# Patient Record
Sex: Female | Born: 1970 | Race: White | Hispanic: Yes | Smoking: Never smoker
Health system: Southern US, Community
[De-identification: ages and names within clinical notes are randomized; demographics above are authoritative.]

## PROBLEM LIST (undated history)

## (undated) DIAGNOSIS — E785 Hyperlipidemia, unspecified: Secondary | ICD-10-CM

## (undated) DIAGNOSIS — O1420 HELLP syndrome (HELLP), unspecified trimester: Secondary | ICD-10-CM

## (undated) DIAGNOSIS — F410 Panic disorder [episodic paroxysmal anxiety] without agoraphobia: Secondary | ICD-10-CM

## (undated) DIAGNOSIS — K859 Acute pancreatitis without necrosis or infection, unspecified: Secondary | ICD-10-CM

## (undated) DIAGNOSIS — A048 Other specified bacterial intestinal infections: Secondary | ICD-10-CM

## (undated) DIAGNOSIS — E039 Hypothyroidism, unspecified: Secondary | ICD-10-CM

## (undated) HISTORY — DX: Hypothyroidism, unspecified: E03.9

## (undated) HISTORY — DX: Panic disorder (episodic paroxysmal anxiety): F41.0

## (undated) HISTORY — DX: HELLP syndrome (HELLP), unspecified trimester: O14.20

## (undated) HISTORY — DX: Hyperlipidemia, unspecified: E78.5

## (undated) HISTORY — DX: Acute pancreatitis without necrosis or infection, unspecified: K85.90

---
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
## 1999-03-26 HISTORY — PX: APPENDECTOMY: SHX54

## 2005-07-31 ENCOUNTER — Other Ambulatory Visit: Admission: RE | Admit: 2005-07-31 | Discharge: 2005-07-31 | Payer: Self-pay | Admitting: Obstetrics and Gynecology

## 2005-08-14 ENCOUNTER — Inpatient Hospital Stay (HOSPITAL_COMMUNITY): Admission: AD | Admit: 2005-08-14 | Discharge: 2005-08-14 | Payer: Self-pay | Admitting: Obstetrics and Gynecology

## 2006-01-31 ENCOUNTER — Inpatient Hospital Stay (HOSPITAL_COMMUNITY): Admission: AD | Admit: 2006-01-31 | Discharge: 2006-02-06 | Payer: Self-pay | Admitting: Obstetrics and Gynecology

## 2006-02-01 ENCOUNTER — Encounter (INDEPENDENT_AMBULATORY_CARE_PROVIDER_SITE_OTHER): Payer: Self-pay | Admitting: Specialist

## 2006-02-07 ENCOUNTER — Encounter: Admission: RE | Admit: 2006-02-07 | Discharge: 2006-03-09 | Payer: Self-pay | Admitting: Obstetrics and Gynecology

## 2006-03-19 ENCOUNTER — Ambulatory Visit: Payer: Self-pay | Admitting: Family Medicine

## 2006-03-19 LAB — CONVERTED CEMR LAB
Albumin: 3.7 g/dL (ref 3.5–5.2)
Alkaline Phosphatase: 94 units/L (ref 39–117)
HCT: 35.7 % — ABNORMAL LOW (ref 36.0–46.0)
Hemoglobin: 12.8 g/dL (ref 12.0–15.0)
Lipase: 41 units/L (ref 11.0–59.0)
MCHC: 35.7 g/dL (ref 30.0–36.0)
MCV: 93.1 fL (ref 78.0–100.0)
Platelets: 392 10*3/uL (ref 150–400)
RBC: 3.83 M/uL — ABNORMAL LOW (ref 3.87–5.11)
RDW: 11.2 % — ABNORMAL LOW (ref 11.5–14.6)

## 2006-03-21 ENCOUNTER — Ambulatory Visit: Payer: Self-pay | Admitting: Internal Medicine

## 2006-04-08 ENCOUNTER — Encounter: Payer: Self-pay | Admitting: Internal Medicine

## 2006-04-28 ENCOUNTER — Encounter: Payer: Self-pay | Admitting: Internal Medicine

## 2006-07-28 ENCOUNTER — Encounter: Payer: Self-pay | Admitting: Internal Medicine

## 2006-10-28 DIAGNOSIS — E039 Hypothyroidism, unspecified: Secondary | ICD-10-CM | POA: Insufficient documentation

## 2006-12-05 ENCOUNTER — Ambulatory Visit: Payer: Self-pay | Admitting: Internal Medicine

## 2006-12-05 LAB — CONVERTED CEMR LAB
Blood in Urine, dipstick: NEGATIVE
Nitrite: NEGATIVE
Protein, U semiquant: NEGATIVE
Specific Gravity, Urine: 1.02
Urobilinogen, UA: NEGATIVE
WBC Urine, dipstick: NEGATIVE

## 2006-12-08 LAB — CONVERTED CEMR LAB
AST: 19 units/L (ref 0–37)
Albumin: 3.9 g/dL (ref 3.5–5.2)
Basophils Relative: 0.6 % (ref 0.0–1.0)
Bilirubin, Direct: 0.2 mg/dL (ref 0.0–0.3)
Cholesterol: 209 mg/dL (ref 0–200)
Eosinophils Absolute: 0.3 10*3/uL (ref 0.0–0.6)
GFR calc Af Amer: 121 mL/min
GFR calc non Af Amer: 100 mL/min
Hemoglobin: 12.7 g/dL (ref 12.0–15.0)
Monocytes Absolute: 0.7 10*3/uL (ref 0.2–0.7)
Neutro Abs: 6.2 10*3/uL (ref 1.4–7.7)
Neutrophils Relative %: 68.6 % (ref 43.0–77.0)
Platelets: 336 10*3/uL (ref 150–400)
Potassium: 5.2 meq/L — ABNORMAL HIGH (ref 3.5–5.1)
RDW: 11.4 % — ABNORMAL LOW (ref 11.5–14.6)
Sodium: 142 meq/L (ref 135–145)
Total Bilirubin: 1.2 mg/dL (ref 0.3–1.2)
Total CHOL/HDL Ratio: 6.5
Total Protein: 6.8 g/dL (ref 6.0–8.3)

## 2006-12-30 ENCOUNTER — Ambulatory Visit: Payer: Self-pay | Admitting: Family Medicine

## 2007-08-26 ENCOUNTER — Encounter: Payer: Self-pay | Admitting: Internal Medicine

## 2007-08-28 ENCOUNTER — Encounter: Admission: RE | Admit: 2007-08-28 | Discharge: 2007-08-28 | Payer: Self-pay | Admitting: Gastroenterology

## 2007-09-18 ENCOUNTER — Encounter: Payer: Self-pay | Admitting: Internal Medicine

## 2007-11-10 ENCOUNTER — Telehealth: Payer: Self-pay | Admitting: Internal Medicine

## 2007-12-11 ENCOUNTER — Ambulatory Visit: Payer: Self-pay | Admitting: Internal Medicine

## 2007-12-11 LAB — CONVERTED CEMR LAB
ALT: 13 units/L (ref 0–35)
Albumin: 3.9 g/dL (ref 3.5–5.2)
Alkaline Phosphatase: 48 units/L (ref 39–117)
Basophils Relative: 0.7 % (ref 0.0–3.0)
Bilirubin, Direct: 0.2 mg/dL (ref 0.0–0.3)
Cholesterol: 186 mg/dL (ref 0–200)
Creatinine, Ser: 0.7 mg/dL (ref 0.4–1.2)
Eosinophils Relative: 3.7 % (ref 0.0–5.0)
HCT: 34.6 % — ABNORMAL LOW (ref 36.0–46.0)
HDL: 40.5 mg/dL (ref >39.0)
Monocytes Relative: 7.9 % (ref 3.0–12.0)
Neutro Abs: 4.9 10*3/uL (ref 1.4–7.7)
Nitrite: NEGATIVE
Platelets: 286 10*3/uL (ref 150–400)
Potassium: 4.3 meq/L (ref 3.5–5.1)
RBC: 3.77 M/uL — ABNORMAL LOW (ref 3.87–5.11)
RDW: 11.3 % — ABNORMAL LOW (ref 11.5–14.6)
Sodium: 142 meq/L (ref 135–145)
TSH: 1.85 microintl units/mL (ref 0.35–5.50)
Total Protein: 7.1 g/dL (ref 6.0–8.3)
Triglycerides: 56 mg/dL (ref 0–149)
Urobilinogen, UA: 0.2
VLDL: 11 mg/dL (ref 0–40)
WBC Urine, dipstick: NEGATIVE

## 2007-12-18 ENCOUNTER — Ambulatory Visit: Payer: Self-pay | Admitting: Internal Medicine

## 2007-12-18 DIAGNOSIS — Z9189 Other specified personal risk factors, not elsewhere classified: Secondary | ICD-10-CM

## 2007-12-18 DIAGNOSIS — L819 Disorder of pigmentation, unspecified: Secondary | ICD-10-CM

## 2008-03-28 ENCOUNTER — Telehealth: Payer: Self-pay | Admitting: Internal Medicine

## 2008-03-30 ENCOUNTER — Ambulatory Visit: Payer: Self-pay | Admitting: Internal Medicine

## 2008-03-30 DIAGNOSIS — M542 Cervicalgia: Secondary | ICD-10-CM | POA: Insufficient documentation

## 2008-03-30 DIAGNOSIS — F410 Panic disorder [episodic paroxysmal anxiety] without agoraphobia: Secondary | ICD-10-CM

## 2008-04-27 ENCOUNTER — Ambulatory Visit: Payer: Self-pay | Admitting: Internal Medicine

## 2008-05-17 ENCOUNTER — Ambulatory Visit: Payer: Self-pay | Admitting: Internal Medicine

## 2008-07-28 ENCOUNTER — Ambulatory Visit: Payer: Self-pay | Admitting: Internal Medicine

## 2008-08-05 LAB — CONVERTED CEMR LAB: Free T4: 0.7 ng/dL (ref 0.6–1.6)

## 2008-08-25 ENCOUNTER — Ambulatory Visit: Payer: Self-pay | Admitting: Internal Medicine

## 2008-08-26 ENCOUNTER — Encounter: Payer: Self-pay | Admitting: Internal Medicine

## 2009-01-09 ENCOUNTER — Ambulatory Visit: Payer: Self-pay | Admitting: Internal Medicine

## 2009-07-27 ENCOUNTER — Ambulatory Visit: Payer: Self-pay | Admitting: Internal Medicine

## 2009-07-27 DIAGNOSIS — M19049 Primary osteoarthritis, unspecified hand: Secondary | ICD-10-CM | POA: Insufficient documentation

## 2009-07-27 DIAGNOSIS — M549 Dorsalgia, unspecified: Secondary | ICD-10-CM | POA: Insufficient documentation

## 2009-07-27 LAB — CONVERTED CEMR LAB: Anti Nuclear Antibody(ANA): NEGATIVE

## 2009-07-28 ENCOUNTER — Ambulatory Visit: Payer: Self-pay | Admitting: Internal Medicine

## 2009-08-04 ENCOUNTER — Telehealth: Payer: Self-pay | Admitting: *Deleted

## 2009-08-04 LAB — CONVERTED CEMR LAB
ALT: 14 units/L (ref 0–35)
Albumin: 4.1 g/dL (ref 3.5–5.2)
Alkaline Phosphatase: 62 units/L (ref 39–117)
Basophils Absolute: 0 10*3/uL (ref 0.0–0.1)
Bilirubin, Direct: 0.1 mg/dL (ref 0.0–0.3)
Chloride: 104 meq/L (ref 96–112)
Eosinophils Absolute: 0.2 10*3/uL (ref 0.0–0.7)
Eosinophils Relative: 2.2 % (ref 0.0–5.0)
Lymphs Abs: 1.7 10*3/uL (ref 0.7–4.0)
Monocytes Absolute: 0.6 10*3/uL (ref 0.1–1.0)
Neutro Abs: 6.6 10*3/uL (ref 1.4–7.7)
Potassium: 4.8 meq/L (ref 3.5–5.1)
RBC: 4.06 M/uL (ref 3.87–5.11)
RDW: 12.5 % (ref 11.5–14.6)
Rhuematoid fact SerPl-aCnc: 23.9 intl units/mL — ABNORMAL HIGH (ref 0.0–20.0)
Total Protein: 7.3 g/dL (ref 6.0–8.3)

## 2009-09-18 ENCOUNTER — Ambulatory Visit: Payer: Self-pay | Admitting: Internal Medicine

## 2009-09-18 LAB — CONVERTED CEMR LAB
AST: 23 units/L (ref 0–37)
Alkaline Phosphatase: 56 units/L (ref 39–117)
BUN: 13 mg/dL (ref 6–23)
Basophils Relative: 0.5 % (ref 0.0–3.0)
Bilirubin Urine: NEGATIVE
Bilirubin, Direct: 0.1 mg/dL (ref 0.0–0.3)
Blood in Urine, dipstick: NEGATIVE
CO2: 27 meq/L (ref 19–32)
Chloride: 106 meq/L (ref 96–112)
Cholesterol: 222 mg/dL — ABNORMAL HIGH (ref 0–200)
GFR calc non Af Amer: 111.26 mL/min (ref >60)
Glucose, Urine, Semiquant: NEGATIVE
HCT: 36.1 % (ref 36.0–46.0)
Hemoglobin: 12.7 g/dL (ref 12.0–15.0)
Ketones, urine, test strip: NEGATIVE
Lymphs Abs: 2 10*3/uL (ref 0.7–4.0)
Monocytes Absolute: 0.7 10*3/uL (ref 0.1–1.0)
Monocytes Relative: 9.1 % (ref 3.0–12.0)
Neutrophils Relative %: 64 % (ref 43.0–77.0)
Potassium: 4.7 meq/L (ref 3.5–5.1)
Protein, U semiquant: NEGATIVE
Total CHOL/HDL Ratio: 5
Total Protein: 7.1 g/dL (ref 6.0–8.3)
VLDL: 21.6 mg/dL (ref 0.0–40.0)
WBC: 8.1 10*3/uL (ref 4.5–10.5)

## 2009-09-27 ENCOUNTER — Ambulatory Visit: Payer: Self-pay | Admitting: Internal Medicine

## 2009-09-27 DIAGNOSIS — E785 Hyperlipidemia, unspecified: Secondary | ICD-10-CM | POA: Insufficient documentation

## 2010-01-18 ENCOUNTER — Ambulatory Visit: Payer: Self-pay | Admitting: Internal Medicine

## 2010-01-18 DIAGNOSIS — J039 Acute tonsillitis, unspecified: Secondary | ICD-10-CM | POA: Insufficient documentation

## 2010-01-18 DIAGNOSIS — J029 Acute pharyngitis, unspecified: Secondary | ICD-10-CM | POA: Insufficient documentation

## 2010-01-19 ENCOUNTER — Encounter: Payer: Self-pay | Admitting: Internal Medicine

## 2010-01-23 ENCOUNTER — Ambulatory Visit: Payer: Self-pay | Admitting: Internal Medicine

## 2010-01-29 LAB — CONVERTED CEMR LAB
Basophils Relative: 0.4 % (ref 0.0–3.0)
HCT: 35 % — ABNORMAL LOW (ref 36.0–46.0)
HDL: 41.4 mg/dL (ref >39.00)
Hemoglobin: 12 g/dL (ref 12.0–15.0)
Lymphocytes Relative: 11.1 % — ABNORMAL LOW (ref 12.0–46.0)
Monocytes Absolute: 0.8 10*3/uL (ref 0.1–1.0)
Neutro Abs: 10.2 10*3/uL — ABNORMAL HIGH (ref 1.4–7.7)
Neutrophils Relative %: 80.7 % — ABNORMAL HIGH (ref 43.0–77.0)
Platelets: 310 10*3/uL (ref 150.0–400.0)
RBC: 3.82 M/uL — ABNORMAL LOW (ref 3.87–5.11)
TSH: 2.45 microintl units/mL (ref 0.35–5.50)
Total CHOL/HDL Ratio: 5
VLDL: 20.4 mg/dL (ref 0.0–40.0)

## 2010-04-24 NOTE — Progress Notes (Signed)
Summary: Returning Call  Phone Note Call from Patient Call back at Work Phone [phone]   Caller: Patient Summary of Call: Returning phone call,  can you please call my husband and talk to him. Initial call taken by: Trixie Dredge,  Aug 04, 2009 10:48 AM  Follow-up for Phone Call        Pt's husband aware of results.  Follow-up by: Romualdo Bolk, CMA (AAMA),  Aug 04, 2009 2:20 PM

## 2010-04-24 NOTE — Assessment & Plan Note (Signed)
Summary: SORE THROAT // RS   Vital Signs:  Patient profile:   40 year old female Menstrual status:  regular LMP:     01/04/2010 Weight:      138 pounds Temp:     99.4 degrees F oral Pulse rate:   60 / minute BP sitting:   100 / 70  (left arm) Cuff size:   regular  Vitals Entered By: Romualdo Bolk, CMA (AAMA) (January 18, 2010 10:44 AM) CC: Sore throat, hurts to swollow, bilateral ear pain, ha, coughing and congestion, no fever. This started on 10/26. LMP (date): 01/04/2010 LMP - Character: normal Menarche (age onset years): 13   Menses interval (days): 28 Menstrual flow (days): 2-3 Enter LMP: 01/04/2010   History of Present Illness: Karen Fleming   comes in today  for  acute visit  for above.  onset about 24 hours or less  sore throat that is hard to swallow and  very painful  with some nasal congestion but no sig cough.  Children at home runny nose no strep exposure .  No abd pain rash  . took advil some help.  Due for thyroid and lipid check   . Need refill lexapro.    taking 5 every other day and helping  but wants to stay on this for a while.  no panic attack   currently  and no need for rescue med.   Preventive Screening-Counseling & Management  Alcohol-Tobacco     Alcohol drinks/day: 0     Smoking Status: never  Caffeine-Diet-Exercise     Caffeine use/day: none     Does Patient Exercise: yes     Type of exercise: swim, run     Times/week: 3  Current Medications (verified): 1)  Levothyroxine Sodium 50 Mcg  Tabs (Levothyroxine Sodium) .... Take 1 Tablet By Mouth Once A Day  Taking Every Other Day  7 2011 2)  Lexapro 10 Mg Tabs (Escitalopram Oxalate) .... 1/2 Every Other Day  Allergies (verified): No Known Drug Allergies  Past History:  Past medical, surgical, family and social histories (including risk factors) reviewed, and no changes noted (except as noted below).  Past Medical History: Reviewed history from 05/17/2008 and no changes  required. Hypothyroidism Hellp  in pregnancy   c section 11/10 endoscopy 04 reflux esophagitis Hx panic attacks on meds pre pregnancy   CONSULTANTS  [facility]  eval for  ruq pain    [facility]   Past Surgical History: Reviewed history from 05/17/2008 and no changes required. Appendectomy  2001 pancreatitis 2000, 2005    Past History:  Care Management: Gynecology: [facility] G[facility]ology: [facility] Psychologist: Seen but doesn't work for her-unsure of the name of the person  Family History: Reviewed history from 09/27/2009 and no changes required. gm and gf    DM  HBP Parent well  bros and sis 4 well.  GP  with arthritis ? OA    Father  Mi 74     Social History: Reviewed history from 07/27/2009 and no changes required. hh of 3    young child  at home no pets no ets. no caffeine    or etoh. drink milk 2-3 x perday  no reg exercise. [place]    Review of Systems       The patient complains of anorexia.  The patient denies decreased hearing, hoarseness, chest pain, peripheral edema, headaches, abdominal pain, abnormal bleeding, and angioedema.         achy myalgias   Physical  Exam  General:  mildy ill in nad   Head:  Normocephalic and atraumatic without obvious abnormalities. No apparent alopecia or balding. Eyes:  vision grossly intact, pupils equal, and pupils round.   Ears:  R ear normal, L ear normal, and no external deformities.   Nose:  no external deformity and no external erythema.  congested no face pain Mouth:  tonsil 1 + tiny exudate left  milderythema an no edema  Neck:  1+ ac nodes tender shoddy pc  Lungs:  Normal respiratory effort, chest expands symmetrically. Lungs are clear to auscultation, no crackles or wheezes. Heart:  Normal rate and regular rhythm. S1 and S2 normal without gallop, murmur, click, rub or other extra sounds. Pulses:  nl cap refill  Neurologic:  non focal  Skin:  turgor normal, color normal, no ecchymoses, and no  petechiae.   Cervical Nodes:  shoddy pc 1+ ac and tender Psych:  Oriented X3, normally interactive, good eye contact, not anxious appearing, and not depressed appearing.     Impression & Recommendations:  Problem # 1:  ACUTE TONSILLITIS (ICD-463) with congestion     probably viral but need to R/O strep by context and exam.   consider mono if persistent or  progressive    Problem # 2:  PANIC DISORDER (ICD-300.01) Assessment: Improved on every other day meds and want to stay on this for now and doing well   .    wil continue    Lexapro 10 Mg Tabs (Escitalopram oxalate) .Marland Kitchen... 1/2 every other day  Problem # 3:  HYPOTHYROIDISM (ICD-244.9) due for labs  Her updated medication list for this problem includes:    Levothyroxine Sodium 50 Mcg Tabs (Levothyroxine sodium) .Marland Kitchen... Take 1 tablet by mouth once a day  taking every other day  7 2011  Labs Reviewed: TSH: 2.22 (09/18/2009)    Chol: 222 (09/18/2009)   HDL: 47.80 (09/18/2009)   LDL: 134 (12/11/2007)   TG: 108.0 (09/18/2009)  Problem # 4:  HYPERLIPIDEMIA (ICD-272.4)  lifestyle intervention  repeat  Labs Reviewed: SGOT: 23 (09/18/2009)   SGPT: 13 (09/18/2009)   HDL:47.80 (09/18/2009), 40.5 (12/11/2007)  LDL:134 (12/11/2007), DEL (12/05/2006)  Chol:222 (09/18/2009), 186 (12/11/2007)  Trig:108.0 (09/18/2009), 56 (12/11/2007)  Complete Medication List: 1)  Levothyroxine Sodium 50 Mcg Tabs (Levothyroxine sodium) .... Take 1 tablet by mouth once a day  taking every other day  7 2011 2)  Lexapro 10 Mg Tabs (Escitalopram oxalate) .... 1/2 every other day 3)  Lorazepam 0.5 Mg Tabs (Lorazepam) .Marland Kitchen.. 1-2 to 1 by mouth two times a day for anxiety  Other Orders: Rapid Strep (16109) T-Culture, Throat ([id number])  Patient Instructions: 1)  You will be informed of lab results when available.  2)  use advil or tylenol in the meantime for signs  3)  If your strep test is positive we will start antibiotic.  4)  Fasting lipids and TSH     cbcdiff     dx   elevated lipids and hypothyroid  and tonsillitis  Prescriptions: LEXAPRO 10 MG TABS (ESCITALOPRAM OXALATE) 1/2 every other day  #30 x 1   Entered and Authorized by:   Madelin Headings MD   Signed by:   Madelin Headings MD on 01/18/2010   Method used:   Electronically to        [facility]  [place] [address] (retail)       [address]       [place], [place]  [address]  Ph: [phone] or [phone]       Fax: [phone]   RxID:   [id number] LORAZEPAM 0.5 MG TABS (LORAZEPAM) 1-2 to 1 by mouth two times a day for anxiety  #30 x 0   Entered and Authorized by:   Madelin Headings MD   Signed by:   Madelin Headings MD on 01/18/2010   Method used:   Print then Give to Patient   RxID:   [id number]   The lorazepam as above was written in error and not given to the patient . shredded. Madelin Headings MD  January 18, 2010 11:31 AM  Orders Added: 1)  Rapid Strep [87880] 2)  T-Culture, Throat [[id number]] 3)  Est. Patient Level IV [32951]    Laboratory Results    Other Tests  Rapid Strep: negative Comments: Rita Ohara  January 18, 2010 11:08 AM   Kit Test Internal QC: Negative   (Normal Range: Negative)

## 2010-04-24 NOTE — Letter (Signed)
Summary: [facility]   Imported By: Maryln Gottron 09/14/2009 15:18:10  _____________________________________________________________________  External Attachment:    Type:   Image     Comment:   External Document

## 2010-04-24 NOTE — Assessment & Plan Note (Signed)
Summary: med check/refill/cjr   Vital Signs:  Patient profile:   40 year old female Menstrual status:  regular LMP:     07/11/2009 Weight:      138 pounds Pulse rate:   66 / minute BP sitting:   100 / 60  (left arm) Cuff size:   regular  Vitals Entered By: Romualdo Bolk, CMA (AAMA) (Jul 27, 2009 9:49 AM) CC: Follow-up visit on meds and pt also wants to discuss joints in both hands hurt and left index finger is swollen LMP (date): 07/11/2009 LMP - Character: normal Menarche (age onset years): 13   Menses interval (days): 28 Menstrual flow (days): 2-3 Enter LMP: 07/11/2009   History of Present Illness: Karen Fleming comesin comes in today  for follow up of medical problems. # Anxiety panic : doing rather well  has decreased to 5 mg for about amonth.   slept better a t lower dose   #   Low back pan at night and not in day and no assoicated sx  for months.  Also for the past month or so she has had pain  and stiffness in fingers and  more on left index finger with some swelling.  No meds for this.  # thyroid : no change in meds doing well . no hair changes . exercises regularly  at gym running on treadmill.    Preventive Screening-Counseling & Management  Alcohol-Tobacco     Alcohol drinks/day: 0     Smoking Status: never  Caffeine-Diet-Exercise     Caffeine use/day: 0     Does Patient Exercise: yes  Current Medications (verified): 1)  Levothyroxine Sodium 50 Mcg  Tabs (Levothyroxine Sodium) .... Take 1 Tablet By Mouth Once A Day 2)  Lexapro 10 Mg Tabs (Escitalopram Oxalate) .Marland Kitchen.. 1 By Mouth Once Daily  or As Directed  Allergies (verified): No Known Drug Allergies  Past History:  Past medical, surgical, family and social histories (including risk factors) reviewed, and no changes noted (except as noted below).  Past Medical History: Reviewed history from 05/17/2008 and no changes required. Hypothyroidism Hellp  in pregnancy   c section 11/10 endoscopy 04  reflux esophagitis Hx panic attacks on meds pre pregnancy   CONSULTANTS  [facility]  eval for  ruq pain    [facility]   Past Surgical History: Reviewed history from 05/17/2008 and no changes required. Appendectomy  2001 pancreatitis 2000, 2005    Past History:  Care Management: Gynecology: [facility]ology: [facility] Psychologist: Seen but doesn't work for her-unsure of the name of the person  Family History: Reviewed history from 05/17/2008 and no changes required. gm and gf    DM  HBP Parent well  bros and sis 4 well.  GP  with arthritis ? OA   Social History: Reviewed history from 05/17/2008 and no changes required. hh of 3    young child  at home no pets no ets. no caffeine    or etoh. drink milk 2-3 x perday  no reg exercise. [place]    Review of Systems  The patient denies anorexia, fever, weight loss, weight gain, vision loss, decreased hearing, hoarseness, chest pain, syncope, prolonged cough, abdominal pain, melena, muscle weakness, transient blindness, difficulty walking, depression, abnormal bleeding, enlarged lymph nodes, and angioedema.    Physical Exam  General:  Well-developed,well-nourished,in no acute distress; alert,appropriate and cooperative throughout examination Head:  normocephalic and atraumatic.   Eyes:  vision grossly intact, pupils equal, and pupils round.  glasses  Ears:  R ear normal and L ear normal.   Mouth:  pharynx pink and moist.  tongue midline Neck:  No deformities, masses, or tenderness noted. Lungs:  Normal respiratory effort, chest expands symmetrically. Lungs are clear to auscultation, no crackles or wheezes. Heart:  Normal rate and regular rhythm. S1 and S2 normal without gallop, murmur, click, rub or other extra sounds. Abdomen:  Bowel sounds positive,abdomen soft and non-tender without masses, organomegaly or  noted. Msk:  no joint warmth and no redness over joints.  see below.  Pulses:  pulses intact without  delay   Extremities:  lef t hand with swelling left index distal  with / nodule  ? faint redness at  base of nails  but no rash and no warmth or effustion Neurologic:  alert & oriented X3, strength normal in all extremities, and gait normal.   Skin:  turgor normal, color normal, no ecchymoses, no petechiae, and no purpura.   Cervical Nodes:  No lymphadenopathy noted Psych:  Oriented X3, normally interactive, good eye contact, not anxious appearing, and not depressed appearing.     Impression & Recommendations:  Problem # 1:  HYPOTHYROIDISM (ICD-244.9)  Her updated medication list for this problem includes:    Levothyroxine Sodium 50 Mcg Tabs (Levothyroxine sodium) .Marland Kitchen... 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 early OA   young for this but some  type of arthritis  runs in family   Orders: TLB-CBC Platelet - w/Differential (85025-CBCD) TLB-CRP-High Sensitivity (C-Reactive Protein) (86140-FCRP) T-Antinuclear Antib (ANA) (30865-78469) TLB-Rheumatoid Factor (RA) (62952-WU) T-HLA B27 (83890/83898-83410) TLB-BMP (Basic Metabolic Panel-BMET) (80048-METABOL) TLB-Hepatic/Liver Function Pnl (80076-HEPATIC) T-Hand Right 3 views (73130TC) T-Hand Left 3 Views (73130TC) T-Lumbar Spine Complete, 5 Views (71110TC) Sedimentation Rate, non-automated (13244) Venipuncture (01027)  Problem # 3:  BACK PAIN (ICD-724.5) only nocturnal without other   signs except hand signs   .    no radiation   No colitis   . will continue to exercise  Orders: TLB-CBC Platelet - w/Differential (85025-CBCD) TLB-CRP-High Sensitivity (C-Reactive Protein) (86140-FCRP) T-Antinuclear Antib (ANA) (25366-44034) TLB-Rheumatoid Factor (RA) (74259-DG) T-HLA B27 (83890/83898-83410) TLB-BMP (Basic Metabolic Panel-BMET) (80048-METABOL) TLB-Hepatic/Liver Function Pnl (80076-HEPATIC) T-Lumbar Spine  Complete, 5 Views (71110TC) Sedimentation Rate, non-automated (38756) Venipuncture (43329)  Problem # 4:  PANIC DISORDER (ICD-300.01) Assessment: Improved ok to stay on weaned dose.   5 mg for a while and then can alterndate dose  Her updated medication list for this problem includes:    Lexapro 10 Mg Tabs (Escitalopram oxalate) .Marland Kitchen... 1 by mouth once daily  or as directed  Complete Medication List: 1)  Levothyroxine Sodium 50 Mcg Tabs (Levothyroxine sodium) .... Take 1 tablet by mouth once a day 2)  Lexapro 10 Mg Tabs (Escitalopram oxalate) .Marland Kitchen.. 1 by mouth once daily  or as directed  Patient Instructions: 1)  Stay on lexapro 5 mg  daily  for the next month. 2)  then can try alternating none with 5 mg   for a month and can try stopping. If anxiety recurrs then go back to 5 mg per day   . 3)  Get x rays done.     4)  You will be informed of lab results when available.  5)  can take ibuprofen 400-600 every 8 hours if needed for pain . 6)  if persistent or  progressive   over the next 2 months  then  call for rheumatologic assessment  referral .  Laboratory Results   Blood Tests   Date/Time Recieved: Jul 27, 2009 11:19 AM  Date/Time Reported: Jul 27, 2009 11:19 AM   SED rate: 22  Comments: Wynona Canes, CMA  Jul 27, 2009 11:19 AM

## 2010-04-24 NOTE — Assessment & Plan Note (Signed)
Summary: cpx/cjr   Vital Signs:  Patient profile:   40 year old female Menstrual status:  regular Height:      62 inches Weight:      139 pounds BMI:     25.52 BP sitting:   94 / 60  (left arm) Cuff size:   regular  Vitals Entered By: Raechel Ache, RN (September 27, 2009 9:33 AM) CC: CPX, labs done. Sees gyn.   History of Present Illness: Shannelle Alguire comes in today  for preventive visit . Since last visit  here  there have been no major changes in health status  . i doing well and no new co. Thyroid: taking med every other day because hard to remember otherwise . Mood: doing well and weaning on 5 mg every other day for now. Has Gynhe does PAPs  no concerns   Preventive Screening-Counseling & Management  Alcohol-Tobacco     Alcohol drinks/day: 0     Smoking Status: never  Caffeine-Diet-Exercise     Caffeine use/day: none     Does Patient Exercise: yes     Type of exercise: swim, run     Times/week: 3  Hep-HIV-STD-Contraception     Dental Visit-last 6 months yes     Sun Exposure-Excessive: no  Safety-Violence-Falls     Seat Belt Use: yes     Firearms in the Home: firearms in the home     Firearm Counseling: not indicated; uses recommended firearm safety measures     Smoke Detectors: yes      Blood Transfusions:  no.    Allergies: No Known Drug Allergies  Past History:  Past medical, surgical, family and social histories (including risk factors) reviewed, and no changes noted (except as noted below).  Past Medical History: Reviewed history from 05/17/2008 and no changes required. Hypothyroidism Hellp  in pregnancy   c section 11/10 endoscopy 04 reflux esophagitis Hx panic attacks on meds pre pregnancy   CONSULTANTS  [facility]  eval for  ruq pain    [facility]   Past Surgical History: Reviewed history from 05/17/2008 and no changes required. Appendectomy  2001 pancreatitis 2000, 2005    Family History: Reviewed history from 07/27/2009 and no  changes required. gm and gf    DM  HBP Parent well  bros and sis 4 well.  GP  with arthritis ? OA    Father  Mi 53     Social History: Reviewed history from 07/27/2009 and no changes required. hh of 3    young child  at home no pets no ets. no caffeine    or etoh. drink milk 2-3 x perday  no reg exercise. [place]  Caffeine use/day:  none Seat Belt Use:  yes Dental Care w/in 6 mos.:  yes Sun Exposure-Excessive:  no Blood Transfusions:  no  Review of Systems  The patient denies anorexia, fever, weight loss, weight gain, vision loss, decreased hearing, hoarseness, chest pain, syncope, dyspnea on exertion, peripheral edema, prolonged cough, headaches, hemoptysis, abdominal pain, melena, hematochezia, severe indigestion/heartburn, hematuria, incontinence, muscle weakness, suspicious skin lesions, transient blindness, difficulty walking, depression, unusual weight change, abnormal bleeding, enlarged lymph nodes, angioedema, and breast masses.         wears glasses   Physical Exam  General:  Well-developed,well-nourished,in no acute distress; alert,appropriate and cooperative throughout examination Head:  normocephalic and atraumatic.   Eyes:  vision grossly intact, pupils equal, and pupils round.  glasses Ears:  R ear normal and L ear  normal.  no external deformities.   Nose:  no external deformity, no external erythema, and no nasal discharge.   Mouth:  pharynx pink and moist.  tongue midline Neck:  No deformities, masses, or tenderness noted.  thyroid palpablew Chest Wall:  No deformities, masses, or tenderness noted. Breasts:  No mass, nodules, thickening, tenderness, bulging, retraction, inflamation, nipple discharge or skin changes noted.   Lungs:  Normal respiratory effort, chest expands symmetrically. Lungs are clear to auscultation, no crackles or wheezes. Heart:  Normal rate and regular rhythm. S1 and S2 normal without gallop, murmur, click, rub or other extra  sounds. Abdomen:  Bowel sounds positive,abdomen soft and non-tender without masses, organomegaly or  noted. Genitalia:  per gyne Msk:  no joint tenderness, no joint swelling, no joint warmth, and no redness over joints.   Pulses:  pulses intact without delay   Extremities:  no clubbing cyanosis or edema  Neurologic:  alert & oriented X3, cranial nerves IIi-XII intact, and strength normal in all extremities.  alert & oriented X3, cranial nerves II-XII intact, strength normal in all extremities, gait normal, and DTRs symmetrical and normal.   Skin:  turgor normal, color normal, no ecchymoses, no petechiae, and no purpura.   Cervical Nodes:  No lymphadenopathy noted Axillary Nodes:  No palpable lymphadenopathy Inguinal Nodes:  No significant adenopathy Psych:  Oriented X3, normally interactive, good eye contact, not anxious appearing, and not depressed appearing.    Pt is A&Ox3,affect,speech,memory,attention,&motor skills appear intact.    Impression & Recommendations:  Problem # 1:  WELL ADULT EXAM (ICD-V70.0) Discussed nutrition,exercise,diet,healthy weight, vitamin D and calcium.   UTD on prevention parameters  Problem # 2:  HYPOTHYROIDISM (ICD-244.9)  taking med every other day and seems to be doing ok   will recheck level in a few months   Her updated medication list for this problem includes:    Levothyroxine Sodium 50 Mcg Tabs (Levothyroxine sodium) .Marland Kitchen... Take 1 tablet by mouth once a day  taking every other day  7 2011  Labs Reviewed: TSH: 2.22 (09/18/2009)    Chol: 222 (09/18/2009)   HDL: 47.80 (09/18/2009)   LDL: 134 (12/11/2007)   TG: 108.0 (09/18/2009)  Problem # 3:  HYPERLIPIDEMIA (ICD-272.4) disc lifestyle intervention  and counseled .    chan recheck at next blood test aftrer intervention.  no other risk factors  currently .  Labs Reviewed: SGOT: 23 (09/18/2009)   SGPT: 13 (09/18/2009)   HDL:47.80 (09/18/2009), 40.5 (12/11/2007)  LDL:134 (12/11/2007), DEL (12/05/2006)   Chol:222 (09/18/2009), 186 (12/11/2007)  Trig:108.0 (09/18/2009), 56 (12/11/2007)  Problem # 4:  PANIC DISORDER (ICD-300.01) Assessment: Improved weaning and stable Her updated medication list for this problem includes:    Lexapro 10 Mg Tabs (Escitalopram oxalate) .Marland Kitchen... 1/2 every other day  Complete Medication List: 1)  Levothyroxine Sodium 50 Mcg Tabs (Levothyroxine sodium) .... Take 1 tablet by mouth once a day  taking every other day  7 2011 2)  Lexapro 10 Mg Tabs (Escitalopram oxalate) .... 1/2 every other day  Patient Instructions: 1)  exercise reguarly at least 20 minutes 5 times a week. If you develop chest pain, have severe difficulty breathing, or feel very tired, stop exercising immediately and seek medical attention.  2)    3)  Rec heart healthy [place] type diet avoiding fried foods animal satuated and trans fats ,  .  Limit simple sugars and starches.Marland Kitchen   4)  TSH, LIPIDS   in 4 months .  244.9 If it  is ok then  we can see you yearly  with labs .

## 2010-05-15 ENCOUNTER — Other Ambulatory Visit (HOSPITAL_COMMUNITY): Payer: Self-pay | Admitting: Obstetrics and Gynecology

## 2010-05-15 DIAGNOSIS — Z1231 Encounter for screening mammogram for malignant neoplasm of breast: Secondary | ICD-10-CM

## 2010-05-29 ENCOUNTER — Ambulatory Visit (HOSPITAL_COMMUNITY)
Admission: RE | Admit: 2010-05-29 | Discharge: 2010-05-29 | Disposition: A | Discharge status: Home / Self Care | Payer: BC Managed Care – PPO | Source: Ambulatory Visit | Attending: Obstetrics and Gynecology | Admitting: Obstetrics and Gynecology

## 2010-05-29 DIAGNOSIS — Z1231 Encounter for screening mammogram for malignant neoplasm of breast: Secondary | ICD-10-CM | POA: Insufficient documentation

## 2010-08-10 NOTE — Op Note (Signed)
Karen Fleming, Karen Fleming             ACCOUNT NO.:  [id number]   MEDICAL RECORD NO.:  [id number]          PATIENT TYPE:  INP   LOCATION:  9157                          FACILITY:  [facility]   PHYSICIAN:  Hal Morales, M.D.DATE OF BIRTH:  27-Nov-1970   DATE OF PROCEDURE:  02/01/2006  DATE OF DISCHARGE:                               OPERATIVE REPORT   PREOPERATIVE DIAGNOSES:  Intrauterine pregnancy at 36 weeks 4 days,  HELLP syndrome.   POSTOPERATIVE DIAGNOSES:  Intrauterine pregnancy at 36 weeks 4 days,  HELLP syndrome.   OPERATION:  Primary low transverse cesarean section.   SURGEON:  Hal Morales, M.D.   FIRST ASSISTANT:  Renaldo Reel. Emilee Hero, C.N.M.   ANESTHESIA:  Spinal.   ESTIMATED BLOOD LOSS:  750 mL.   COMPLICATIONS:  None.   FINDINGS:  The patient was delivered of a female infant named [name],  weighing 5 pounds with Apgars of 6, 7, and 9, at 1, 5, and 10 minutes  respectively.  He did require Narcan resuscitation.  The uterus, tubes  and ovaries were normal for the gravid state.  The placenta was sent to  pathology.   PROCEDURE:  The patient was taken to the operating room after  appropriate identification and placed on the operating table.  After the  placement of a spinal anesthetic, she was placed in the supine position  with a left lateral tilt.  The abdomen and perineum were prepped with  multiple layers of Betadine and a Foley catheter inserted into the  bladder and connected to straight drainage.  The abdomen was draped as a  sterile field.  After assuring adequate anesthesia, the suprapubic  region was infiltrated with 20 mL of 0.25% Marcaine.  A suprapubic  incision was made and the abdomen opened in layers.  The peritoneum was  entered and the bladder blade was placed.  The uterus was incised  approximately 2 cm above the uterovesical fold and that incision taken  laterally on either side bluntly.  The infant was delivered from the  occiput transverse position  and after having the nares and pharynx  suctioned, the cord clamped and cut, was handed off to the awaiting  pediatricians.  The placenta was noted to have been separated from the  uterus and was removed from the operative field.  The appropriate cord  blood was drawn.  The cervix was dilated.  The uterine incision was  closed with a running interlocking suture of 0 Vicryl.  An imbricating  suture of 0 Vicryl was placed with adequate hemostasis.  Copious  irrigation was carried out.  The abdominal peritoneum was closed with a  running suture of 2-0 Vicryl.  The rectus muscles were reapproximated in  the midline with a figure-of-eight suture of 2-0 Vicryl.  The rectus  fascia was closed with a running suture of 0 Vicryl and reinforced on  either side of the midline with figure-of-eight sutures of 0 Vicryl.  The subcutaneous tissues were made hemostatic with Bovie cautery and  irrigated.  The skin incisions were closed with a subcuticular suture of  3-0 Monocryl.  A sterile  dressing was applied.  The patient was taken from the operating room to  the recovery room in satisfactory condition having tolerated the  procedure well with sponge and instrument counts correct.   SPECIMENS TO PATHOLOGY:  Placenta.      Hal Morales, M.D.  Electronically Signed     VPH/MEDQ  D:  02/01/2006  T:  02/01/2006  Job:  [id number]

## 2010-08-10 NOTE — Discharge Summary (Signed)
NAMESHYLYN, Karen Fleming             ACCOUNT NO.:  [id number]   MEDICAL RECORD NO.:  [id number]          PATIENT TYPE:  INP   LOCATION:  9105                          FACILITY:  [facility]   PHYSICIAN:  Hal Morales, M.D.DATE OF BIRTH:  05-12-1970   DATE OF ADMISSION:  01/31/2006  DATE OF DISCHARGE:  02/06/2006                               DISCHARGE SUMMARY   ADMISSION DIAGNOSES:  1. Intrauterine pregnancy at 36-3/7 weeks.  2. Severe headache.  3. Elevated blood pressure.  4. History of pancreatitis, probable acute exacerbation.   DISCHARGE DIAGNOSES:  1. Intrauterine pregnancy at 36-3/7 weeks, delivered.  2. Severe headache, resolved.  3. Elevated blood pressure, improved.  4. Pregnancy-induced hypertension.  5. Hemolysis, elevated liver enzymes, and low platelet count syndrome.  6. Status post low transverse cesarean section for a viable female      infant named [name], weighed 5 pounds, with Apgars 6, 7 and 9.  7. Improvement in hemolysis, elevated liver enzymes, and low platelet      count syndrome.  8. History of pancreatitis, probable acute exacerbation.   HOSPITAL PROCEDURES:  1. Electronic fetal monitoring.  2. Magnesium sulfate administration.  3. Primary low transverse cesarean section.  4. Spinal anesthesia.   HOSPITAL COURSE:  The patient was admitted with severe bilateral frontal  headache.  She did not have any visual changes.  She did complain of  epigastric pain and some cramping.  Her blood pressure was elevated on  admission at 181/99.  Fetal heart rate was reassuring.  She had low  platelets at 101 and SGOT of 40 and SGPT of 23 with a uric acid of 4.9.  She was admitted, given some analgesia for her headache and given some  labetalol for her blood pressure control and placed on bed rest in the  hospital.  Blood pressure improved somewhat with medications.  The next  day on February 01, 2006, her SGOT rose to 491, SGPT rose to 272, and a  decision was made at  that point to proceed with cesarean delivery due to  the rapid lab deterioration.  She had a primary low transverse cesarean  section under spinal anesthesia for a viable female infant named [name],  weighing 5 pounds, Apgars 7, 7 and 9.  She had no complications from the  surgery, and EBL was 750 mL.  She was taken to recovery and then to AICU  observation and care.  She had some diuresis on postop day #1 and her  SGOT dropped to 188, SGPT dropped to 138.  Platelet count dropped to 88.  Magnesium sulfate was continued.  On postop day #2 her liver function  studies continued to improve with LDH 424 and SGOT 89 and platelets rose  to 93,000.  Diuresis continued.  She was moved from AICU to the mother-  baby unit.  Postop day #3 blood pressures were 120-128 over 77-86.  Liver function studies remained elevated.  It did not increase  significantly but did increase a little bit from the day before.  It was  decided to keep the patient in the hospital for the  time being.  On  postop day #4 her blood pressures were 122-130 over 78-86.  She  continued to diurese.  Platelets rose to 182.  SGOT fell to 127, SGPT to  132.  Amylase was 374, which was up from November 11.  On postop day #5  blood pressure was 119/83.  She was afebrile.  Platelets were up to 224.  AST was down to 98 and ALT was down to 120.  Her fundus was firm.  Lochia was within normal limits.  Her incision was clean, dry, and  intact.  Extremities were within normal limits with no edema, and she  was felt to have received full benefit of her hospital stay and was  discharged to home.   DISCHARGE LABORATORY DATA:  White blood count 11.0, hemoglobin 10.7,  platelets 224.  Sodium 135, potassium 4.1, creatinine 0.6, bilirubin  0.6, AST 98, ALT 120, and her free T4 was pending.  Lipase was also  pending.   DISCHARGE MEDICATIONS:  1. Motrin 600 mg p.o. q.6h. p.r.n.  2. Tylox one to two p.o. q.4h. p.r.n.   DISCHARGE INSTRUCTIONS:  PIH  precautions were reviewed.  The patient  will call with any increase in headache, nausea, vomiting or fever.   Discharge follow-up will occur on February 10, 2006, at 10:30 a.m. at  [facility], and labs will be rechecked at time.      Marie L. Williams, C.N.M.      Hal Morales, M.D.  Electronically Signed    MLW/MEDQ  D:  02/06/2006  T:  02/06/2006  Job:  [id number]

## 2010-08-10 NOTE — Assessment & Plan Note (Signed)
[facility] OFFICE NOTE   Karen Fleming, Karen Fleming                      MRN:          [id number]  DATE:03/19/2006                            DOB:          January 30, 1971    This is a 40 year old woman here with her husband to establish with our  practice.  She is also for followup of HELLP syndrome and a recent  Cesarean section.  She sees Dr. Dierdre Forth for gynecological care.  She was admitted to [facility] on November 9 with symptoms of  preeclampsia, including systolic blood pressures greater than 200,  headaches, and edema.  Because of difficulty getting her blood pressure  down, it was decided to do a C-section on November 10, since she was at  [redacted] weeks gestation.  Prior to that, her pregnancy was very unremarkable.  The C-section went well.  The baby has done extremely well since then.  Shortly after delivery, her blood pressures came down.  The edema  improved and her headaches went away.  However, shortly after that, she  developed symptoms of HELLP syndrome, including increased liver enzymes,  increase pancreatic enzymes indicative of some mild pancreatitis, as  well as a low platelet count.  Abdominal ultrasound obtained in the  hospital was within normal limits.  She was watched closely and  supported with fluids, and her counts normalized rapidly over the next  few days.  She was eventually discharged on November 15 to go home.  She  was seen by Dr. Pennie Rushing in her office on November 20 and again on  December 17.  Each time, her blood pressure was doing well and she felt  fine.  Serial counts improved as well (see full laboratory report in her  chart).  Platelet cell count was as high as 19.3 on November 10.  It was  down to 11.0 by her discharge on November 15.  Hemoglobin dropped some  from 12.0 to 10.7 during this time, and her platelet count got as low as  88,000, but then was back to 224,000 by  the time of her discharge.  Similarly, liver enzymes showed a maximal AST of 452 on November 10 with  an ALT of 246.  Total bilirubin of 1.4.  Also amylase as high as 374 and  a lipase as high as 173 during her hospitalization.  She is breast-  feeding at home and apparently, this is going well.  She had felt fine,  except for some mild heartburn, until 2 days ago, when she developed a  similar abdominal pain to what she felt in the hospital.  This was  centered around the right upper quadrant and was particularly severe 2  nights ago.  At that time, it radiated around the right flank to the  right mid back region, then it improved.  Now, today, she still has some  mild pain in the right flank and right upper quadrant area of the  abdomen.  She has no fever.  NO nausea or vomiting what so ever.  Her  appetite remains good.  She denies  any urinary tract symptoms.  She is  taking only some Tums for the heartburn mentioned above.  She is  scheduled to followup with Dr. Pennie Rushing in March of 2008.  Other past  medical history, she is a G1, P1.  She had an appendectomy in 2001.  She  had an episode of mild pancreatitis around this time in 2001.  She had  another episode of pancreatitis in 2005.  She has a history of high  cholesterol.  She was diagnosed with hypothyroidism in 1990.   ALLERGIES:  None.   MEDICATIONS:  Multivitamin daily.  Also levothyroxine 50 mcg daily.   HABITS:  She does not use tobacco or alcohol.   SOCIAL HISTORY:  She is married with 1 child.  She is a Futures trader.   FAMILY HISTORY:  Remarkable for hypertension and diabetes.   IMMUNIZATIONS:  She had a tetanus booster in November 2006.  She also  had a pneumonia shot in November of 2006.   OBJECTIVE:  Height 5 feet 2 inches, weight 141, BP 114/68, pulse 64 and  regular, temperature 97.6 degrees.  GENERAL:  She is smiling and appears to be in no distress.  No signs of  jaundice.  BACK:  No CVA tenderness.  LUNGS:   Clear.  CARDIAC:  Rate and rhythm are regular without gallops, murmurs, or rubs.  Distal pulses are full.  EXTREMITIES:  Show no edema.  ABDOMEN:  Soft.  Normal bowel sounds.  No masses.  She is mildly to  moderately tender in the right upper quadrant and a bit less so in the  left upper quadrant and in the suprapubic region.  There is no rebound  or guarding.   Urinalysis today shows trace leukocytes only.   ASSESSMENT AND PLAN:  Abdominal pain status post a C-section and status  post HELLP syndrome.  With the heartburn it is possible this could be  duodenitis or something of this nature.  I have advised her to switch to  Mylanta to use symptomatically.  It could certainly be a postpartum re-  expression of the HELLP syndrome that she had experienced before as  well.  She is to continue with fluids and a normal diet.  Will check  laboratories today including a hepatic panel, a lipase, an amylase, and  a CBC.  She is scheduled to followup with Dr. Fabian Sharp, who will be her  established primary care Karen Fleming on December 28.  If her symptoms  change in the meantime, she should let us know.     Tera Mater. Clent Ridges, MD  Electronically Signed    SAF/MedQ  DD: 03/19/2006  DT: 03/19/2006  Job #: [id number]

## 2010-08-10 NOTE — H&P (Signed)
NAMEADALAE, Fleming             ACCOUNT NO.:  [id number]   MEDICAL RECORD NO.:  [id number]          PATIENT TYPE:  INP   LOCATION:  9157                          [facility]ACILITY:  [facility]   PHYSICIAN:  Hal Morales, M.D.DATE OF BIRTH:  03-03-1971   DATE OF ADMISSION:  01/31/2006  DATE OF DISCHARGE:                              HISTORY & PHYSICAL   [name] is a 40 year old gravida 1, para 0, who presents unannounced  MAU at 36 weeks 3 days with complaints of sudden onset of severe  bilateral frontal headache approximately one hour prior to admission.  The patient reports that she has not had any vision changes associated  with this headache, however, she has had scotomata in the past and was  referred to ophthalmology.  The patient initially denied right upper  quadrant or abdominal pain, however, after admission, the patient began  complaining of epigastric pain.  The patient reports that she is also  having some cramping, however, no definitive contractions.  The patient  denies leakage of fluid or bleeding.  The patient reports that her baby  is moving normally.  The patient has not previously had a history of  high blood pressure prior to admission, however, the patient's husband  reports that he did take her blood pressure at home and that her blood  pressure was 200 systolic, however, diastolic values were unknown.  Immediately prior to admission at the time of arrival to [facility], the patient's husband reports that the patient  became unresponsive and MAU staff reports that the patient was partially  unresponsive upon admission to maternity admissions, however, no  definite seizure activity was noted.  The patient denies any nausea and  vomiting.  The patient denies any fever.  The patient denies upper  respiratory infection symptoms.  The patient has been normotensive  throughout her pregnancy prior to admission.  The patient's pregnancy  has been  remarkable for:  1. Advanced maternal age.  2. Language barrier.  3. History of hypothyroidism.  4. History of panic and anxiety disorder.   No medications.   PRENATAL LABS:  Initial hemoglobin 12.9, hematocrit 37.1, platelets  271,000.  Blood type O positive.  Antibody screen negative.  RPR is  nonreactive.  Rubella titer immune.  Hepatitis B surface antigen  negative.  HIV declined.  Cystic fibrosis screen was negative.  TSH in  the first trimester was 0.206.  Pap smear atypical squamous cells of  undetermined significance with negative high risk HPV typing.  Gonorrhea  and Chlamydia cultures were negative.  First trimester screen normal  ultrasound, Down's syndrome risk 1 in 494 and trisomy-18 risk 1 in  greater than 8,000.  Adjusted Down's syndrome risk after ultrasound 1 in  988.  16 week AFT only was negative.  Hemoglobin at 26 weeks 8.9.  One  hour Glucola 124.   HISTORY OF PRESENT PREGNANCY:  The patient entered care at [redacted] weeks  gestation.  The patient's pregnancy has been followed by the MD service  at [facility].  The patient decided to undergo first  trimester screening  secondary to advanced maternal age and declined  amniocentesis.  Patient seen at 12 weeks with complaints of  extraparametal symptoms from Phenergan and was started on Zofran for  nausea and vomiting with good results.  Ultrasound was done at 19 weeks  which revealed normal anatomy and cervix 3.77 cm.  The patient  discontinued Prozac at approximately 21 weeks of gestation.  Patient  with a normal TSH at [redacted] weeks gestation.  The patient was seen at 23  weeks with complaints of a pleuritic rash.  The patient was instructed  to use hydrocortisone cream and referred to dermatology for further  evaluation.  The patient was treated by dermatology with steroid Dosepak  with resolution of rash and dermatology reported rash as a result of  poison ivy.  Patient with complaints of GERD at 26 weeks and  was started  on Protonix at that time.  The remainder of the patient's pregnancy has  been unremarkable.   OB HISTORY:  Pregnancy number one is current.   MEDICAL HISTORY:  1. History of hypothyroidism, patient on Levoxyl 50 mcg initially at      the onset of pregnancy, however, currently at 25 mcg.  2. Patient with history of anxiety and panic disorder on Prozac in the      past, discontinued at 21 weeks.  3. History of pancreatitis in 2005 as well as chronic constipation.   SURGICAL HISTORY:  Significant for appendectomy in October 2001.   HOSPITALIZATIONS:  Pancreatitis in 2005 and for appendectomy.   CURRENT MEDICATIONS:  Levoxyl 25 mcg daily, prenatal vitamins.   ALLERGIES:  No known drug allergies.   FAMILY HISTORY:  Father with coronary artery disease and chronic  hypertension.  Paternal grandfather with chronic hypertension.  Mother  with varicose veins.  Maternal grandmother with diabetes.   GENETIC HISTORY:  The patient's mother and father are first cousins.  The patient is 33 years old at the time of birth and father of the baby  37 years old at the time of birth.  However, genetic history is,  otherwise, negative.   SOCIAL HISTORY:  Patient with a Bachelor's Degree education and is a  Sport and exercise psychologist, however, the patient is currently a homemaker.  The  patient is married to the father of the baby, Karen Fleming, who is  involved and supportive.  Father of the baby with PhD education and is  an Camera operator.  The patient and her husband are Catholic.  The  patient denies the use of tobacco, alcohol, or street drugs.  The  patient's domestic violence screen is negative.   REVIEW OF SYSTEMS:  Positive for headaches as well as epigastric pain,  otherwise, review of systems is typical of one intrauterine pregnancy.   PHYSICAL EXAMINATION:  VITAL SIGNS:  The patient is afebrile.  Blood pressure 181/99, 179/87, 160/90, 175/82 upon admission.  Heart rate 63 to  67.  Respirations 16 to  18.  SKIN:  Warm and dry, color is satisfactory.  GENERAL:  Patient in obvious distress and holding her head.  The patient  is alert and oriented.  LUNGS:  Clear.  HEART:  Regular rate and rhythm.  BREASTS:  Soft.  HEENT:  Within normal limits.  ABDOMEN:  Soft with bowel sounds in all four quadrants.  Mild tenderness  is noted in the epigastric area to deep palpation, no rebound or  guarding is noted.  PELVIC:  Uterine fundus soft and nontender.  Fundal height consistent  with [redacted]  weeks gestation.  Fetal heart rate with baseline in the 120s  with variability and accelerations present.  Reactive fetal heart rate  tracing is noted.  No uterine contractions are noted on fetal heart  monitor.  Sterile vaginal exam reveals cervix to be closed, 50%, and -3,  in vertex presentation.  EXTREMITIES:  No edema is noted, deep tendon reflexes are 2+, no clonus.  Negative Homan's sign bilaterally.   ADMISSION LABS:  Cathed urinalysis, specific gravity 1.020, negative  protein, 0.2 mg per dl of urobilinogen.  CBC on admission with WBC 14.2,  hemoglobin 13.3, hematocrit 38.8, platelets 195,000.  Metabolic panel  within normal limits with the exception of SGOT slightly elevated at 40,  SGPT is normal at 23, LDH 161, uric acid 4.9.   ASSESSMENT:  1. Intrauterine pregnancy at 36 3/7 weeks.  2. Severe headache.  3. Elevated blood pressure.   PLAN:  Dr. Pennie Rushing notified and has evaluated the patient.  The patient  will be admitted for stabilization of blood pressure and treatment of  headache.  The patient will be evaluated for possible pre-eclampsia.      Rhona Leavens, CNM      Hal Morales, M.D.  Electronically Signed    NOS/MEDQ  D:  01/31/2006  T:  02/01/2006  Job:  [id number]

## 2010-12-15 ENCOUNTER — Other Ambulatory Visit: Payer: Self-pay | Admitting: Internal Medicine

## 2011-02-20 ENCOUNTER — Other Ambulatory Visit (INDEPENDENT_AMBULATORY_CARE_PROVIDER_SITE_OTHER): Payer: BC Managed Care – PPO

## 2011-02-20 DIAGNOSIS — Z0000 Encounter for general adult medical examination without abnormal findings: Secondary | ICD-10-CM

## 2011-02-20 LAB — CBC WITH DIFFERENTIAL/PLATELET
Eosinophils Absolute: 0.2 10*3/uL (ref 0.0–0.7)
Lymphocytes Relative: 23.8 % (ref 12.0–46.0)
MCHC: 33.7 g/dL (ref 30.0–36.0)
Monocytes Relative: 8.3 % (ref 3.0–12.0)
Platelets: 308 10*3/uL (ref 150.0–400.0)
RBC: 3.94 Mil/uL (ref 3.87–5.11)
WBC: 7.1 10*3/uL (ref 4.5–10.5)

## 2011-02-20 LAB — BASIC METABOLIC PANEL
CO2: 24 mEq/L (ref 19–32)
Calcium: 8.7 mg/dL (ref 8.4–10.5)
Creatinine, Ser: 0.7 mg/dL (ref 0.4–1.2)
Glucose, Bld: 97 mg/dL (ref 70–99)
Potassium: 3.9 mEq/L (ref 3.5–5.1)
Sodium: 139 mEq/L (ref 135–145)

## 2011-02-20 LAB — HEPATIC FUNCTION PANEL
ALT: 14 U/L (ref 0–35)
AST: 18 U/L (ref 0–37)
Albumin: 3.8 g/dL (ref 3.5–5.2)
Alkaline Phosphatase: 63 U/L (ref 39–117)

## 2011-02-20 LAB — LIPID PANEL
Cholesterol: 239 mg/dL — ABNORMAL HIGH (ref 0–200)
Total CHOL/HDL Ratio: 5

## 2011-02-20 LAB — TSH: TSH: 1.05 u[IU]/mL (ref 0.35–5.50)

## 2011-02-26 ENCOUNTER — Encounter: Payer: Self-pay | Admitting: Internal Medicine

## 2011-02-27 ENCOUNTER — Encounter: Payer: Self-pay | Admitting: Internal Medicine

## 2011-02-27 ENCOUNTER — Ambulatory Visit (INDEPENDENT_AMBULATORY_CARE_PROVIDER_SITE_OTHER): Payer: BC Managed Care – PPO | Admitting: Internal Medicine

## 2011-02-27 VITALS — BP 120/80 | HR 66 | Ht 62.0 in | Wt 145.0 lb

## 2011-02-27 DIAGNOSIS — M79609 Pain in unspecified limb: Secondary | ICD-10-CM

## 2011-02-27 DIAGNOSIS — M79621 Pain in right upper arm: Secondary | ICD-10-CM

## 2011-02-27 DIAGNOSIS — Z23 Encounter for immunization: Secondary | ICD-10-CM

## 2011-02-27 DIAGNOSIS — E039 Hypothyroidism, unspecified: Secondary | ICD-10-CM

## 2011-02-27 DIAGNOSIS — Z0000 Encounter for general adult medical examination without abnormal findings: Secondary | ICD-10-CM

## 2011-02-27 DIAGNOSIS — E785 Hyperlipidemia, unspecified: Secondary | ICD-10-CM

## 2011-02-27 MED ORDER — ATORVASTATIN CALCIUM 10 MG PO TABS: 10.0000 mg | ORAL_TABLET | Freq: Every day | ORAL | Status: DC

## 2011-02-27 NOTE — Patient Instructions (Signed)
Begin generic lipitor  Check labs in 2 months  And then plan follow up Call if concern about side effects. If arm pain to  improving in the next month or so contact us for advice and follow up.

## 2011-02-27 NOTE — Progress Notes (Signed)
Subjective:    Patient ID: Karen Fleming, female    DOB: [birth year], 40 y.o.   MRN: [id number]  HPI Patient comes in today for preventive visit and follow-up of medical issues. Update of her history since her last visit. THyroid:   No thyroid med when went to [place] for some months and gained 10 pounds or more when came back began every day med and only a banana for lunch and is doing better but still having extra weight. Diet and exercise except in winter. Eating right .   Got flu shot 6 weeks ago and got pain in arm after that like a painful twinge that continues and hurts to hug child and lift  And rotate  No shoulder pain and no injury noted o redness with shot but no other associations. No other rx   Review of Systems ROS:  GEN/ HEENTNo fever, sweats headaches vision problems hearing changes, CV/ PULM; No chest pain shortness of breath cough, syncope,edema  change in exercise tolerance. GI /GU: No adominal pain, vomiting, change in bowel habits. No blood in the stool. No significant GU symptoms. SKIN/HEME: ,no acute skin rashes suspicious lesions or bleeding. No lymphadenopathy, nodules, masses.  NEURO/ PSYCH:  No neurologic signs such as weakness numbness No depression anxiety. IMM/ Allergy: No unusual infections.  Allergy .   To gen gyne check in J[place]. REST of 12 system review negative  Past Medical History  Diagnosis Date  . Hypothyroidism   . HELLP (hemolytic anemia/elev liver enzymes/low platelets in pregnancy)     c-section 11/10  . Panic attacks     on meds pre pregnancy/hx  . Pancreatitis     2000 and 2005    History   Social History  . Marital Status: Married    Spouse Name: N/A    Number of Children: N/A  . Years of Education: N/A   Occupational History  . Not on file.   Social History Main Topics  . Smoking status: Never Smoker   . Smokeless tobacco: Not on file  . Alcohol Use: No  . Drug Use: No  . Sexually Active: Not on file   Other Topics  Concern  . Not on file   Social History Narrative   HH of 3 pet Young child at [employer] petsNo etsNo caffeineDrink milk 2-3 x per dayNo reg exercise [place]    Past Surgical History  Procedure Date  . Appendectomy 2001    Family History  Problem Relation Age of Onset  . Heart attack Father     in 21s   . Diabetes      gm and gf  . Hypertension      gm and gf  . Arthritis      gp ? OA  . Hyperlipidemia Mother   . Hyperlipidemia Father     No Known Allergies  Current Outpatient Prescriptions on File Prior to Visit  Medication Sig Dispense Refill  . levothyroxine (SYNTHROID, LEVOTHROID) 50 MCG tablet TAKE 1 TABLET EVERY DAY  30 tablet  5    BP 120/80  Pulse 66  Ht 5\' 2"  (1.575 m)  Wt 145 lb (65.772 kg)  BMI 26.52 kg/m2  LMP 02/13/2011       Objective:   Physical Exam Physical Exam: Vital signs reviewed BJY:NWGN is a well-developed well-nourished alert cooperative  white female who appears her stated age in no acute distress.  HEENT: normocephalic  traumatic , Eyes: PERRL EOM's full, conjunctiva clear, glasses Nares:  paten,t no deformity discharge or tenderness., Ears: no deformity EAC's clear TMs with normal landmarks. Mouth: clear OP, no lesions, edema.  Moist mucous membranes. Dentition in adequate repair. NECK: supple without masses, thyromegaly or bruits.no nodules  CHEST/PULM:  Clear to auscultation and percussion breath sounds equal no wheeze , rales or rhonchi. No chest wall deformities or tenderness. CV: PMI is nondisplaced, S1 S2 no gallops, murmurs, rubs. Peripheral pulses are full without delay.No JVD .  Breast: normal by inspection . No dimpling, discharge, masses, tenderness or discharge . LN: no cervical axillary inguinal adenopathy  ABDOMEN: Bowel sounds normal nontender  No guard or rebound, no hepato splenomegal no CVA tenderness.  No hernia. Extremtities:  No clubbing cyanosis or edema, no acute joint swelling or redness no focal atrophy  .  Point area right infradeltoid biceps area  But no palpable lump and nl rom  No crepitus NEURO:  Oriented x3, cranial nerves 3-12 appear to be intact, no obvious focal weakness,gait within normal limits no abnormal reflexes or asymmetrical SKIN: No acute rashes normal turgor, color, no bruising or petechiae. PSYCH: Oriented, good eye contact, no obvious depression anxiety, cognition and judgment appear normal.  Lab Results  Component Value Date   WBC 7.1 02/20/2011   HGB 12.3 02/20/2011   HCT 36.6 02/20/2011   PLT 308.0 02/20/2011   GLUCOSE 97 02/20/2011   CHOL 239* 02/20/2011   TRIG 84.0 02/20/2011   HDL 45.30 02/20/2011   LDLDIRECT 173.7 02/20/2011   LDLCALC 134* 12/11/2007   ALT 14 02/20/2011   AST 18 02/20/2011   NA 139 02/20/2011   K 3.9 02/20/2011   CL 106 02/20/2011   CREATININE 0.7 02/20/2011   BUN 19 02/20/2011   CO2 24 02/20/2011   TSH 1.05 02/20/2011          Assessment & Plan:  Preventive Health Care Continue lifestyle intervention healthy eating and exercise . Reviewed  Pretty healthy ls  Weight gain  Off thyroid  And in [place] getting bettter  Hyperlipidemia  Deteriorated with weight gain but improved .   Wants to take statin med  cause of family hx   Risk benefit of medication discussed.  Not usually  high risk but since getting worse over time with healthy ls  Want to try this  Disc very  Important  Not getting pregnancy  with this med  Pt aware and is not concerned .    Then rx lipitor 10 and check labs in 2 months with tsh  Hypothyroid  Stay on same dose daily med and follow   Right arm pain  Nl exam but area of shot over biceps doesn't seem like shoulder   Will observe and fu as dsc

## 2011-04-03 ENCOUNTER — Telehealth: Payer: Self-pay | Admitting: *Deleted

## 2011-04-03 MED ORDER — ATORVASTATIN CALCIUM 10 MG PO TABS: 10.0000 mg | ORAL_TABLET | Freq: Every day | ORAL | Status: DC

## 2011-04-03 MED ORDER — LEVOTHYROXINE SODIUM 50 MCG PO TABS: 50.0000 ug | ORAL_TABLET | Freq: Every day | ORAL | Status: DC

## 2011-04-03 NOTE — Telephone Encounter (Signed)
Refill on levothyroxine and lipitor I have contacted pt because we have never sent anything to this pharmacy.  Spoke with husband and he said that this is okay to send  rx sent to pharmacy.

## 2011-04-23 ENCOUNTER — Other Ambulatory Visit: Payer: Self-pay | Admitting: Internal Medicine

## 2011-04-23 DIAGNOSIS — Z1231 Encounter for screening mammogram for malignant neoplasm of breast: Secondary | ICD-10-CM

## 2011-05-02 ENCOUNTER — Other Ambulatory Visit (INDEPENDENT_AMBULATORY_CARE_PROVIDER_SITE_OTHER): Payer: BC Managed Care – PPO

## 2011-05-02 DIAGNOSIS — E039 Hypothyroidism, unspecified: Secondary | ICD-10-CM

## 2011-05-02 DIAGNOSIS — E785 Hyperlipidemia, unspecified: Secondary | ICD-10-CM

## 2011-05-02 LAB — LIPID PANEL
HDL: 46.8 mg/dL (ref >39.00)
Triglycerides: 71 mg/dL (ref 0.0–149.0)

## 2011-05-02 LAB — TSH: TSH: 0.63 u[IU]/mL (ref 0.35–5.50)

## 2011-05-03 ENCOUNTER — Encounter: Payer: Self-pay | Admitting: *Deleted

## 2011-05-03 NOTE — Progress Notes (Signed)
Quick Note:    Letter sent to pt.  ______

## 2011-05-23 ENCOUNTER — Ambulatory Visit (AMBULATORY_SURGERY_CENTER): Payer: BC Managed Care – PPO | Admitting: *Deleted

## 2011-05-23 ENCOUNTER — Telehealth: Payer: Self-pay | Admitting: *Deleted

## 2011-05-23 VITALS — Ht 64.0 in | Wt 138.3 lb

## 2011-05-23 DIAGNOSIS — Z1211 Encounter for screening for malignant neoplasm of colon: Secondary | ICD-10-CM

## 2011-05-23 NOTE — Progress Notes (Signed)
Pt here for PV for screening colon.  Pt had colonoscopy 11 years ago and says she had polyps. No family history of colon cancer.  Pt is not having any GI problems. Today she cannot remember where she had procedure.  She will check at home and call us back is she can find information from last colonoscopy.  Release of information form signed.  Pt thinks she should have another colonoscopy.  I explained to her that she may not be due for colonoscopy (pt is 41 years old)  and we could schedule OV with Dr. Leone Payor to discuss need for procedure.  She would like to see Dr. Leone Payor in office.  New pt appt scheduled for 3/22 at 10:00.  Colonoscopy scheduled for 3/14 cancelled. Ezra Sites

## 2011-05-23 NOTE — Telephone Encounter (Signed)
Pt here for PV for screening colon.  Pt had colonoscopy 11 years ago and says she had polyps. No family history of colon cancer.  Pt is not having any GI problems. Today she cannot remember where she had procedure.  She will check at home and call us back is she can find information from last colonoscopy.  Release of information form signed.  Pt thinks she should have another colonoscopy.  I explained to her that she may not be due for colonoscopy (pt is 41 years old)  and we could schedule OV with Dr. Leone Payor to discuss need for procedure.  She would like to see Dr. Leone Payor in office.  New pt appt scheduled for 3/22 at 10:00.  Colonoscopy scheduled for 3/14 cancelled.  Release of information form given to [facility].

## 2011-05-24 NOTE — Telephone Encounter (Signed)
Records release signed will await phone call from patient , pt has OV 3/22

## 2011-05-24 NOTE — Telephone Encounter (Signed)
Release put on Karen Fleming's desk. Dr. Marvell Fuller medical assistant.

## 2011-05-30 ENCOUNTER — Ambulatory Visit (HOSPITAL_COMMUNITY)
Admission: RE | Admit: 2011-05-30 | Discharge: 2011-05-30 | Disposition: A | Discharge status: Home / Self Care | Payer: BC Managed Care – PPO | Source: Ambulatory Visit | Attending: Internal Medicine | Admitting: Internal Medicine

## 2011-05-30 ENCOUNTER — Telehealth: Payer: Self-pay | Admitting: Internal Medicine

## 2011-05-30 DIAGNOSIS — Z1231 Encounter for screening mammogram for malignant neoplasm of breast: Secondary | ICD-10-CM | POA: Insufficient documentation

## 2011-05-30 NOTE — Telephone Encounter (Signed)
Patient has found the records from her previous colon.  Her husband will fax me the records for Dr Leone Payor to review and advise if she needs colonoscopy or does she need to keep the appt that is scheduled for 06/14/11.  I will await the records

## 2011-06-06 ENCOUNTER — Other Ambulatory Visit: Payer: BC Managed Care – PPO | Admitting: Internal Medicine

## 2011-06-06 NOTE — Telephone Encounter (Signed)
I spoke with the patient's husband.  Dr Leone Payor has reviewed what records we have and needs path from 2006.  I have canceled the appt with the husband.  He will contact the group that did her colon and try and get the path. He will call back when he is ready to schedule.

## 2011-06-14 ENCOUNTER — Ambulatory Visit: Payer: BC Managed Care – PPO | Admitting: Internal Medicine

## 2011-11-01 IMAGING — CR DG LUMBAR SPINE COMPLETE 4+V
5 series · 5 of 5 positions shown · non-contrast
Comparison: None.

CLINICAL DATA: Right-sided low back pain

LUMBAR SPINE - COMPLETE 4+ VIEW

[view not recorded (1 of 5)]
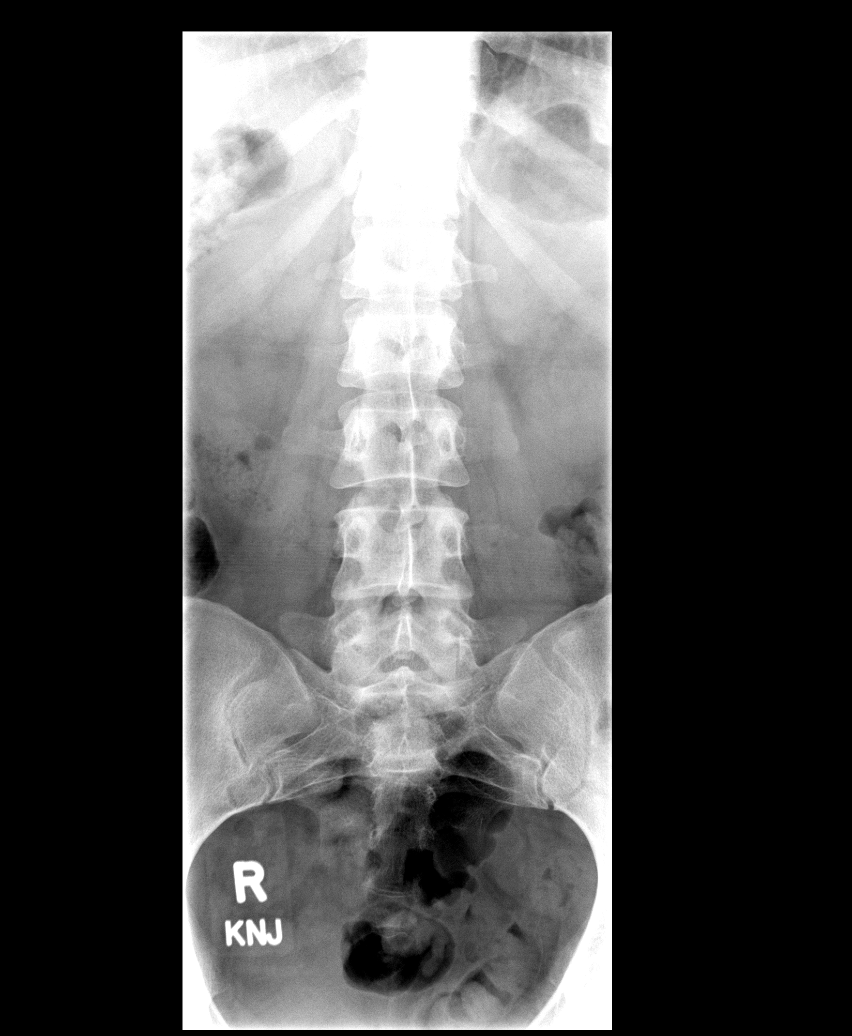

[view not recorded (2 of 5)]
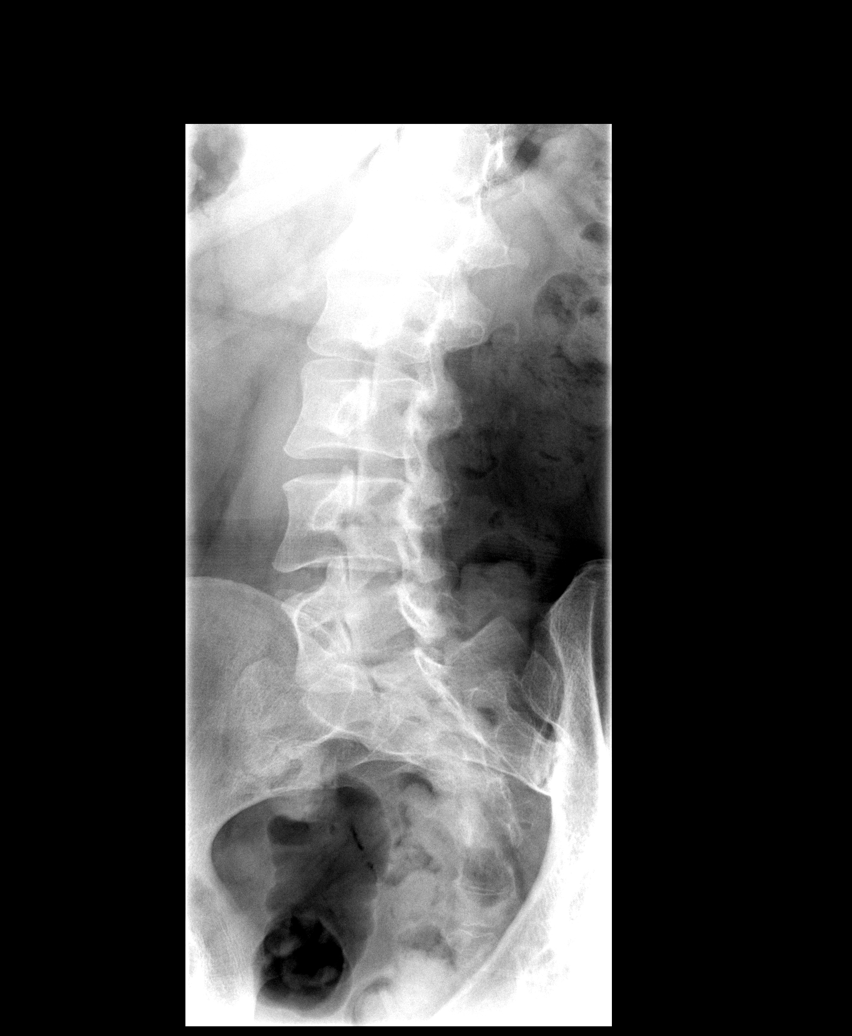

[view not recorded (3 of 5)]
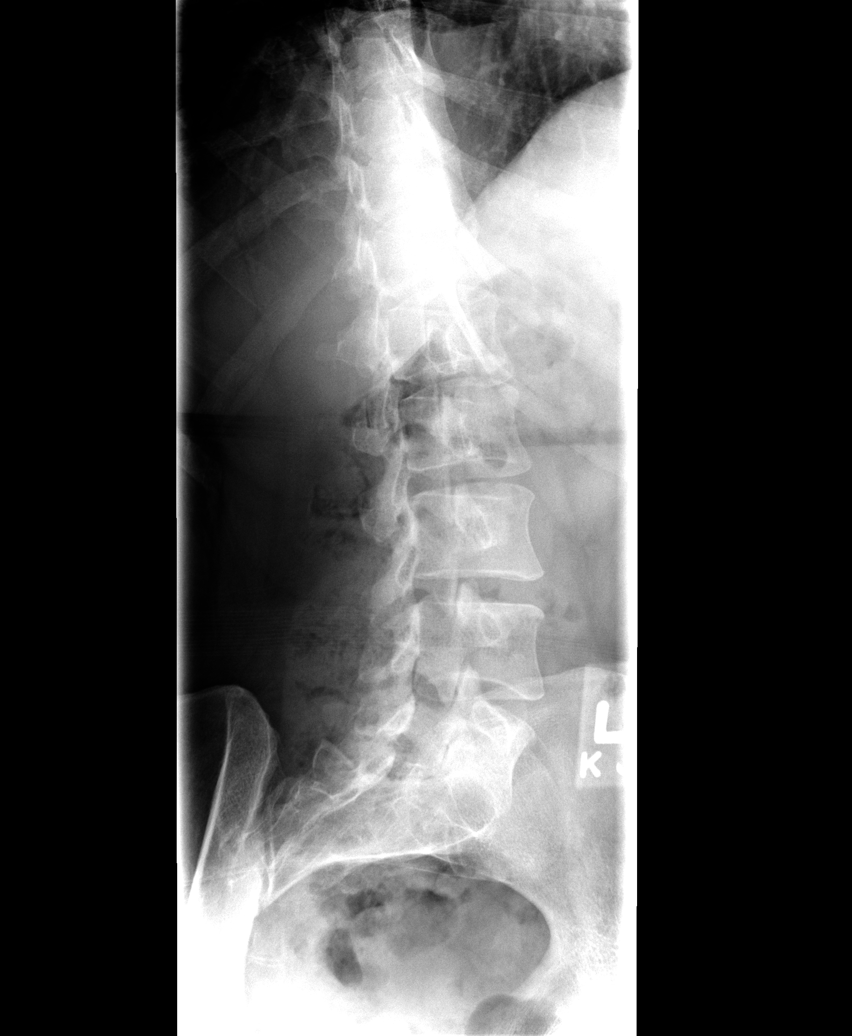

[view not recorded (4 of 5)]
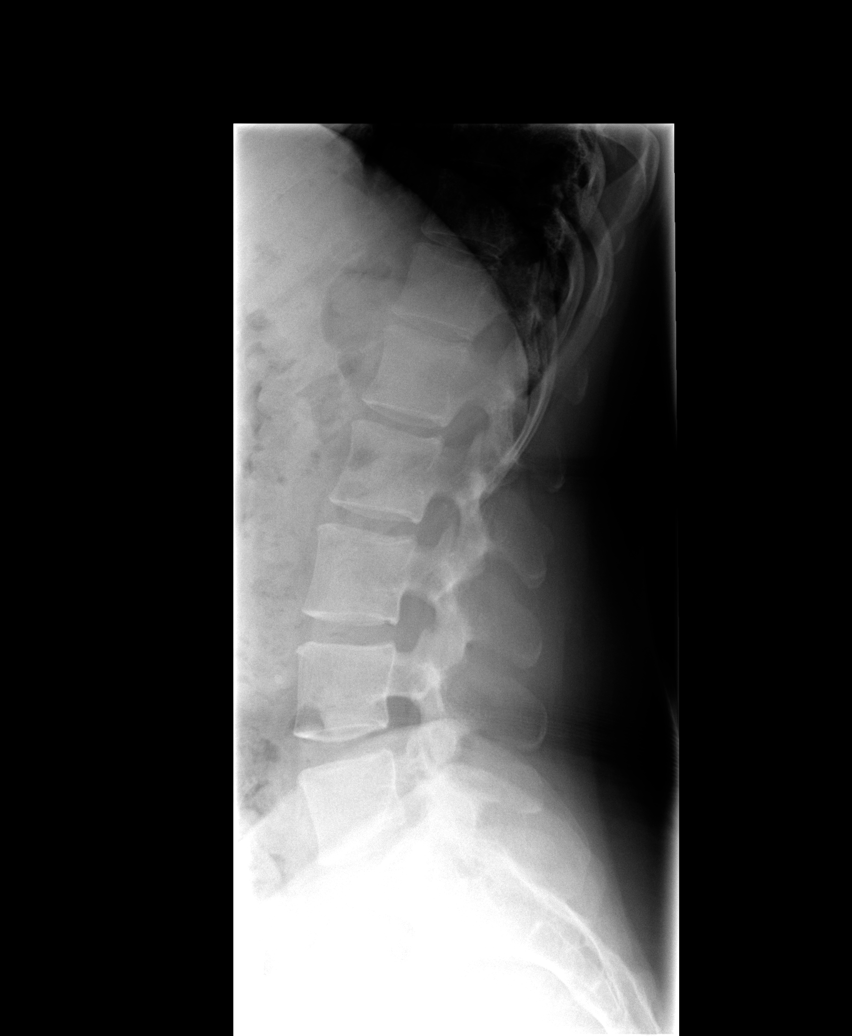

[view not recorded (5 of 5)]
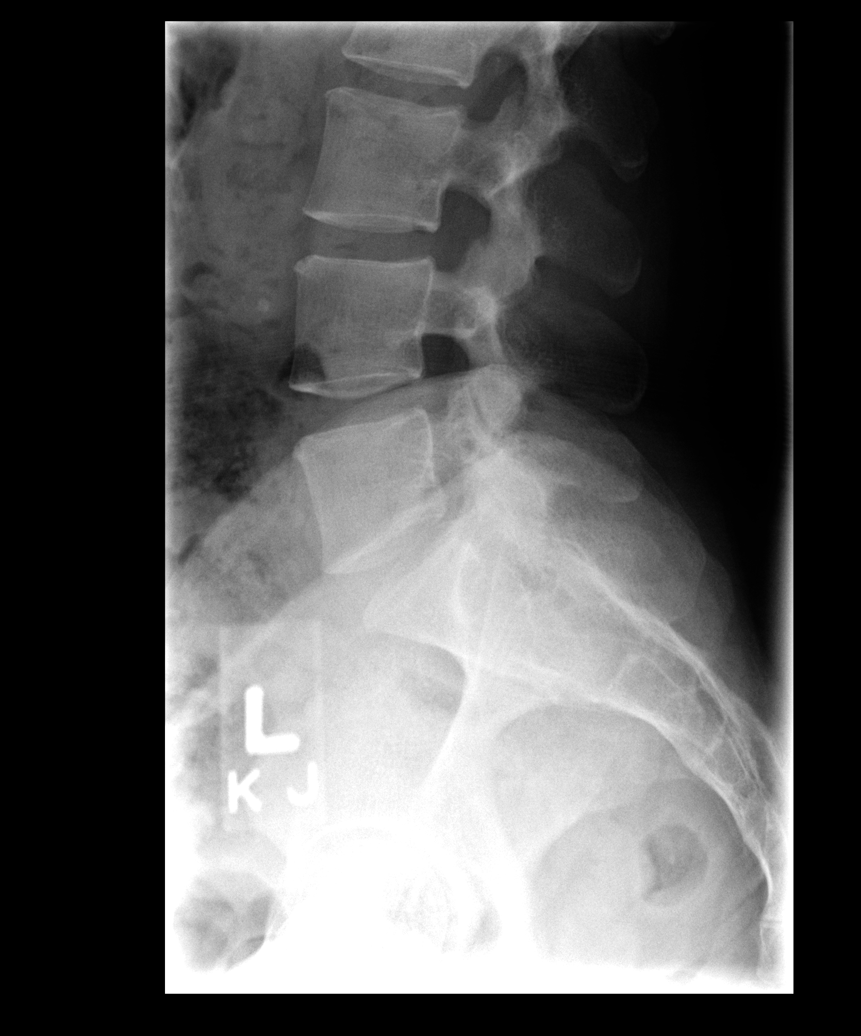

[5 of 5 positions shown; findings below may reference images not displayed]

FINDINGS: The lumbar vertebrae are in normal alignment with normal
intervertebral disc spaces.  No compression deformity is seen.  The
SI joints appear normal.
IMPRESSION: Normal alignment with normal disc spaces.

## 2011-11-01 IMAGING — CR DG HAND COMPLETE 3+V*L*
3 series · 3 of 3 positions shown · non-contrast
Comparison: None.

CLINICAL DATA: Bilateral hand pain, no acute trauma

LEFT HAND - COMPLETE 3+ VIEW

[view not recorded (1 of 3)]
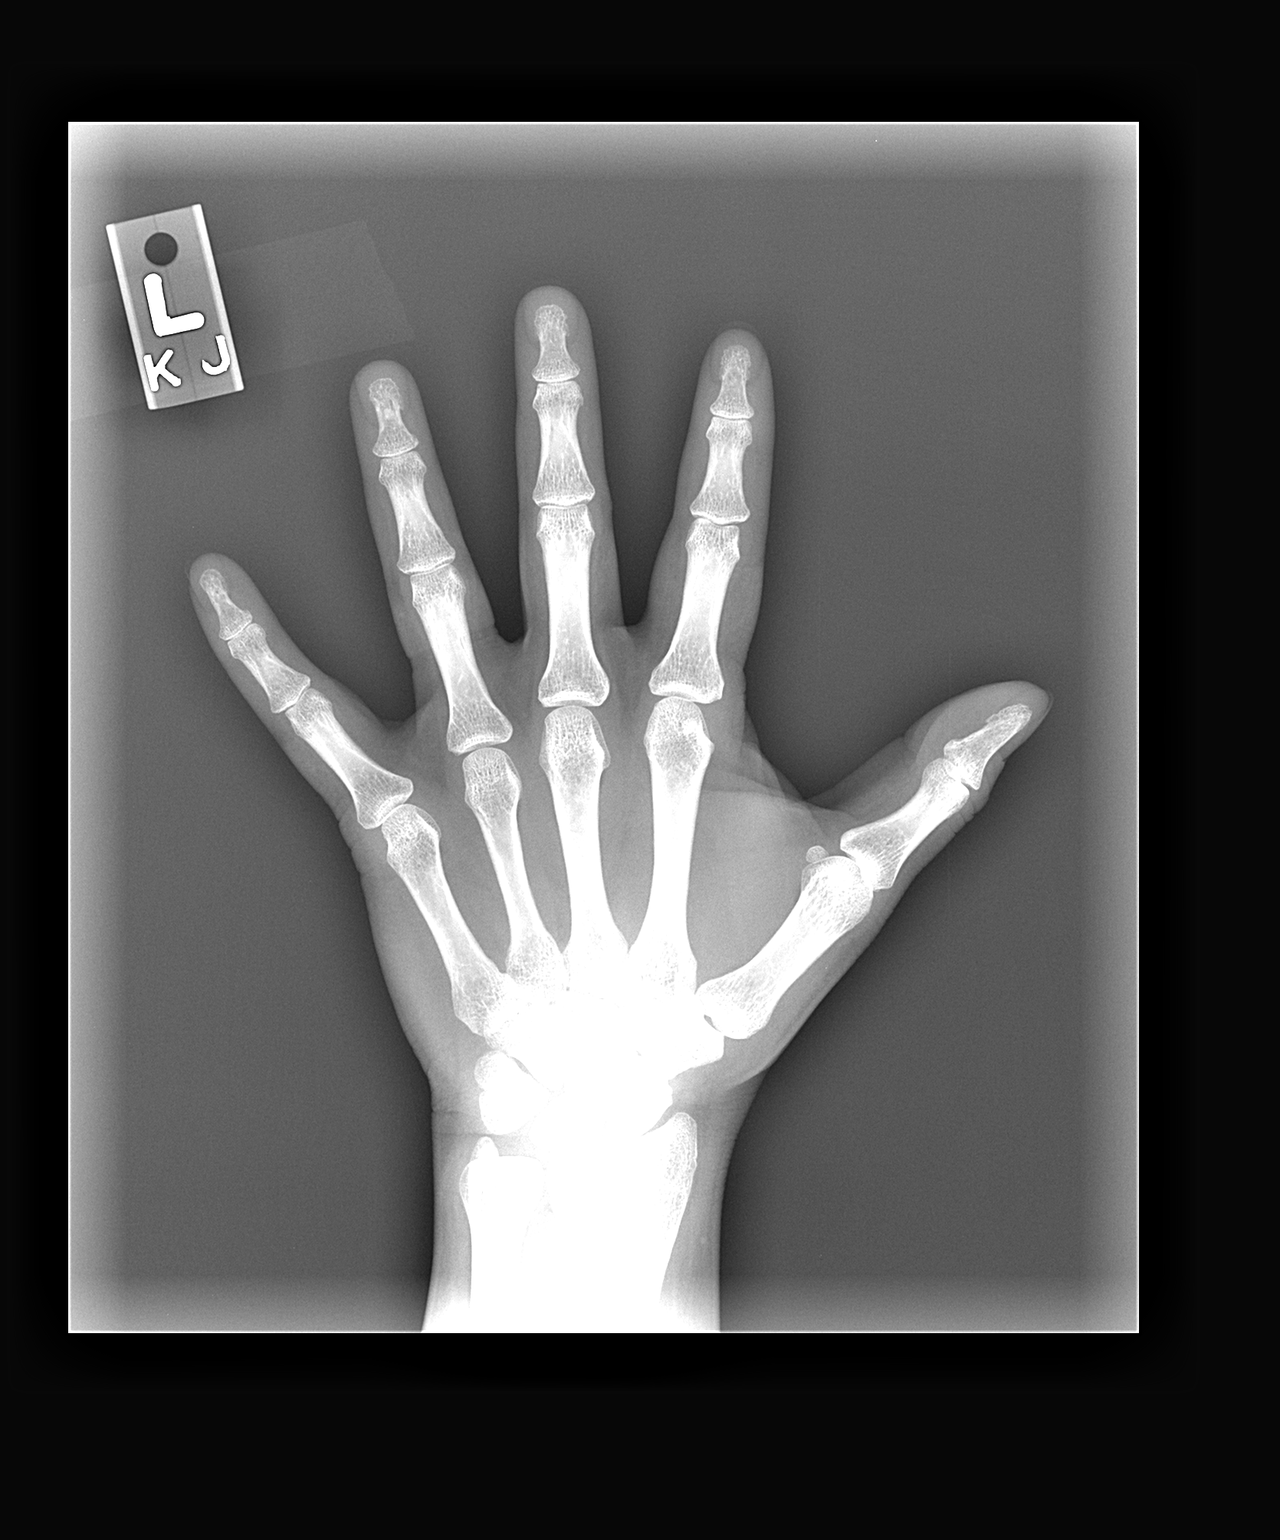

[view not recorded (2 of 3)]
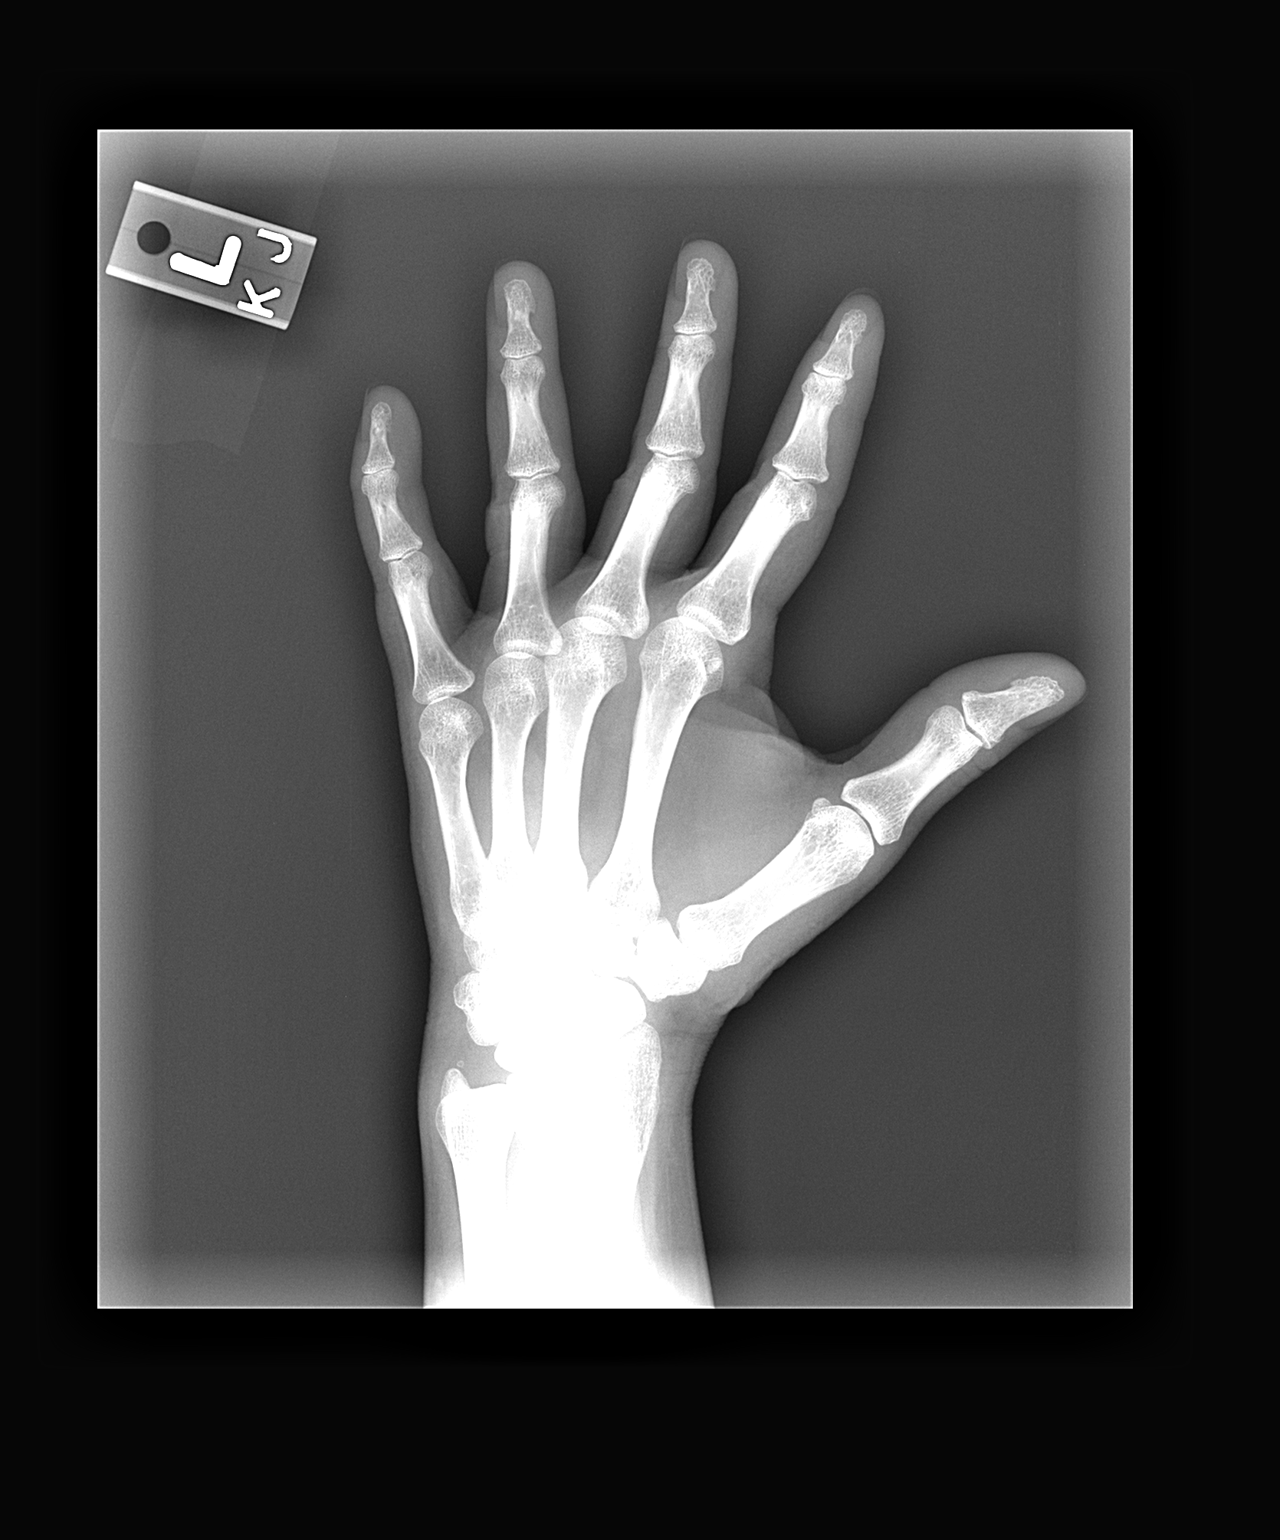

[view not recorded (3 of 3)]
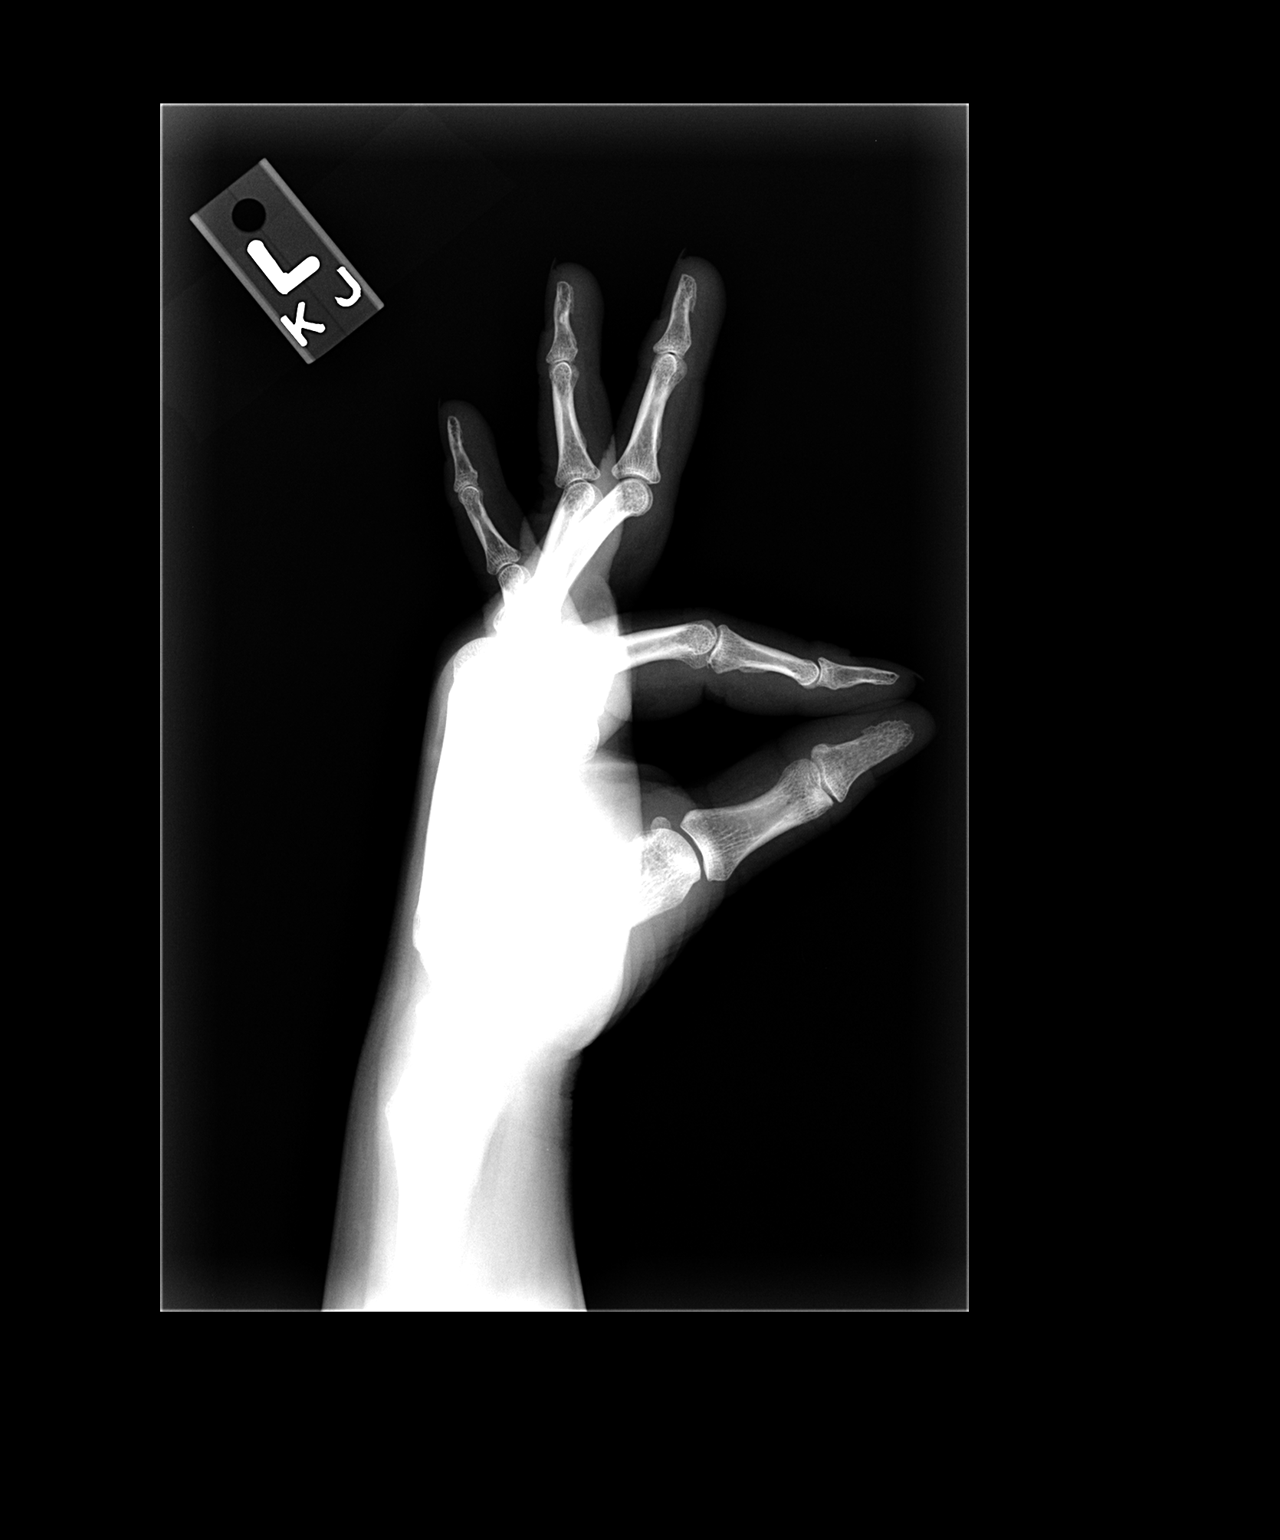

[3 of 3 positions shown; findings below may reference images not displayed]

FINDINGS: The radiocarpal joint space appears normal.  The carpal
bones are in normal position and the ulnar styloid is intact.  MCP,
PIP, and DIP joints appear normal.  No erosion is seen.
IMPRESSION: Negative left hand.

## 2012-02-06 ENCOUNTER — Other Ambulatory Visit: Payer: Self-pay | Admitting: Family Medicine

## 2012-02-06 MED ORDER — ATORVASTATIN CALCIUM 10 MG PO TABS: 10.0000 mg | ORAL_TABLET | Freq: Every day | ORAL | Status: DC

## 2012-02-10 ENCOUNTER — Other Ambulatory Visit: Payer: Self-pay | Admitting: Internal Medicine

## 2012-02-10 MED ORDER — LEVOTHYROXINE SODIUM 50 MCG PO TABS: 50.0000 ug | ORAL_TABLET | Freq: Every day | ORAL | Status: DC

## 2012-03-17 ENCOUNTER — Encounter: Payer: Self-pay | Admitting: Internal Medicine

## 2012-03-17 ENCOUNTER — Ambulatory Visit (INDEPENDENT_AMBULATORY_CARE_PROVIDER_SITE_OTHER): Payer: BC Managed Care – PPO | Admitting: Internal Medicine

## 2012-03-17 VITALS — BP 110/70 | HR 68 | Temp 98.6°F | Wt 118.0 lb

## 2012-03-17 DIAGNOSIS — Z8269 Family history of other diseases of the musculoskeletal system and connective tissue: Secondary | ICD-10-CM

## 2012-03-17 DIAGNOSIS — Z8489 Family history of other specified conditions: Secondary | ICD-10-CM

## 2012-03-17 DIAGNOSIS — M25561 Pain in right knee: Secondary | ICD-10-CM

## 2012-03-17 DIAGNOSIS — M25569 Pain in unspecified knee: Secondary | ICD-10-CM

## 2012-03-17 NOTE — Progress Notes (Signed)
Chief Complaint  Patient presents with  . Knee Pain    right    HPI: Patient comes in today  for  new problem evaluation. 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 No giving out or crepitus no previous injury or swelling. It bothers her at night when her niece come together otherwise no arthritis symptoms.  Also has some concerns as sister was recently diagnosed with scleroderma skin changes and only in [place] and her doctor told her that her sibling should be tested with blood work for scleroderma. Her sister had recurrent Miscarriage and skin changes and was evaluated.  Updated family history father had a stroke possibly from an arrhythmia sounds like atrial fib age 46 he had hypertension. ROS: See pertinent positives and negatives per HPI. She has stopped exercising recently he feels pretty well on her thyroid medication.  Past Medical History  Diagnosis Date  . Hypothyroidism   . Panic attacks     on meds pre pregnancy/hx  . Pancreatitis     2000 and 2005  . HELLP (hemolytic anemia/elev liver enzymes/low platelets in pregnancy)     c-section 2007  . Hyperlipidemia     Family History  Problem Relation Age of Onset  . Heart attack Father     in 75s   . Hyperlipidemia Father   . Diabetes      gm and gf  . Hypertension      gm and gf  . Arthritis      gp ? OA  . Hyperlipidemia Mother   . Colon cancer Neg Hx   . Stomach cancer Neg Hx     History   Social History  . Marital Status: Married    Spouse Name: N/A    Number of Children: N/A  . Years of Education: N/A   Social History Main Topics  . Smoking status: Never Smoker   . Smokeless tobacco: Never Used  . Alcohol Use: No  . Drug Use: No  . Sexually Active: None   Other Topics Concern  . None   Social History Narrative   HH of 3 pet  dogYoung  child at [employer] etsNo caffeineDrink milk 2-3 x per day reg exercise [place]    Outpatient Encounter Prescriptions as of 03/17/2012  Medication Sig Dispense Refill  . atorvastatin (LIPITOR) 10 MG tablet Take 1 tablet (10 mg total) by mouth daily.  90 tablet  0  . levothyroxine (SYNTHROID, LEVOTHROID) 50 MCG tablet Take 1 tablet (50 mcg total) by mouth daily.  90 tablet  0    EXAM:  BP 110/70  Pulse 68  Temp 98.6 F (37 C) (Oral)  Wt 118 lb (53.524 kg)  There is no height on file to calculate BMI.  GENERAL: vitals reviewed and listed above, alert, oriented, appears well hydrated and in no acute distress  \ MS: moves all extremities without noticeable focal  Abnormality right knee shows tenderness in the medial patellar area and perhaps in the joint line no crepitus good range of motion seems to be stable gait within normal limits. Hands show no acute arthritis or swelling synovitis or redness. Skin no acute changes.  PSYCH: pleasant and cooperative, no obvious depression or anxiety  ASSESSMENT AND PLAN:  Discussed the following assessment and plan:  1. Right knee pain  Medial patellar and joint line persistent progressive  2. Family history of scleroderma    Patient advised To  haave bbloood teest  regarding this but apparently hass nno symptoms of autoimmune disease  at this time.   advised not to the blood work but to get an opinion from rheumatology if her sister's doctors is that certain about this. Discussed the blood work didn't show false positives and false negatives and sometimes isn't that helpful except under expert opinion. At this time I don't see any signs of autoimmune disease. I believe that her knee pain is mechanical anterior patellar possibly related cannot rule out medial chronic or other.strain -Patient advised to return or notify health care team  immediately if symptoms worsen or persist or new concerns arise.  Patient Instructions  Plan refer  to dr fields and his sports medicine clinic.  About your knee x ray and certain exercises may e advised.   Usually blood tests for rheumatic disease is not advised if not having typical symptoms  Because there can be false positive and negative tests and are not helpful for health.   We will get  Rheumatology  Consult because of the advise fot you to get tests with blood tests.    Knee Pain The knee is the complex joint between your thigh and your lower leg. It is made up of bones, tendons, ligaments, and cartilage. The bones that make up the knee are:  The femur in the thigh.  The tibia and fibula in the lower leg.  The patella or kneecap riding in the groove on the lower femur. CAUSES  Knee pain is a common complaint with many causes. A few of these causes are:  Injury, such as:  A ruptured ligament or tendon injury.  Torn cartilage.  Medical conditions, such as:  Gout  Arthritis  Infections  Overuse, over training or overdoing a physical activity. Knee pain can be minor or severe. Knee pain can accompany debilitating injury. Minor knee problems often respond well to self-care measures or get well on their own. More serious injuries may need medical intervention or even surgery. SYMPTOMS The knee is complex. Symptoms of knee problems can vary widely. Some of the problems are:  Pain with movement and weight bearing.  Swelling and tenderness.  Buckling of the knee.  Inability to straighten or extend your knee.  Your knee locks and you cannot straighten it.  Warmth and redness with pain and fever.  Deformity or dislocation of the kneecap. DIAGNOSIS  Determining what is wrong may be very straight forward such as when there is an injury. It can also be challenging because of the complexity of the knee. Tests to make a diagnosis may include:  Your caregiver taking a history and doing a physical exam.  Routine X-rays can be used to rule out other problems. X-rays will  not reveal a cartilage tear. Some injuries of the knee can be diagnosed by:  Arthroscopy a surgical technique by which a small video camera is inserted through tiny incisions on the sides of the knee. This procedure is used to examine and repair internal knee joint problems. Tiny instruments can be used during arthroscopy to repair the torn knee cartilage (meniscus).  Arthrography is a radiology technique. A contrast liquid is directly injected into the knee joint. Internal structures of the knee joint then become visible on X-ray film.  An MRI scan is a non x-ray radiology procedure in which magnetic fields and a computer produce  two- or three-dimensional images of the inside of the knee. Cartilage tears are often visible using an MRI scanner. MRI scans have largely replaced arthrography in diagnosing cartilage tears of the knee.  Blood work.  Examination of the fluid that helps to lubricate the knee joint (synovial fluid). This is done by taking a sample out using a needle and a syringe. TREATMENT The treatment of knee problems depends on the cause. Some of these treatments are:  Depending on the injury, proper casting, splinting, surgery or physical therapy care will be needed.  Give yourself adequate recovery time. Do not overuse your joints. If you begin to get sore during workout routines, back off. Slow down or do fewer repetitions.  For repetitive activities such as cycling or running, maintain your strength and nutrition.  Alternate muscle groups. For example if you are a weight lifter, work the upper body on one day and the lower body the next.  Either tight or weak muscles do not give the proper support for your knee. Tight or weak muscles do not absorb the stress placed on the knee joint. Keep the muscles surrounding the knee strong.  Take care of mechanical problems.  If you have flat feet, orthotics or special shoes may help. See your caregiver if you need help.  Arch  supports, sometimes with wedges on the inner or outer aspect of the heel, can help. These can shift pressure away from the side of the knee most bothered by osteoarthritis.  A brace called an "unloader" brace also may be used to help ease the pressure on the most arthritic side of the knee.  If your caregiver has prescribed crutches, braces, wraps or ice, use as directed. The acronym for this is PRICE. This means protection, rest, ice, compression and elevation.  Nonsteroidal anti-inflammatory drugs (NSAID's), can help relieve pain. But if taken immediately after an injury, they may actually increase swelling. Take NSAID's with food in your stomach. Stop them if you develop stomach problems. Do not take these if you have a history of ulcers, stomach pain or bleeding from the bowel. Do not take without your caregiver's approval if you have problems with fluid retention, heart failure, or kidney problems.  For ongoing knee problems, physical therapy may be helpful.  Glucosamine and chondroitin are over-the-counter dietary supplements. Both may help relieve the pain of osteoarthritis in the knee. These medicines are different from the usual anti-inflammatory drugs. Glucosamine may decrease the rate of cartilage destruction.  Injections of a corticosteroid drug into your knee joint may help reduce the symptoms of an arthritis flare-up. They may provide pain relief that lasts a few months. You may have to wait a few months between injections. The injections do have a small increased risk of infection, water retention and elevated blood sugar levels.  Hyaluronic acid injected into damaged joints may ease pain and provide lubrication. These injections may work by reducing inflammation. A series of shots may give relief for as long as 6 months.  Topical painkillers. Applying certain ointments to your skin may help relieve the pain and stiffness of osteoarthritis. Ask your pharmacist for suggestions. Many  over the-counter products are approved for temporary relief of arthritis pain.  In some countries, doctors often prescribe topical NSAID's for relief of chronic conditions such as arthritis and tendinitis. A review of treatment with NSAID creams found that they worked as well as oral medications but without the serious side effects. PREVENTION  Maintain a healthy weight. Extra pounds put  more strain on your joints.  Get strong, stay limber. Weak muscles are a common cause of knee injuries. Stretching is important. Include flexibility exercises in your workouts.  Be smart about exercise. If you have osteoarthritis, chronic knee pain or recurring injuries, you may need to change the way you exercise. This does not mean you have to stop being active. If your knees ache after jogging or playing basketball, consider switching to swimming, water aerobics or other low-impact activities, at least for a few days a week. Sometimes limiting high-impact activities will provide relief.  Make sure your shoes fit well. Choose footwear that is right for your sport.  Protect your knees. Use the proper gear for knee-sensitive activities. Use kneepads when playing volleyball or laying carpet. Buckle your seat belt every time you drive. Most shattered kneecaps occur in car accidents.  Rest when you are tired. SEEK MEDICAL CARE IF:  You have knee pain that is continual and does not seem to be getting better.  SEEK IMMEDIATE MEDICAL CARE IF:  Your knee joint feels hot to the touch and you have a high fever. MAKE SURE YOU:   Understand these instructions.  Will watch your condition.  Will get help right away if you are not doing well or get worse. Document Released: 01/06/2007 Document Revised: 06/03/2011 Document Reviewed: 01/06/2007 [facility] Patient Information 2013 [employer] [place], [place].    Neta Mends. Panosh M.D.  After pt left pt due for labs in February

## 2012-03-17 NOTE — Patient Instructions (Signed)
Plan refer to dr fields and his sports medicine clinic.  About your knee x ray and certain exercises may e advised.   Usually blood tests for rheumatic disease is not advised if not having typical symptoms  Because there can be false positive and negative tests and are not helpful for health.   We will get  Rheumatology  Consult because of the advise fot you to get tests with blood tests.    Knee Pain The knee is the complex joint between your thigh and your lower leg. It is made up of bones, tendons, ligaments, and cartilage. The bones that make up the knee are:  The femur in the thigh.  The tibia and fibula in the lower leg.  The patella or kneecap riding in the groove on the lower femur. CAUSES  Knee pain is a common complaint with many causes. A few of these causes are:  Injury, such as:  A ruptured ligament or tendon injury.  Torn cartilage.  Medical conditions, such as:  Gout  Arthritis  Infections  Overuse, over training or overdoing a physical activity. Knee pain can be minor or severe. Knee pain can accompany debilitating injury. Minor knee problems often respond well to self-care measures or get well on their own. More serious injuries may need medical intervention or even surgery. SYMPTOMS The knee is complex. Symptoms of knee problems can vary widely. Some of the problems are:  Pain with movement and weight bearing.  Swelling and tenderness.  Buckling of the knee.  Inability to straighten or extend your knee.  Your knee locks and you cannot straighten it.  Warmth and redness with pain and fever.  Deformity or dislocation of the kneecap. DIAGNOSIS  Determining what is wrong may be very straight forward such as when there is an injury. It can also be challenging because of the complexity of the knee. Tests to make a diagnosis may include:  Your caregiver taking a history and doing a physical exam.  Routine X-rays can be used to rule out other problems.  X-rays will not reveal a cartilage tear. Some injuries of the knee can be diagnosed by:  Arthroscopy a surgical technique by which a small video camera is inserted through tiny incisions on the sides of the knee. This procedure is used to examine and repair internal knee joint problems. Tiny instruments can be used during arthroscopy to repair the torn knee cartilage (meniscus).  Arthrography is a radiology technique. A contrast liquid is directly injected into the knee joint. Internal structures of the knee joint then become visible on X-ray film.  An MRI scan is a non x-ray radiology procedure in which magnetic fields and a computer produce two- or three-dimensional images of the inside of the knee. Cartilage tears are often visible using an MRI scanner. MRI scans have largely replaced arthrography in diagnosing cartilage tears of the knee.  Blood work.  Examination of the fluid that helps to lubricate the knee joint (synovial fluid). This is done by taking a sample out using a needle and a syringe. TREATMENT The treatment of knee problems depends on the cause. Some of these treatments are:  Depending on the injury, proper casting, splinting, surgery or physical therapy care will be needed.  Give yourself adequate recovery time. Do not overuse your joints. If you begin to get sore during workout routines, back off. Slow down or do fewer repetitions.  For repetitive activities such as cycling or running, maintain your strength and nutrition.  Alternate muscle groups. For example if you are a weight lifter, work the upper body on one day and the lower body the next.  Either tight or weak muscles do not give the proper support for your knee. Tight or weak muscles do not absorb the stress placed on the knee joint. Keep the muscles surrounding the knee strong.  Take care of mechanical problems.  If you have flat feet, orthotics or special shoes may help. See your caregiver if you need  help.  Arch supports, sometimes with wedges on the inner or outer aspect of the heel, can help. These can shift pressure away from the side of the knee most bothered by osteoarthritis.  A brace called an "unloader" brace also may be used to help ease the pressure on the most arthritic side of the knee.  If your caregiver has prescribed crutches, braces, wraps or ice, use as directed. The acronym for this is PRICE. This means protection, rest, ice, compression and elevation.  Nonsteroidal anti-inflammatory drugs (NSAID's), can help relieve pain. But if taken immediately after an injury, they may actually increase swelling. Take NSAID's with food in your stomach. Stop them if you develop stomach problems. Do not take these if you have a history of ulcers, stomach pain or bleeding from the bowel. Do not take without your caregiver's approval if you have problems with fluid retention, heart failure, or kidney problems.  For ongoing knee problems, physical therapy may be helpful.  Glucosamine and chondroitin are over-the-counter dietary supplements. Both may help relieve the pain of osteoarthritis in the knee. These medicines are different from the usual anti-inflammatory drugs. Glucosamine may decrease the rate of cartilage destruction.  Injections of a corticosteroid drug into your knee joint may help reduce the symptoms of an arthritis flare-up. They may provide pain relief that lasts a few months. You may have to wait a few months between injections. The injections do have a small increased risk of infection, water retention and elevated blood sugar levels.  Hyaluronic acid injected into damaged joints may ease pain and provide lubrication. These injections may work by reducing inflammation. A series of shots may give relief for as long as 6 months.  Topical painkillers. Applying certain ointments to your skin may help relieve the pain and stiffness of osteoarthritis. Ask your pharmacist for  suggestions. Many over the-counter products are approved for temporary relief of arthritis pain.  In some countries, doctors often prescribe topical NSAID's for relief of chronic conditions such as arthritis and tendinitis. A review of treatment with NSAID creams found that they worked as well as oral medications but without the serious side effects. PREVENTION  Maintain a healthy weight. Extra pounds put more strain on your joints.  Get strong, stay limber. Weak muscles are a common cause of knee injuries. Stretching is important. Include flexibility exercises in your workouts.  Be smart about exercise. If you have osteoarthritis, chronic knee pain or recurring injuries, you may need to change the way you exercise. This does not mean you have to stop being active. If your knees ache after jogging or playing basketball, consider switching to swimming, water aerobics or other low-impact activities, at least for a few days a week. Sometimes limiting high-impact activities will provide relief.  Make sure your shoes fit well. Choose footwear that is right for your sport.  Protect your knees. Use the proper gear for knee-sensitive activities. Use kneepads when playing volleyball or laying carpet. Buckle your seat belt every time you  drive. Most shattered kneecaps occur in car accidents.  Rest when you are tired. SEEK MEDICAL CARE IF:  You have knee pain that is continual and does not seem to be getting better.  SEEK IMMEDIATE MEDICAL CARE IF:  Your knee joint feels hot to the touch and you have a high fever. MAKE SURE YOU:   Understand these instructions.  Will watch your condition.  Will get help right away if you are not doing well or get worse. Document Released: 01/06/2007 Document Revised: 06/03/2011 Document Reviewed: 01/06/2007 [facility] Patient Information 2013 [place], [place].

## 2012-03-23 ENCOUNTER — Encounter: Payer: Self-pay | Admitting: Internal Medicine

## 2012-04-28 ENCOUNTER — Other Ambulatory Visit: Payer: Self-pay | Admitting: Internal Medicine

## 2012-04-28 DIAGNOSIS — Z1231 Encounter for screening mammogram for malignant neoplasm of breast: Secondary | ICD-10-CM

## 2012-05-09 ENCOUNTER — Other Ambulatory Visit: Payer: Self-pay

## 2012-06-01 ENCOUNTER — Ambulatory Visit (HOSPITAL_COMMUNITY)
Admission: RE | Admit: 2012-06-01 | Discharge: 2012-06-01 | Disposition: A | Discharge status: Home / Self Care | Payer: BC Managed Care – PPO | Source: Ambulatory Visit | Attending: Internal Medicine | Admitting: Internal Medicine

## 2012-06-01 DIAGNOSIS — Z1231 Encounter for screening mammogram for malignant neoplasm of breast: Secondary | ICD-10-CM | POA: Insufficient documentation

## 2012-06-22 ENCOUNTER — Other Ambulatory Visit (INDEPENDENT_AMBULATORY_CARE_PROVIDER_SITE_OTHER): Payer: BC Managed Care – PPO

## 2012-06-22 DIAGNOSIS — Z0000 Encounter for general adult medical examination without abnormal findings: Secondary | ICD-10-CM

## 2012-06-22 LAB — BASIC METABOLIC PANEL
Calcium: 8.9 mg/dL (ref 8.4–10.5)
GFR: 107.78 mL/min (ref >60.00)
Glucose, Bld: 90 mg/dL (ref 70–99)
Potassium: 4 mEq/L (ref 3.5–5.1)
Sodium: 138 mEq/L (ref 135–145)

## 2012-06-22 LAB — LIPID PANEL
HDL: 48.4 mg/dL (ref >39.00)
Triglycerides: 58 mg/dL (ref 0.0–149.0)
VLDL: 11.6 mg/dL (ref 0.0–40.0)

## 2012-06-22 LAB — CBC WITH DIFFERENTIAL/PLATELET
Basophils Absolute: 0 10*3/uL (ref 0.0–0.1)
Eosinophils Relative: 2.4 % (ref 0.0–5.0)
Monocytes Absolute: 0.5 10*3/uL (ref 0.1–1.0)
Monocytes Relative: 7.2 % (ref 3.0–12.0)
Neutrophils Relative %: 68.6 % (ref 43.0–77.0)
Platelets: 277 10*3/uL (ref 150.0–400.0)
RDW: 12.2 % (ref 11.5–14.6)
WBC: 7.6 10*3/uL (ref 4.5–10.5)

## 2012-06-22 LAB — HEPATIC FUNCTION PANEL
ALT: 21 U/L (ref 0–35)
AST: 22 U/L (ref 0–37)
Albumin: 3.9 g/dL (ref 3.5–5.2)
Alkaline Phosphatase: 52 U/L (ref 39–117)
Total Protein: 7.2 g/dL (ref 6.0–8.3)

## 2012-06-22 LAB — TSH: TSH: 0.91 u[IU]/mL (ref 0.35–5.50)

## 2012-07-01 ENCOUNTER — Encounter: Payer: Self-pay | Admitting: Internal Medicine

## 2012-07-01 ENCOUNTER — Ambulatory Visit (INDEPENDENT_AMBULATORY_CARE_PROVIDER_SITE_OTHER): Payer: BC Managed Care – PPO | Admitting: Internal Medicine

## 2012-07-01 VITALS — BP 90/62 | HR 68 | Temp 98.9°F | Ht 61.75 in | Wt 122.0 lb

## 2012-07-01 DIAGNOSIS — E785 Hyperlipidemia, unspecified: Secondary | ICD-10-CM

## 2012-07-01 DIAGNOSIS — Z0000 Encounter for general adult medical examination without abnormal findings: Secondary | ICD-10-CM

## 2012-07-01 DIAGNOSIS — E039 Hypothyroidism, unspecified: Secondary | ICD-10-CM

## 2012-07-01 NOTE — Progress Notes (Signed)
Chief Complaint  Patient presents with  . Annual Exam    HPI: Patient comes in today for Preventive Health Care visit  No major change in health status since last visit . Feels healthy  LIPIDS no se of meds using condoms careful for preg prevention THyroid no change meds doing well. Knees are better after nsaid rx and no recent exercise  Now had 42 yo in kindergarten .  thinks utd on pap ROS:  GEN/ HEENT: No fever, significant weight changes sweats headaches vision problems hearing changes, CV/ PULM; No chest pain shortness of breath cough, syncope,edema  change in exercise tolerance. GI /GU: No adominal pain, vomiting, change in bowel habits. No blood in the stool. No significant GU symptoms. SKIN/HEME: ,no acute skin rashes suspicious lesions or bleeding. No lymphadenopathy, nodules, masses.  NEURO/ PSYCH:  No neurologic signs such as weakness numbness. No depression anxiety. IMM/ Allergy: No unusual infections.  Allergy .   REST of 12 system review negative except as per HPI   Past Medical History  Diagnosis Date  . Hypothyroidism   . Panic attacks     on meds pre pregnancy/hx  . Pancreatitis     2000 and 2005  . HELLP (hemolytic anemia/elev liver enzymes/low platelets in pregnancy)     c-section 2007  . Hyperlipidemia     Family History  Problem Relation Age of Onset  . Heart attack Father     in 37s   . Hyperlipidemia Father   . Diabetes      gm and gf  . Hypertension Father     gm and gf  . Arthritis      gp ? OA  . Hyperlipidemia Mother   . Colon cancer Neg Hx   . Stomach cancer Neg Hx   . CVA Father     arrynthmia ? af  age 50     History   Social History  . Marital Status: Married    Spouse Name: N/A    Number of Children: N/A  . Years of Education: N/A   Social History Main Topics  . Smoking status: Never Smoker   . Smokeless tobacco: Never Used  . Alcohol Use: No  . Drug Use: No  . Sexually Active: None   Other Topics Concern  . None    Social History Narrative   HH of 3 pet  Dog  Rabbit  And 4 fish    Young child at home   No ets   No caffeine   Drink milk 2-3 x per day    reg exercise    [place]          Outpatient Encounter Prescriptions as of 07/01/2012  Medication Sig Dispense Refill  . atorvastatin (LIPITOR) 10 MG tablet Take 1 tablet (10 mg total) by mouth daily.  90 tablet  0  . levothyroxine (SYNTHROID, LEVOTHROID) 50 MCG tablet Take 1 tablet (50 mcg total) by mouth daily.  90 tablet  0   No facility-administered encounter medications on file as of 07/01/2012.    EXAM:  BP 90/62  Pulse 68  Temp(Src) 98.9 F (37.2 C) (Oral)  Ht 5' 1.75" (1.568 m)  Wt 122 lb (55.339 kg)  BMI 22.51 kg/m2  SpO2 99%  LMP 05/19/2012  Body mass index is 22.51 kg/(m^2).  Physical Exam: Vital signs reviewed WGN:FAOZ is a well-developed well-nourished alert cooperative   female who appears her stated age in no acute distress.  HEENT: normocephalic atraumatic ,  Eyes: PERRL EOM's full, conjunctiva clear, Nares: paten,t no deformity discharge or tenderness., Ears: no deformity EAC's clear TMs with normal landmarks. Mouth: clear OP, no lesions, edema.  Moist mucous membranes. Dentition in adequate repair. Braces  Wears glasses NECK: supple without masses, thyromegaly or bruits. Breast: normal by inspection . No dimpling, discharge, masses, tenderness or discharge . CHEST/PULM:  Clear to auscultation and percussion breath sounds equal no wheeze , rales or rhonchi. No chest wall deformities or tenderness. CV: PMI is nondisplaced, S1 S2 no gallops, murmurs, rubs. Peripheral pulses are full without delay.No JVD .  ABDOMEN: Bowel sounds normal nontender  No guard or rebound, no hepato splenomegal no CVA tenderness.  No hernia. Extremtities:  No clubbing cyanosis or edema, no acute joint swelling or redness no focal atrophy NEURO:  Oriented x3, cranial nerves 3-12 appear to be intact, no obvious focal weakness,gait within  normal limits no abnormal reflexes or asymmetrical SKIN: No acute rashes normal turgor, color, no bruising or petechiae. PSYCH: Oriented, good eye contact, no obvious depression anxiety, cognition and judgment appear normal. LN: no cervical axillary inguinal adenopathy  Lab Results  Component Value Date   WBC 7.6 06/22/2012   HGB 12.0 06/22/2012   HCT 35.1* 06/22/2012   PLT 277.0 06/22/2012   GLUCOSE 90 06/22/2012   CHOL 141 06/22/2012   TRIG 58.0 06/22/2012   HDL 48.40 06/22/2012   LDLDIRECT 173.7 02/20/2011   LDLCALC 81 06/22/2012   ALT 21 06/22/2012   AST 22 06/22/2012   NA 138 06/22/2012   K 4.0 06/22/2012   CL 104 06/22/2012   CREATININE 0.6 06/22/2012   BUN 16 06/22/2012   CO2 26 06/22/2012   TSH 0.91 06/22/2012   Labs reviewed with pt  ASSESSMENT AND PLAN:  Discussed the following assessment and plan:  Visit for preventive health examination - pap at gyne   HYPOTHYROIDISM - euthyroid  HYPERLIPIDEMIA - at goal Disc breast cancer risks and prevention and screening. Neg fam hx .  Patient Care Team: Madelin Headings, MD as PCP - General Hal Morales, MD (Obstetrics and Gynecology) Patient Instructions  Continue lifestyle intervention healthy eating and exercise . Do not get pregnant on the lipitor . Have pharmacy contact us for refills of meds. Can view labs on line. Make sure PAP is up to date  Usually  Every 3 years. Preventive visit and labs in a year or as needed.   Preventive Care for Adults, Female A healthy lifestyle and preventive care can promote health and wellness. Preventive health guidelines for women include the following key practices.  A routine yearly physical is a good way to check with your caregiver about your health and preventive screening. It is a chance to share any concerns and updates on your health, and to receive a thorough exam.  Visit your dentist for a routine exam and preventive care every 6 months. Brush your teeth twice a day and floss once  a day. Good oral hygiene prevents tooth decay and gum disease.  The frequency of eye exams is based on your age, health, family medical history, use of contact lenses, and other factors. Follow your caregiver's recommendations for frequency of eye exams.  Eat a healthy diet. Foods like vegetables, fruits, whole grains, low-fat dairy products, and lean protein foods contain the nutrients you need without too many calories. Decrease your intake of foods high in solid fats, added sugars, and salt. Eat the right amount of calories for you.Get information about a  proper diet from your caregiver, if necessary.  Regular physical exercise is one of the most important things you can do for your health. Most adults should get at least 150 minutes of moderate-intensity exercise (any activity that increases your heart rate and causes you to sweat) each week. In addition, most adults need muscle-strengthening exercises on 2 or more days a week.  Maintain a healthy weight. The body mass index (BMI) is a screening tool to identify possible weight problems. It provides an estimate of body fat based on height and weight. Your caregiver can help determine your BMI, and can help you achieve or maintain a healthy weight.For adults 20 years and older:  A BMI below 18.5 is considered underweight.  A BMI of 18.5 to 24.9 is normal.  A BMI of 25 to 29.9 is considered overweight.  A BMI of 30 and above is considered obese.  Maintain normal blood lipids and cholesterol levels by exercising and minimizing your intake of saturated fat. Eat a balanced diet with plenty of fruit and vegetables. Blood tests for lipids and cholesterol should begin at age 23 and be repeated every 5 years. If your lipid or cholesterol levels are high, you are over 50, or you are at high risk for heart disease, you may need your cholesterol levels checked more frequently.Ongoing high lipid and cholesterol levels should be treated with medicines if  diet and exercise are not effective.  If you smoke, find out from your caregiver how to quit. If you do not use tobacco, do not start.  If you are pregnant, do not drink alcohol. If you are breastfeeding, be very cautious about drinking alcohol. If you are not pregnant and choose to drink alcohol, do not exceed 1 drink per day. One drink is considered to be 12 ounces (355 mL) of beer, 5 ounces (148 mL) of wine, or 1.5 ounces (44 mL) of liquor.  Avoid use of street drugs. Do not share needles with anyone. Ask for help if you need support or instructions about stopping the use of drugs.  High blood pressure causes heart disease and increases the risk of stroke. Your blood pressure should be checked at least every 1 to 2 years. Ongoing high blood pressure should be treated with medicines if weight loss and exercise are not effective.  If you are 80 to 43 years old, ask your caregiver if you should take aspirin to prevent strokes.  Diabetes screening involves taking a blood sample to check your fasting blood sugar level. This should be done once every 3 years, after age 36, if you are within normal weight and without risk factors for diabetes. Testing should be considered at a younger age or be carried out more frequently if you are overweight and have at least 1 risk factor for diabetes.  Breast cancer screening is essential preventive care for women. You should practice "breast self-awareness." This means understanding the normal appearance and feel of your breasts and may include breast self-examination. Any changes detected, no matter how small, should be reported to a caregiver. Women in their 62s and 30s should have a clinical breast exam (CBE) by a caregiver as part of a regular health exam every 1 to 3 years. After age 51, women should have a CBE every year. Starting at age 28, women should consider having a mammography (breast X-ray test) every year. Women who have a family history of breast  cancer should talk to their caregiver about genetic screening. Women at a  high risk of breast cancer should talk to their caregivers about having magnetic resonance imaging (MRI) and a mammography every year.  The Pap test is a screening test for cervical cancer. A Pap test can show cell changes on the cervix that might become cervical cancer if left untreated. A Pap test is a procedure in which cells are obtained and examined from the lower end of the uterus (cervix).  Women should have a Pap test starting at age 74.  Between ages 22 and 61, Pap tests should be repeated every 2 years.  Beginning at age 89, you should have a Pap test every 3 years as long as the past 3 Pap tests have been normal.  Some women have medical problems that increase the chance of getting cervical cancer. Talk to your caregiver about these problems. It is especially important to talk to your caregiver if a new problem develops soon after your last Pap test. In these cases, your caregiver may recommend more frequent screening and Pap tests.  The above recommendations are the same for women who have or have not gotten the vaccine for human papillomavirus (HPV).  If you had a hysterectomy for a problem that was not cancer or a condition that could lead to cancer, then you no longer need Pap tests. Even if you no longer need a Pap test, a regular exam is a good idea to make sure no other problems are starting.  If you are between ages 6 and 44, and you have had normal Pap tests going back 10 years, you no longer need Pap tests. Even if you no longer need a Pap test, a regular exam is a good idea to make sure no other problems are starting.  If you have had past treatment for cervical cancer or a condition that could lead to cancer, you need Pap tests and screening for cancer for at least 20 years after your treatment.  If Pap tests have been discontinued, risk factors (such as a new sexual partner) need to be reassessed to  determine if screening should be resumed.  The HPV test is an additional test that may be used for cervical cancer screening. The HPV test looks for the virus that can cause the cell changes on the cervix. The cells collected during the Pap test can be tested for HPV. The HPV test could be used to screen women aged 55 years and older, and should be used in women of any age who have unclear Pap test results. After the age of 44, women should have HPV testing at the same frequency as a Pap test.  Colorectal cancer can be detected and often prevented. Most routine colorectal cancer screening begins at the age of 48 and continues through age 35. However, your caregiver may recommend screening at an earlier age if you have risk factors for colon cancer. On a yearly basis, your caregiver may provide home test kits to check for hidden blood in the stool. Use of a small camera at the end of a tube, to directly examine the colon (sigmoidoscopy or colonoscopy), can detect the earliest forms of colorectal cancer. Talk to your caregiver about this at age 82, when routine screening begins. Direct examination of the colon should be repeated every 5 to 10 years through age 32, unless early forms of pre-cancerous polyps or small growths are found.  Hepatitis C blood testing is recommended for all people born from [birth year] through 1965 and any individual with known risks  for hepatitis C.  Practice safe sex. Use condoms and avoid high-risk sexual practices to reduce the spread of sexually transmitted infections (STIs). STIs include gonorrhea, chlamydia, syphilis, trichomonas, herpes, HPV, and human immunodeficiency virus (HIV). Herpes, HIV, and HPV are viral illnesses that have no cure. They can result in disability, cancer, and death. Sexually active women aged 37 and younger should be checked for chlamydia. Older women with new or multiple partners should also be tested for chlamydia. Testing for other STIs is recommended if  you are sexually active and at increased risk.  Osteoporosis is a disease in which the bones lose minerals and strength with aging. This can result in serious bone fractures. The risk of osteoporosis can be identified using a bone density scan. Women ages 62 and over and women at risk for fractures or osteoporosis should discuss screening with their caregivers. Ask your caregiver whether you should take a calcium supplement or vitamin D to reduce the rate of osteoporosis.  Menopause can be associated with physical symptoms and risks. Hormone replacement therapy is available to decrease symptoms and risks. You should talk to your caregiver about whether hormone replacement therapy is right for you.  Use sunscreen with sun protection factor (SPF) of 30 or more. Apply sunscreen liberally and repeatedly throughout the day. You should seek shade when your shadow is shorter than you. Protect yourself by wearing long sleeves, pants, a wide-brimmed hat, and sunglasses year round, whenever you are outdoors.  Once a month, do a whole body skin exam, using a mirror to look at the skin on your back. Notify your caregiver of new moles, moles that have irregular borders, moles that are larger than a pencil eraser, or moles that have changed in shape or color.  Stay current with required immunizations.  Influenza. You need a dose every fall (or winter). The composition of the flu vaccine changes each year, so being vaccinated once is not enough.  Pneumococcal polysaccharide. You need 1 to 2 doses if you smoke cigarettes or if you have certain chronic medical conditions. You need 1 dose at age 59 (or older) if you have never been vaccinated.  Tetanus, diphtheria, pertussis (Tdap, Td). Get 1 dose of Tdap vaccine if you are younger than age 71, are over 45 and have contact with an infant, are a Research scientist (physical sciences), are pregnant, or simply want to be protected from whooping cough. After that, you need a Td booster dose  every 10 years. Consult your caregiver if you have not had at least 3 tetanus and diphtheria-containing shots sometime in your life or have a deep or dirty wound.  HPV. You need this vaccine if you are a woman age 44 or younger. The vaccine is given in 3 doses over 6 months.  Measles, mumps, rubella (MMR). You need at least 1 dose of MMR if you were born in 1957 or later. You may also need a second dose.  Meningococcal. If you are age 54 to 36 and a first-year college student living in a residence hall, or have one of several medical conditions, you need to get vaccinated against meningococcal disease. You may also need additional booster doses.  Zoster (shingles). If you are age 34 or older, you should get this vaccine.  Varicella (chickenpox). If you have never had chickenpox or you were vaccinated but received only 1 dose, talk to your caregiver to find out if you need this vaccine.  Hepatitis A. You need this vaccine if you have a  specific risk factor for hepatitis A virus infection or you simply wish to be protected from this disease. The vaccine is usually given as 2 doses, 6 to 18 months apart.  Hepatitis B. You need this vaccine if you have a specific risk factor for hepatitis B virus infection or you simply wish to be protected from this disease. The vaccine is given in 3 doses, usually over 6 months. Preventive Services / Frequency Ages 36 to 75  Blood pressure check.** / Every 1 to 2 years.  Lipid and cholesterol check.** / Every 5 years beginning at age 47.  Clinical breast exam.** / Every 3 years for women in their 49s and 30s.  Pap test.** / Every 2 years from ages 34 through 69. Every 3 years starting at age 62 through age 59 or 63 with a history of 3 consecutive normal Pap tests.  HPV screening.** / Every 3 years from ages 2 through ages 58 to 31 with a history of 3 consecutive normal Pap tests.  Hepatitis C blood test.** / For any individual with known risks for  hepatitis C.  Skin self-exam. / Monthly.  Influenza immunization.** / Every year.  Pneumococcal polysaccharide immunization.** / 1 to 2 doses if you smoke cigarettes or if you have certain chronic medical conditions.  Tetanus, diphtheria, pertussis (Tdap, Td) immunization. / A one-time dose of Tdap vaccine. After that, you need a Td booster dose every 10 years.  HPV immunization. / 3 doses over 6 months, if you are 6 and younger.  Measles, mumps, rubella (MMR) immunization. / You need at least 1 dose of MMR if you were born in 1957 or later. You may also need a second dose.  Meningococcal immunization. / 1 dose if you are age 37 to 13 and a first-year college student living in a residence hall, or have one of several medical conditions, you need to get vaccinated against meningococcal disease. You may also need additional booster doses.  Varicella immunization.** / Consult your caregiver.  Hepatitis A immunization.** / Consult your caregiver. 2 doses, 6 to 18 months apart.  Hepatitis B immunization.** / Consult your caregiver. 3 doses usually over 6 months. Ages 53 to 11  Blood pressure check.** / Every 1 to 2 years.  Lipid and cholesterol check.** / Every 5 years beginning at age 85.  Clinical breast exam.** / Every year after age 50.  Mammogram.** / Every year beginning at age 96 and continuing for as long as you are in good health. Consult with your caregiver.  Pap test.** / Every 3 years starting at age 72 through age 70 or 101 with a history of 3 consecutive normal Pap tests.  HPV screening.** / Every 3 years from ages 57 through ages 72 to 42 with a history of 3 consecutive normal Pap tests.  Fecal occult blood test (FOBT) of stool. / Every year beginning at age 33 and continuing until age 36. You may not need to do this test if you get a colonoscopy every 10 years.  Flexible sigmoidoscopy or colonoscopy.** / Every 5 years for a flexible sigmoidoscopy or every 10 years for  a colonoscopy beginning at age 75 and continuing until age 50.  Hepatitis C blood test.** / For all people born from [birth year] through 1966 and any individual with known risks for hepatitis C.  Skin self-exam. / Monthly.  Influenza immunization.** / Every year.  Pneumococcal polysaccharide immunization.** / 1 to 2 doses if you smoke cigarettes or if you have  certain chronic medical conditions.  Tetanus, diphtheria, pertussis (Tdap, Td) immunization.** / A one-time dose of Tdap vaccine. After that, you need a Td booster dose every 10 years.  Measles, mumps, rubella (MMR) immunization. / You need at least 1 dose of MMR if you were born in 1957 or later. You may also need a second dose.  Varicella immunization.** / Consult your caregiver.  Meningococcal immunization.** / Consult your caregiver.  Hepatitis A immunization.** / Consult your caregiver. 2 doses, 6 to 18 months apart.  Hepatitis B immunization.** / Consult your caregiver. 3 doses, usually over 6 months. Ages 32 and over  Blood pressure check.** / Every 1 to 2 years.  Lipid and cholesterol check.** / Every 5 years beginning at age 42.  Clinical breast exam.** / Every year after age 34.  Mammogram.** / Every year beginning at age 74 and continuing for as long as you are in good health. Consult with your caregiver.  Pap test.** / Every 3 years starting at age 2 through age 33 or 47 with a 3 consecutive normal Pap tests. Testing can be stopped between 65 and 70 with 3 consecutive normal Pap tests and no abnormal Pap or HPV tests in the past 10 years.  HPV screening.** / Every 3 years from ages 34 through ages 45 or 79 with a history of 3 consecutive normal Pap tests. Testing can be stopped between 65 and 70 with 3 consecutive normal Pap tests and no abnormal Pap or HPV tests in the past 10 years.  Fecal occult blood test (FOBT) of stool. / Every year beginning at age 60 and continuing until age 22. You may not need to do this test  if you get a colonoscopy every 10 years.  Flexible sigmoidoscopy or colonoscopy.** / Every 5 years for a flexible sigmoidoscopy or every 10 years for a colonoscopy beginning at age 45 and continuing until age 83.  Hepatitis C blood test.** / For all people born from [birth year] through 1965 and any individual with known risks for hepatitis C.  Osteoporosis screening.** / A one-time screening for women ages 28 and over and women at risk for fractures or osteoporosis.  Skin self-exam. / Monthly.  Influenza immunization.** / Every year.  Pneumococcal polysaccharide immunization.** / 1 dose at age 34 (or older) if you have never been vaccinated.  Tetanus, diphtheria, pertussis (Tdap, Td) immunization. / A one-time dose of Tdap vaccine if you are over 65 and have contact with an infant, are a Research scientist (physical sciences), or simply want to be protected from whooping cough. After that, you need a Td booster dose every 10 years.  Varicella immunization.** / Consult your caregiver.  Meningococcal immunization.** / Consult your caregiver.  Hepatitis A immunization.** / Consult your caregiver. 2 doses, 6 to 18 months apart.  Hepatitis B immunization.** / Check with your caregiver. 3 doses, usually over 6 months. ** Family history and personal history of risk and conditions may change your caregiver's recommendations. Document Released: 05/07/2001 Document Revised: 06/03/2011 Document Reviewed: 08/06/2010 [facility] Patient Information 2013 [place], [place].     Neta Mends. Panosh M.D.  Health Maintenance  Topic Date Due  . Influenza Vaccine  11/23/2012  . Pap Smear  06/06/2015  . Tetanus/tdap  02/26/2021   Health Maintenance Review

## 2012-07-01 NOTE — Patient Instructions (Addendum)
Continue lifestyle intervention healthy eating and exercise . Do not get pregnant on the lipitor . Have pharmacy contact us for refills of meds. Can view labs on line. Make sure PAP is up to date  Usually  Every 3 years. Preventive visit and labs in a year or as needed.   Preventive Care for Adults, Female A healthy lifestyle and preventive care can promote health and wellness. Preventive health guidelines for women include the following key practices.  A routine yearly physical is a good way to check with your caregiver about your health and preventive screening. It is a chance to share any concerns and updates on your health, and to receive a thorough exam.  Visit your dentist for a routine exam and preventive care every 6 months. Brush your teeth twice a day and floss once a day. Good oral hygiene prevents tooth decay and gum disease.  The frequency of eye exams is based on your age, health, family medical history, use of contact lenses, and other factors. Follow your caregiver's recommendations for frequency of eye exams.  Eat a healthy diet. Foods like vegetables, fruits, whole grains, low-fat dairy products, and lean protein foods contain the nutrients you need without too many calories. Decrease your intake of foods high in solid fats, added sugars, and salt. Eat the right amount of calories for you.Get information about a proper diet from your caregiver, if necessary.  Regular physical exercise is one of the most important things you can do for your health. Most adults should get at least 150 minutes of moderate-intensity exercise (any activity that increases your heart rate and causes you to sweat) each week. In addition, most adults need muscle-strengthening exercises on 2 or more days a week.  Maintain a healthy weight. The body mass index (BMI) is a screening tool to identify possible weight problems. It provides an estimate of body fat based on height and weight. Your caregiver can  help determine your BMI, and can help you achieve or maintain a healthy weight.For adults 20 years and older:  A BMI below 18.5 is considered underweight.  A BMI of 18.5 to 24.9 is normal.  A BMI of 25 to 29.9 is considered overweight.  A BMI of 30 and above is considered obese.  Maintain normal blood lipids and cholesterol levels by exercising and minimizing your intake of saturated fat. Eat a balanced diet with plenty of fruit and vegetables. Blood tests for lipids and cholesterol should begin at age 39 and be repeated every 5 years. If your lipid or cholesterol levels are high, you are over 50, or you are at high risk for heart disease, you may need your cholesterol levels checked more frequently.Ongoing high lipid and cholesterol levels should be treated with medicines if diet and exercise are not effective.  If you smoke, find out from your caregiver how to quit. If you do not use tobacco, do not start.  If you are pregnant, do not drink alcohol. If you are breastfeeding, be very cautious about drinking alcohol. If you are not pregnant and choose to drink alcohol, do not exceed 1 drink per day. One drink is considered to be 12 ounces (355 mL) of beer, 5 ounces (148 mL) of wine, or 1.5 ounces (44 mL) of liquor.  Avoid use of street drugs. Do not share needles with anyone. Ask for help if you need support or instructions about stopping the use of drugs.  High blood pressure causes heart disease and increases the risk  of stroke. Your blood pressure should be checked at least every 1 to 2 years. Ongoing high blood pressure should be treated with medicines if weight loss and exercise are not effective.  If you are 11 to 43 years old, ask your caregiver if you should take aspirin to prevent strokes.  Diabetes screening involves taking a blood sample to check your fasting blood sugar level. This should be done once every 3 years, after age 46, if you are within normal weight and without risk  factors for diabetes. Testing should be considered at a younger age or be carried out more frequently if you are overweight and have at least 1 risk factor for diabetes.  Breast cancer screening is essential preventive care for women. You should practice "breast self-awareness." This means understanding the normal appearance and feel of your breasts and may include breast self-examination. Any changes detected, no matter how small, should be reported to a caregiver. Women in their 13s and 30s should have a clinical breast exam (CBE) by a caregiver as part of a regular health exam every 1 to 3 years. After age 35, women should have a CBE every year. Starting at age 62, women should consider having a mammography (breast X-ray test) every year. Women who have a family history of breast cancer should talk to their caregiver about genetic screening. Women at a high risk of breast cancer should talk to their caregivers about having magnetic resonance imaging (MRI) and a mammography every year.  The Pap test is a screening test for cervical cancer. A Pap test can show cell changes on the cervix that might become cervical cancer if left untreated. A Pap test is a procedure in which cells are obtained and examined from the lower end of the uterus (cervix).  Women should have a Pap test starting at age 54.  Between ages 64 and 20, Pap tests should be repeated every 2 years.  Beginning at age 103, you should have a Pap test every 3 years as long as the past 3 Pap tests have been normal.  Some women have medical problems that increase the chance of getting cervical cancer. Talk to your caregiver about these problems. It is especially important to talk to your caregiver if a new problem develops soon after your last Pap test. In these cases, your caregiver may recommend more frequent screening and Pap tests.  The above recommendations are the same for women who have or have not gotten the vaccine for human  papillomavirus (HPV).  If you had a hysterectomy for a problem that was not cancer or a condition that could lead to cancer, then you no longer need Pap tests. Even if you no longer need a Pap test, a regular exam is a good idea to make sure no other problems are starting.  If you are between ages 2 and 69, and you have had normal Pap tests going back 10 years, you no longer need Pap tests. Even if you no longer need a Pap test, a regular exam is a good idea to make sure no other problems are starting.  If you have had past treatment for cervical cancer or a condition that could lead to cancer, you need Pap tests and screening for cancer for at least 20 years after your treatment.  If Pap tests have been discontinued, risk factors (such as a new sexual partner) need to be reassessed to determine if screening should be resumed.  The HPV test is an  additional test that may be used for cervical cancer screening. The HPV test looks for the virus that can cause the cell changes on the cervix. The cells collected during the Pap test can be tested for HPV. The HPV test could be used to screen women aged 63 years and older, and should be used in women of any age who have unclear Pap test results. After the age of 53, women should have HPV testing at the same frequency as a Pap test.  Colorectal cancer can be detected and often prevented. Most routine colorectal cancer screening begins at the age of 26 and continues through age 57. However, your caregiver may recommend screening at an earlier age if you have risk factors for colon cancer. On a yearly basis, your caregiver may provide home test kits to check for hidden blood in the stool. Use of a small camera at the end of a tube, to directly examine the colon (sigmoidoscopy or colonoscopy), can detect the earliest forms of colorectal cancer. Talk to your caregiver about this at age 37, when routine screening begins. Direct examination of the colon should be  repeated every 5 to 10 years through age 63, unless early forms of pre-cancerous polyps or small growths are found.  Hepatitis C blood testing is recommended for all people born from [birth year] through 1965 and any individual with known risks for hepatitis C.  Practice safe sex. Use condoms and avoid high-risk sexual practices to reduce the spread of sexually transmitted infections (STIs). STIs include gonorrhea, chlamydia, syphilis, trichomonas, herpes, HPV, and human immunodeficiency virus (HIV). Herpes, HIV, and HPV are viral illnesses that have no cure. They can result in disability, cancer, and death. Sexually active women aged 58 and younger should be checked for chlamydia. Older women with new or multiple partners should also be tested for chlamydia. Testing for other STIs is recommended if you are sexually active and at increased risk.  Osteoporosis is a disease in which the bones lose minerals and strength with aging. This can result in serious bone fractures. The risk of osteoporosis can be identified using a bone density scan. Women ages 73 and over and women at risk for fractures or osteoporosis should discuss screening with their caregivers. Ask your caregiver whether you should take a calcium supplement or vitamin D to reduce the rate of osteoporosis.  Menopause can be associated with physical symptoms and risks. Hormone replacement therapy is available to decrease symptoms and risks. You should talk to your caregiver about whether hormone replacement therapy is right for you.  Use sunscreen with sun protection factor (SPF) of 30 or more. Apply sunscreen liberally and repeatedly throughout the day. You should seek shade when your shadow is shorter than you. Protect yourself by wearing long sleeves, pants, a wide-brimmed hat, and sunglasses year round, whenever you are outdoors.  Once a month, do a whole body skin exam, using a mirror to look at the skin on your back. Notify your caregiver of new  moles, moles that have irregular borders, moles that are larger than a pencil eraser, or moles that have changed in shape or color.  Stay current with required immunizations.  Influenza. You need a dose every fall (or winter). The composition of the flu vaccine changes each year, so being vaccinated once is not enough.  Pneumococcal polysaccharide. You need 1 to 2 doses if you smoke cigarettes or if you have certain chronic medical conditions. You need 1 dose at age 33 (or older) if  you have never been vaccinated.  Tetanus, diphtheria, pertussis (Tdap, Td). Get 1 dose of Tdap vaccine if you are younger than age 37, are over 21 and have contact with an infant, are a Research scientist (physical sciences), are pregnant, or simply want to be protected from whooping cough. After that, you need a Td booster dose every 10 years. Consult your caregiver if you have not had at least 3 tetanus and diphtheria-containing shots sometime in your life or have a deep or dirty wound.  HPV. You need this vaccine if you are a woman age 50 or younger. The vaccine is given in 3 doses over 6 months.  Measles, mumps, rubella (MMR). You need at least 1 dose of MMR if you were born in 1957 or later. You may also need a second dose.  Meningococcal. If you are age 30 to 24 and a first-year college student living in a residence hall, or have one of several medical conditions, you need to get vaccinated against meningococcal disease. You may also need additional booster doses.  Zoster (shingles). If you are age 61 or older, you should get this vaccine.  Varicella (chickenpox). If you have never had chickenpox or you were vaccinated but received only 1 dose, talk to your caregiver to find out if you need this vaccine.  Hepatitis A. You need this vaccine if you have a specific risk factor for hepatitis A virus infection or you simply wish to be protected from this disease. The vaccine is usually given as 2 doses, 6 to 18 months apart.  Hepatitis  B. You need this vaccine if you have a specific risk factor for hepatitis B virus infection or you simply wish to be protected from this disease. The vaccine is given in 3 doses, usually over 6 months. Preventive Services / Frequency Ages 23 to 18  Blood pressure check.** / Every 1 to 2 years.  Lipid and cholesterol check.** / Every 5 years beginning at age 76.  Clinical breast exam.** / Every 3 years for women in their 29s and 30s.  Pap test.** / Every 2 years from ages 3 through 41. Every 3 years starting at age 101 through age 93 or 75 with a history of 3 consecutive normal Pap tests.  HPV screening.** / Every 3 years from ages 19 through ages 52 to 63 with a history of 3 consecutive normal Pap tests.  Hepatitis C blood test.** / For any individual with known risks for hepatitis C.  Skin self-exam. / Monthly.  Influenza immunization.** / Every year.  Pneumococcal polysaccharide immunization.** / 1 to 2 doses if you smoke cigarettes or if you have certain chronic medical conditions.  Tetanus, diphtheria, pertussis (Tdap, Td) immunization. / A one-time dose of Tdap vaccine. After that, you need a Td booster dose every 10 years.  HPV immunization. / 3 doses over 6 months, if you are 69 and younger.  Measles, mumps, rubella (MMR) immunization. / You need at least 1 dose of MMR if you were born in 1957 or later. You may also need a second dose.  Meningococcal immunization. / 1 dose if you are age 25 to 67 and a first-year college student living in a residence hall, or have one of several medical conditions, you need to get vaccinated against meningococcal disease. You may also need additional booster doses.  Varicella immunization.** / Consult your caregiver.  Hepatitis A immunization.** / Consult your caregiver. 2 doses, 6 to 18 months apart.  Hepatitis B immunization.** / Consult  your caregiver. 3 doses usually over 6 months. Ages 54 to 9  Blood pressure check.** / Every 1 to 2  years.  Lipid and cholesterol check.** / Every 5 years beginning at age 61.  Clinical breast exam.** / Every year after age 67.  Mammogram.** / Every year beginning at age 40 and continuing for as long as you are in good health. Consult with your caregiver.  Pap test.** / Every 3 years starting at age 89 through age 32 or 44 with a history of 3 consecutive normal Pap tests.  HPV screening.** / Every 3 years from ages 6 through ages 57 to 63 with a history of 3 consecutive normal Pap tests.  Fecal occult blood test (FOBT) of stool. / Every year beginning at age 76 and continuing until age 55. You may not need to do this test if you get a colonoscopy every 10 years.  Flexible sigmoidoscopy or colonoscopy.** / Every 5 years for a flexible sigmoidoscopy or every 10 years for a colonoscopy beginning at age 27 and continuing until age 69.  Hepatitis C blood test.** / For all people born from [birth year] through 1966 and any individual with known risks for hepatitis C.  Skin self-exam. / Monthly.  Influenza immunization.** / Every year.  Pneumococcal polysaccharide immunization.** / 1 to 2 doses if you smoke cigarettes or if you have certain chronic medical conditions.  Tetanus, diphtheria, pertussis (Tdap, Td) immunization.** / A one-time dose of Tdap vaccine. After that, you need a Td booster dose every 10 years.  Measles, mumps, rubella (MMR) immunization. / You need at least 1 dose of MMR if you were born in 1957 or later. You may also need a second dose.  Varicella immunization.** / Consult your caregiver.  Meningococcal immunization.** / Consult your caregiver.  Hepatitis A immunization.** / Consult your caregiver. 2 doses, 6 to 18 months apart.  Hepatitis B immunization.** / Consult your caregiver. 3 doses, usually over 6 months. Ages 33 and over  Blood pressure check.** / Every 1 to 2 years.  Lipid and cholesterol check.** / Every 5 years beginning at age 86.  Clinical breast  exam.** / Every year after age 95.  Mammogram.** / Every year beginning at age 43 and continuing for as long as you are in good health. Consult with your caregiver.  Pap test.** / Every 3 years starting at age 15 through age 43 or 2 with a 3 consecutive normal Pap tests. Testing can be stopped between 65 and 70 with 3 consecutive normal Pap tests and no abnormal Pap or HPV tests in the past 10 years.  HPV screening.** / Every 3 years from ages 64 through ages 16 or 73 with a history of 3 consecutive normal Pap tests. Testing can be stopped between 65 and 70 with 3 consecutive normal Pap tests and no abnormal Pap or HPV tests in the past 10 years.  Fecal occult blood test (FOBT) of stool. / Every year beginning at age 54 and continuing until age 2. You may not need to do this test if you get a colonoscopy every 10 years.  Flexible sigmoidoscopy or colonoscopy.** / Every 5 years for a flexible sigmoidoscopy or every 10 years for a colonoscopy beginning at age 18 and continuing until age 76.  Hepatitis C blood test.** / For all people born from [birth year] through 1966 and any individual with known risks for hepatitis C.  Osteoporosis screening.** / A one-time screening for women ages 64 and over and  women at risk for fractures or osteoporosis.  Skin self-exam. / Monthly.  Influenza immunization.** / Every year.  Pneumococcal polysaccharide immunization.** / 1 dose at age 3 (or older) if you have never been vaccinated.  Tetanus, diphtheria, pertussis (Tdap, Td) immunization. / A one-time dose of Tdap vaccine if you are over 65 and have contact with an infant, are a Research scientist (physical sciences), or simply want to be protected from whooping cough. After that, you need a Td booster dose every 10 years.  Varicella immunization.** / Consult your caregiver.  Meningococcal immunization.** / Consult your caregiver.  Hepatitis A immunization.** / Consult your caregiver. 2 doses, 6 to 18 months apart.  Hepatitis B  immunization.** / Check with your caregiver. 3 doses, usually over 6 months. ** Family history and personal history of risk and conditions may change your caregiver's recommendations. Document Released: 05/07/2001 Document Revised: 06/03/2011 Document Reviewed: 08/06/2010 [facility] Patient Information 2013 [place], [place].

## 2012-08-19 ENCOUNTER — Telehealth: Payer: Self-pay | Admitting: Internal Medicine

## 2012-08-19 NOTE — Telephone Encounter (Signed)
Pt needs 2 wk supply of levothyroxine 50 mcg call into [facility] [place]. Pt also needs new rxs sent to [facility] and atorvastatin 10 mg #90 and levothyroxine 50 mcg #90 with 3 refills each

## 2012-08-21 ENCOUNTER — Other Ambulatory Visit: Payer: Self-pay | Admitting: Family Medicine

## 2012-08-21 MED ORDER — ATORVASTATIN CALCIUM 10 MG PO TABS: 10.0000 mg | ORAL_TABLET | Freq: Every day | ORAL | Status: DC

## 2012-08-21 MED ORDER — LEVOTHYROXINE SODIUM 50 MCG PO TABS: 50.0000 ug | ORAL_TABLET | Freq: Every day | ORAL | Status: DC

## 2012-08-21 NOTE — Telephone Encounter (Signed)
Sent by [facility]. 

## 2012-12-16 ENCOUNTER — Other Ambulatory Visit: Payer: Self-pay | Admitting: Obstetrics and Gynecology

## 2012-12-16 DIAGNOSIS — R1031 Right lower quadrant pain: Secondary | ICD-10-CM

## 2012-12-18 ENCOUNTER — Ambulatory Visit
Admission: RE | Admit: 2012-12-18 | Discharge: 2012-12-18 | Disposition: A | Discharge status: Home / Self Care | Payer: BC Managed Care – PPO | Source: Ambulatory Visit | Attending: Obstetrics and Gynecology | Admitting: Obstetrics and Gynecology

## 2012-12-18 ENCOUNTER — Other Ambulatory Visit: Payer: BC Managed Care – PPO

## 2012-12-18 DIAGNOSIS — R1031 Right lower quadrant pain: Secondary | ICD-10-CM

## 2012-12-18 MED ORDER — IOHEXOL 300 MG/ML  SOLN
100.0000 mL | Freq: Once | INTRAMUSCULAR | Status: AC | PRN
  Administered 2012-12-18: 100 mL via INTRAVENOUS

## 2013-01-07 ENCOUNTER — Other Ambulatory Visit: Payer: Self-pay | Admitting: Family Medicine

## 2013-01-07 MED ORDER — ATORVASTATIN CALCIUM 10 MG PO TABS: 10.0000 mg | ORAL_TABLET | Freq: Every day | ORAL | Status: DC

## 2013-01-15 ENCOUNTER — Other Ambulatory Visit: Payer: Self-pay | Admitting: Family Medicine

## 2013-01-15 MED ORDER — LEVOTHYROXINE SODIUM 50 MCG PO TABS: 50.0000 ug | ORAL_TABLET | Freq: Every day | ORAL | Status: DC

## 2013-01-28 ENCOUNTER — Other Ambulatory Visit: Payer: Self-pay

## 2013-01-29 ENCOUNTER — Encounter: Payer: Self-pay | Admitting: Internal Medicine

## 2013-03-12 ENCOUNTER — Telehealth: Payer: Self-pay | Admitting: Family Medicine

## 2013-03-23 ENCOUNTER — Other Ambulatory Visit: Payer: Self-pay | Admitting: Family Medicine

## 2013-03-23 MED ORDER — ATORVASTATIN CALCIUM 10 MG PO TABS: 10.0000 mg | ORAL_TABLET | Freq: Every day | ORAL | Status: DC

## 2013-03-23 NOTE — Telephone Encounter (Signed)
Please notify the patient that medication was sent to the pharmacy for 6 months in Oct.  However, since it was not received I will resend for another 90 days.  Please have the patient check with pharmacy tomorrow to make sure the prescription was received and is being filled.  Sometimes it can take 24 hours before the prescription reaches it correct destination with 90 day supply companies.  This is why I say call them tomorrow.  Please have the patient call the office anytime there is a problem with her medication.  She should not wait so long or go without her medication.

## 2013-03-23 NOTE — Telephone Encounter (Signed)
Hi [name], pt called in stating that she received a letter from [facility] informing her that they were not able to get in contact with [place] to refill her atorvastatin (LIPITOR) 10 MG tablet. Pt only has a few pills left, pt is requesting refill. Please advise.

## 2013-03-25 DIAGNOSIS — R87810 Cervical high risk human papillomavirus (HPV) DNA test positive: Secondary | ICD-10-CM | POA: Insufficient documentation

## 2013-04-28 ENCOUNTER — Encounter: Payer: Self-pay | Admitting: Family Medicine

## 2013-04-28 ENCOUNTER — Telehealth: Payer: Self-pay | Admitting: Internal Medicine

## 2013-04-28 ENCOUNTER — Other Ambulatory Visit: Payer: Self-pay | Admitting: Family Medicine

## 2013-04-28 DIAGNOSIS — Z0000 Encounter for general adult medical examination without abnormal findings: Secondary | ICD-10-CM

## 2013-04-28 MED ORDER — LEVOTHYROXINE SODIUM 50 MCG PO TABS: 50.0000 ug | ORAL_TABLET | Freq: Every day | ORAL | Status: DC

## 2013-04-28 NOTE — Telephone Encounter (Signed)
#  90 sent to [facility] by [facility].

## 2013-04-28 NOTE — Telephone Encounter (Signed)
[facility] (MAIL ORDER) requesting new script for levothyroxine (SYNTHROID, LEVOTHROID) 50 MCG tablet

## 2013-07-29 ENCOUNTER — Other Ambulatory Visit: Payer: Self-pay | Admitting: Obstetrics and Gynecology

## 2013-07-29 DIAGNOSIS — Z1231 Encounter for screening mammogram for malignant neoplasm of breast: Secondary | ICD-10-CM

## 2013-08-02 ENCOUNTER — Telehealth: Payer: Self-pay | Admitting: Internal Medicine

## 2013-08-02 NOTE — Telephone Encounter (Signed)
[facility] (MAIL ORDER) [facility] - [place], [place] - [address] is requesting re-fill on levothyroxine (SYNTHROID, LEVOTHROID) 50 MCG tablet

## 2013-08-03 MED ORDER — LEVOTHYROXINE SODIUM 50 MCG PO TABS: 50.0000 ug | ORAL_TABLET | Freq: Every day | ORAL | Status: DC

## 2013-08-03 NOTE — Telephone Encounter (Signed)
Sent in one refill.  Pt has appt scheduled for 08/11/13.

## 2013-08-04 ENCOUNTER — Other Ambulatory Visit (INDEPENDENT_AMBULATORY_CARE_PROVIDER_SITE_OTHER): Payer: BC Managed Care – PPO

## 2013-08-04 DIAGNOSIS — Z0000 Encounter for general adult medical examination without abnormal findings: Secondary | ICD-10-CM

## 2013-08-04 LAB — BASIC METABOLIC PANEL
BUN: 18 mg/dL (ref 6–23)
CHLORIDE: 107 meq/L (ref 96–112)
CO2: 26 mEq/L (ref 19–32)
Calcium: 9 mg/dL (ref 8.4–10.5)
Creatinine, Ser: 0.8 mg/dL (ref 0.4–1.2)
GFR: 86.62 mL/min (ref >60.00)
GLUCOSE: 90 mg/dL (ref 70–99)
POTASSIUM: 4.8 meq/L (ref 3.5–5.1)
Sodium: 139 mEq/L (ref 135–145)

## 2013-08-04 LAB — CBC WITH DIFFERENTIAL/PLATELET
BASOS ABS: 0 10*3/uL (ref 0.0–0.1)
Basophils Relative: 0.3 % (ref 0.0–3.0)
EOS ABS: 0.1 10*3/uL (ref 0.0–0.7)
Eosinophils Relative: 1.8 % (ref 0.0–5.0)
HEMATOCRIT: 37.5 % (ref 36.0–46.0)
HEMOGLOBIN: 12.7 g/dL (ref 12.0–15.0)
LYMPHS ABS: 1.7 10*3/uL (ref 0.7–4.0)
Lymphocytes Relative: 21 % (ref 12.0–46.0)
MCHC: 34 g/dL (ref 30.0–36.0)
MCV: 94.1 fl (ref 78.0–100.0)
Monocytes Absolute: 0.5 10*3/uL (ref 0.1–1.0)
Monocytes Relative: 6.6 % (ref 3.0–12.0)
NEUTROS ABS: 5.8 10*3/uL (ref 1.4–7.7)
Neutrophils Relative %: 70.3 % (ref 43.0–77.0)
Platelets: 275 10*3/uL (ref 150.0–400.0)
RBC: 3.98 Mil/uL (ref 3.87–5.11)
RDW: 12.3 % (ref 11.5–15.5)
WBC: 8.3 10*3/uL (ref 4.0–10.5)

## 2013-08-04 LAB — LIPID PANEL
CHOLESTEROL: 147 mg/dL (ref 0–200)
HDL: 53.2 mg/dL (ref >39.00)
LDL Cholesterol: 86 mg/dL (ref 0–99)
Total CHOL/HDL Ratio: 3
Triglycerides: 40 mg/dL (ref 0.0–149.0)
VLDL: 8 mg/dL (ref 0.0–40.0)

## 2013-08-04 LAB — HEPATIC FUNCTION PANEL
ALBUMIN: 3.9 g/dL (ref 3.5–5.2)
ALT: 35 U/L (ref 0–35)
AST: 26 U/L (ref 0–37)
Alkaline Phosphatase: 52 U/L (ref 39–117)
Bilirubin, Direct: 0.1 mg/dL (ref 0.0–0.3)
Total Bilirubin: 0.8 mg/dL (ref 0.2–1.2)
Total Protein: 7 g/dL (ref 6.0–8.3)

## 2013-08-04 LAB — TSH: TSH: 1.72 u[IU]/mL (ref 0.35–4.50)

## 2013-08-06 ENCOUNTER — Ambulatory Visit (HOSPITAL_COMMUNITY)
Admission: RE | Admit: 2013-08-06 | Discharge: 2013-08-06 | Disposition: A | Discharge status: Home / Self Care | Payer: BC Managed Care – PPO | Source: Ambulatory Visit | Attending: Obstetrics and Gynecology | Admitting: Obstetrics and Gynecology

## 2013-08-06 DIAGNOSIS — Z1231 Encounter for screening mammogram for malignant neoplasm of breast: Secondary | ICD-10-CM | POA: Insufficient documentation

## 2013-08-11 ENCOUNTER — Encounter: Payer: Self-pay | Admitting: Internal Medicine

## 2013-08-11 ENCOUNTER — Ambulatory Visit (INDEPENDENT_AMBULATORY_CARE_PROVIDER_SITE_OTHER): Payer: BC Managed Care – PPO | Admitting: Internal Medicine

## 2013-08-11 VITALS — BP 96/68 | HR 64 | Temp 98.2°F | Ht 62.0 in | Wt 142.0 lb

## 2013-08-11 DIAGNOSIS — R1310 Dysphagia, unspecified: Secondary | ICD-10-CM

## 2013-08-11 DIAGNOSIS — E785 Hyperlipidemia, unspecified: Secondary | ICD-10-CM

## 2013-08-11 DIAGNOSIS — R6889 Other general symptoms and signs: Secondary | ICD-10-CM

## 2013-08-11 DIAGNOSIS — E039 Hypothyroidism, unspecified: Secondary | ICD-10-CM

## 2013-08-11 DIAGNOSIS — Z0000 Encounter for general adult medical examination without abnormal findings: Secondary | ICD-10-CM

## 2013-08-11 NOTE — Progress Notes (Signed)
Chief Complaint  Patient presents with  . Annual Exam    HPI: Patient comes in today for Preventive Health Care visit  No major changes in health injuries  Hs about 6 moths of feeling like something caught in her throat difficult to swallow solids more than liquids but no choking stridor shortness of breath significant heartburn or chest symptoms. Does like something stuck in her throat but hasn't choked on anything sometime difficult to swallow pills. No history of same family history of throat cancer father LIPIDS: No side effects of medicine THYroid taking meds regularly no concern. Has braces LIFESTYLE:  Exercise:  Runs  No limitiation Tobacco/ETS: Alcohol: per day  Sugar beverages:avoids  Sleep:good Drug use: no   Health Maintenance  Topic Date Due  . Influenza Vaccine  10/23/2013  . Pap Smear  06/06/2015  . Tetanus/tdap  02/26/2021   Health Maintenance Review   ROS:  GEN/ HEENT: No fever, significant weight changes sweats headaches vision problems hearing changes, CV/ PULM; No chest pain shortness of breath cough, syncope,edema  change in exercise tolerance. GI /GU: No adominal pain, vomiting, change in bowel habits. No blood in the stool. No significant GU symptoms. SKIN/HEME: ,no acute skin rashes suspicious lesions or bleeding. No lymphadenopathy, nodules, masses.  NEURO/ PSYCH:  No neurologic signs such as weakness numbness. No depression anxiety. Hands hurt after housework  IMM/ Allergy: No unusual infections.  Allergy .   REST of 12 system review negative except as per HPI   Past Medical History  Diagnosis Date  . Hypothyroidism   . Panic attacks     on meds pre pregnancy/hx  . Pancreatitis     2000 and 2005  . HELLP (hemolytic anemia/elev liver enzymes/low platelets in pregnancy)     c-section 2007  . Hyperlipidemia    Past Surgical History  Procedure Laterality Date  . Appendectomy  2001  . Cesarean section  2007    Family History  Problem  Relation Age of Onset  . Heart attack Father     in 6650s   . Hyperlipidemia Father   . Diabetes      gm and gf  . Hypertension Father     gm and gf  . Arthritis      gp ? OA  . Hyperlipidemia Mother   . Colon cancer Neg Hx   . Stomach cancer Neg Hx   . CVA Father     arrynthmia ? af  age 44   . Throat cancer Father     History   Social History  . Marital Status: Married    Spouse Name: N/A    Number of Children: N/A  . Years of Education: N/A   Social History Main Topics  . Smoking status: Never Smoker   . Smokeless tobacco: Never Used  . Alcohol Use: No  . Drug Use: No  . Sexual Activity: None   Other Topics Concern  . None   Social History Narrative   HH of 3 pet  Dog     Young child at home   No ets   No caffeine   Drink milk 2-3 x per day    reg exercise    [place]             Outpatient Encounter Prescriptions as of 08/11/2013  Medication Sig  . atorvastatin (LIPITOR) 10 MG tablet Take 1 tablet (10 mg total) by mouth daily.  Marland Kitchen. levothyroxine (SYNTHROID, LEVOTHROID) 50 MCG tablet  Take 1 tablet (50 mcg total) by mouth daily.    EXAM:  BP 96/68  Pulse 64  Temp(Src) 98.2 F (36.8 C) (Oral)  Ht 5\' 2"  (1.575 m)  Wt 142 lb (64.411 kg)  BMI 25.97 kg/m2  SpO2 98%  LMP 08/05/2013  Body mass index is 25.97 kg/(m^2).  Physical Exam: Vital signs reviewed ZOX:WRUEGEN:This is a well-developed well-nourished alert cooperative    who appearsr stated age in no acute distress.  HEENT: normocephalic atraumatic , Eyes: PERRL EOM's full, conjunctiva clear, Nares: paten,t no deformity discharge or tenderness., Ears: no deformity EAC's clear TMs with normal landmarks. Mouth: clear OP, no lesions, edema.  Moist mucous membranes. Dentition in adequate repair. NECK: supple without masses, thyromegaly or bruits. CHEST/PULM:  Clear to auscultation and percussion breath sounds equal no wheeze , rales or rhonchi. No chest wall deformities or tenderness. CV: PMI is  nondisplaced, S1 S2 no gallops, murmurs, rubs. Peripheral pulses are full without delay.No JVD .  ABDOMEN: Bowel sounds normal nontender  No guard or rebound, no hepato splenomegal no CVA tenderness.  No hernia. Extremtities:  No clubbing cyanosis or edema, no acute joint swelling or redness no focal atrophy NEURO:  Oriented x3, cranial nerves 3-12 appear to be intact, no obvious focal weakness,gait within normal limits no abnormal reflexes or asymmetrical SKIN: No acute rashes normal turgor, color, no bruising or petechiae. PSYCH: Oriented, good eye contact, no obvious depression anxiety, cognition and judgment appear normal. LN: no cervical axillary inguinal adenopathy  Lab Results  Component Value Date   WBC 8.3 08/04/2013   HGB 12.7 08/04/2013   HCT 37.5 08/04/2013   PLT 275.0 08/04/2013   GLUCOSE 90 08/04/2013   CHOL 147 08/04/2013   TRIG 40.0 08/04/2013   HDL 53.20 08/04/2013   LDLDIRECT 173.7 02/20/2011   LDLCALC 86 08/04/2013   ALT 35 08/04/2013   AST 26 08/04/2013   NA 139 08/04/2013   K 4.8 08/04/2013   CL 107 08/04/2013   CREATININE 0.8 08/04/2013   BUN 18 08/04/2013   CO2 26 08/04/2013   TSH 1.72 08/04/2013    ASSESSMENT AND PLAN:  Discussed the following assessment and plan:  Visit for preventive health examination  HYPERLIPIDEMIA  HYPOTHYROIDISM  Swallowing problem - feeling of something in throat for about 6 month with swalpowing and not solids more than liquids  get ent to see do not think this a globus or anxiety sx  - Plan: Ambulatory referral to ENT  Throat symptom - Plan: Ambulatory referral to ENT Plan fu if the sx are nor resolved or unexplained for further eval. Patient Care Team: Madelin HeadingsWanda K Jerlyn Pain, MD as PCP - General Hal MoralesVanessa P Haygood, MD (Obstetrics and Gynecology) Patient Instructions  Continue lifestyle intervention healthy eating and exercise . You will be contacted about ENT appt to get a good throat exam  In regard to the feeling of somethin g in throat  when swallowing. If  persistent or progressive we may have more evaluation if needed.   Yearly visit and labs otherwise .    Neta MendsWanda K. Savaughn Karwowski M.D.    Pre visit review using our clinic review tool, if applicable. No additional management support is needed unless otherwise documented below in the visit note.

## 2013-08-11 NOTE — Patient Instructions (Addendum)
Continue lifestyle intervention healthy eating and exercise . You will be contacted about ENT appt to get a good throat exam  In regard to the feeling of somethin g in throat when swallowing. If  persistent or progressive we may have more evaluation if needed.   Yearly visit and labs otherwise .

## 2013-09-24 ENCOUNTER — Other Ambulatory Visit: Payer: Self-pay | Admitting: Internal Medicine

## 2013-09-27 NOTE — Telephone Encounter (Signed)
Sent to the pharmacy by e-scribe. 

## 2013-11-17 ENCOUNTER — Other Ambulatory Visit: Payer: Self-pay | Admitting: Family Medicine

## 2013-11-17 MED ORDER — LEVOTHYROXINE SODIUM 50 MCG PO TABS: 50.0000 ug | ORAL_TABLET | Freq: Every day | ORAL | Status: DC

## 2013-11-17 NOTE — Telephone Encounter (Signed)
Received a fax from [facility] requesting refills of levothyroxine (SYNTHROID, LEVOTHROID) 50 MCG tablet.  Pt had her lab work and CPE in May 2015.  Will fill until May 2016.

## 2014-05-27 ENCOUNTER — Ambulatory Visit (INDEPENDENT_AMBULATORY_CARE_PROVIDER_SITE_OTHER): Payer: 59 | Admitting: Internal Medicine

## 2014-05-27 ENCOUNTER — Encounter: Payer: Self-pay | Admitting: Internal Medicine

## 2014-05-27 VITALS — BP 104/72 | Temp 98.5°F | Ht 62.0 in | Wt 149.0 lb

## 2014-05-27 DIAGNOSIS — Z658 Other specified problems related to psychosocial circumstances: Secondary | ICD-10-CM

## 2014-05-27 DIAGNOSIS — E039 Hypothyroidism, unspecified: Secondary | ICD-10-CM

## 2014-05-27 DIAGNOSIS — R5383 Other fatigue: Secondary | ICD-10-CM

## 2014-05-27 DIAGNOSIS — F439 Reaction to severe stress, unspecified: Secondary | ICD-10-CM

## 2014-05-27 LAB — CBC WITH DIFFERENTIAL/PLATELET
BASOS ABS: 0 10*3/uL (ref 0.0–0.1)
Basophils Relative: 0.4 % (ref 0.0–3.0)
Eosinophils Absolute: 0.1 10*3/uL (ref 0.0–0.7)
Eosinophils Relative: 1.6 % (ref 0.0–5.0)
HEMATOCRIT: 36.9 % (ref 36.0–46.0)
Hemoglobin: 12.5 g/dL (ref 12.0–15.0)
Lymphocytes Relative: 26.1 % (ref 12.0–46.0)
Lymphs Abs: 2.3 10*3/uL (ref 0.7–4.0)
MCHC: 33.8 g/dL (ref 30.0–36.0)
MCV: 90.9 fl (ref 78.0–100.0)
MONOS PCT: 6.9 % (ref 3.0–12.0)
Monocytes Absolute: 0.6 10*3/uL (ref 0.1–1.0)
NEUTROS ABS: 5.7 10*3/uL (ref 1.4–7.7)
NEUTROS PCT: 65 % (ref 43.0–77.0)
PLATELETS: 329 10*3/uL (ref 150.0–400.0)
RBC: 4.06 Mil/uL (ref 3.87–5.11)
RDW: 12.2 % (ref 11.5–15.5)
WBC: 8.8 10*3/uL (ref 4.0–10.5)

## 2014-05-27 LAB — T4, FREE: FREE T4: 0.86 ng/dL (ref 0.60–1.60)

## 2014-05-27 LAB — TSH: TSH: 1.47 u[IU]/mL (ref 0.35–4.50)

## 2014-05-27 NOTE — Progress Notes (Signed)
Pre visit review using our clinic review tool, if applicable. No additional management support is needed unless otherwise documented below in the visit note.   Chief Complaint  Patient presents with  . Feels tired and cold    Not sure if her medication is working.  Could this be a panic attack?    HPI:  Patient Karen Fleming  comes in today for SDA for  new problem evaluation.  concern about a number os sx   Co of feeling of tense muscles Tired sleep  And cold  No  Missed doses takes in am  This am shaking  Hands  And feel disoriented   In stressed  [place] .  Exams coming up. 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 If had panic attack .this am  Other healthhx  update Dr Jerral Ralph her  linsess  For 5 months . HOurs of sleep : 6-8   Takes a nap in am . Bed 9 pm  To 6 am  Still tired  Takes thyroid med in am with her linzess No prob with atorva hasnt been exercising recently  Has had some caffiene to study but not reg ROS: See pertinent positives and negatives per HPI. denies risk of pregnancy periods nl  Past Medical History  Diagnosis Date  . Hypothyroidism   . Panic attacks     on meds pre pregnancy/hx  . Pancreatitis     2000 and 2005  . HELLP (hemolytic anemia/elev liver enzymes/low platelets in pregnancy)     c-section 2007  . Hyperlipidemia     Family History  Problem Relation Age of Onset  . Heart attack Father     in 4s   . Hyperlipidemia Father   . Diabetes      gm and gf  . Hypertension Father     gm and gf  . Arthritis      gp ? OA  . Hyperlipidemia Mother   . Colon cancer Neg Hx   . Stomach cancer Neg Hx   . CVA Father     arrynthmia ? af  age 42   . Throat cancer Father     History   Social History  . Marital Status: Married    Spouse Name: N/A  . Number of Children: N/A  . Years of Education: N/A   Social History Main Topics  . Smoking status: Never Smoker   . Smokeless tobacco: Never Used  . Alcohol Use:  No  . Drug Use: No  . Sexual Activity: Not on file   Other Topics Concern  . None   Social History Narrative   HH of 3 pet  Dog     Young child at home   No ets   No caffeine   Drink milk 2-3 x per day    reg exercise    [place]             Outpatient Encounter Prescriptions as of 05/27/2014  Medication Sig  . atorvastatin (LIPITOR) 10 MG tablet TAKE 1 BY MOUTH DAILY  . levothyroxine (SYNTHROID, LEVOTHROID) 50 MCG tablet Take 1 tablet (50 mcg total) by mouth daily.  Marland Kitchen LINZESS 290 MCG CAPS capsule   . [DISCONTINUED] atorvastatin (LIPITOR) 10 MG tablet Take 1 tablet (10 mg total) by mouth daily.    EXAM:  BP 104/72 mmHg  Temp(Src) 98.5 F (36.9 C) (Oral)  Ht  (1.575 m)  Wt  149 lb (67.586 kg)  BMI 27.25 kg/m2  Body mass index is 27.25 kg/(m^2).  GENERAL: vitals reviewed and listed above, alert, oriented, appears well hydrated and in no acute distress HEENT: atraumatic, conjunctiva  clear, no obvious abnormalities on inspection of external nose and ears OP : no lesion edema or exudate  Braces  NECK: no obvious masses on inspection palpation  thyrod palpable  No nodules  LUNGS: clear to auscultation bilaterally, no wheezes, rales or rhonchi, good air movement CV: HRRR, no clubbing cyanosis or  peripheral edema nl cap refill  Abdomen:  Sof,t normal bowel sounds without hepatosplenomegaly, no guarding rebound or masses no CVA tenderness MS: moves all extremities without noticeable focal  abnormality PSYCH: pleasant and cooperative, no obvious depression or anxiety no tremor and neuro grossly non focal   ASSESSMENT AND PLAN:  Discussed the following assessment and plan:  Hypothyroidism, unspecified hypothyroidism type - Plan: TSH, T4, free, CBC with Differential/Platelet  Stress - Plan: TSH, T4, free, CBC with Differential/Platelet  Other fatigue - Plan: TSH, T4, free, CBC with Differential/Platelet Sx could be from recently stresses and lack of exercise  .  Remote hx of ssri and anxiety related to   An illness  But has been doing well since then  Take thyroid med without other meds etc and check labs today  -Patient advised to return or notify health care team  if symptoms worsen ,persist or new concerns arise.  Patient Instructions  Will notify you  of labs when available. Checking for thyroid  Off and anemia. Take the thyroid med  Empty stomach with water  And at least 1 hour before  Any food o other meds.  Most of these symptoms could bet stress  . May want to add  Exercise for stress reduction. Limit caffiene and alcohol . If ongoing we can add medication   Stress Stress-related medical problems are becoming increasingly common. The body has a built-in physical response to stressful situations. Faced with pressure, challenge or danger, we need to react quickly. Our bodies release hormones such as cortisol and adrenaline to help do this. These hormones are part of the "fight or flight" response and affect the metabolic rate, heart rate and blood pressure, resulting in a heightened, stressed state that prepares the body for optimum performance in dealing with a stressful situation. It is likely that early man required these mechanisms to stay alive, but usually modern stresses do not call for this, and the same hormones released in today's world can damage health and reduce coping ability. CAUSES  Pressure to perform at work, at school or in sports.  Threats of physical violence.  Money worries.  Arguments.  Family conflicts.  Divorce or separation from significant other.  Bereavement.  New job or unemployment.  Changes in location.  Alcohol or drug abuse. SOMETIMES, THERE IS NO PARTICULAR REASON FOR DEVELOPING STRESS. Almost all people are at risk of being stressed at some time in their lives. It is important to know that some stress is temporary and some is long term.  Temporary stress will go away when a situation is  resolved. Most people can cope with short periods of stress, and it can often be relieved by relaxing, taking a walk or getting any type of exercise, chatting through issues with friends, or having a good night's sleep.  Chronic (long-term, continuous) stress is much harder to deal with. It can be psychologically and emotionally damaging. It can be harmful both for an individual  and for friends and family. SYMPTOMS Everyone reacts to stress differently. There are some common effects that help [place] recognize it. In times of extreme stress, people may:  Shake uncontrollably.  Breathe faster and deeper than normal (hyperventilate).  Vomit.  For people with asthma, stress can trigger an attack.  For some people, stress may trigger migraine headaches, ulcers, and body pain. PHYSICAL EFFECTS OF STRESS MAY INCLUDE:  Loss of energy.  Skin problems.  Aches and pains resulting from tense muscles, including neck ache, backache and tension headaches.  Increased pain from arthritis and other conditions.  Irregular heart beat (palpitations).  Periods of irritability or anger.  Apathy or depression.  Anxiety (feeling uptight or worrying).  Unusual behavior.  Loss of appetite.  Comfort eating.  Lack of concentration.  Loss of, or decreased, sex-drive.  Increased smoking, drinking, or recreational drug use.  For women, missed periods.  Ulcers, joint pain, and muscle pain. Post-traumatic stress is the stress caused by any serious accident, strong emotional damage, or extremely difficult or violent experience such as rape or war. Post-traumatic stress victims can experience mixtures of emotions such as fear, shame, depression, guilt or anger. It may include recurrent memories or images that may be haunting. These feelings can last for weeks, months or even years after the traumatic event that triggered them. Specialized treatment, possibly with medicines and psychological therapies, is  available. If stress is causing physical symptoms, severe distress or making it difficult for you to function as normal, it is worth seeing your caregiver. It is important to remember that although stress is a usual part of life, extreme or prolonged stress can lead to other illnesses that will need treatment. It is better to visit a doctor sooner rather than later. Stress has been linked to the development of high blood pressure and heart disease, as well as insomnia and depression. There is no diagnostic test for stress since everyone reacts to it differently. But a caregiver will be able to spot the physical symptoms, such as:  Headaches.  Shingles.  Ulcers. Emotional distress such as intense worry, low mood or irritability should be detected when the doctor asks pertinent questions to identify any underlying problems that might be the cause. In case there are physical reasons for the symptoms, the doctor may also want to do some tests to exclude certain conditions. If you feel that you are suffering from stress, try to identify the aspects of your life that are causing it. Sometimes you may not be able to change or avoid them, but even a small change can have a positive ripple effect. A simple lifestyle change can make all the difference. STRATEGIES THAT CAN HELP DEAL WITH STRESS:  Delegating or sharing responsibilities.  Avoiding confrontations.  Learning to be more assertive.  Regular exercise.  Avoid using alcohol or street drugs to cope.  Eating a healthy, balanced diet, rich in fruit and vegetables and proteins.  Finding humor or absurdity in stressful situations.  Never taking on more than you know you can handle comfortably.  Organizing your time better to get as much done as possible.  Talking to friends or family and sharing your thoughts and fears.  Listening to music or relaxation tapes.  Relaxation techniques like deep breathing, meditation, and yoga.  Tensing and  then relaxing your muscles, starting at the toes and working up to the head and neck. If you think that you would benefit from help, either in identifying the things that are causing  your stress or in learning techniques to help you relax, see a caregiver who is capable of helping you with this. Rather than relying on medications, it is usually better to try and identify the things in your life that are causing stress and try to deal with them. There are many techniques of managing stress including counseling, psychotherapy, aromatherapy, yoga, and exercise. Your caregiver can help you determine what is best for you. Document Released: 06/01/2002 Document Revised: 03/16/2013 Document Reviewed: 04/28/2007 [facility] Patient Information 2015 [place] [place]. This information is not intended to replace advice given to you by your health care provider. Make sure you discuss any questions you have with your health care provider.      Neta MendsWanda K. Panosh M.D.

## 2014-05-27 NOTE — Patient Instructions (Signed)
Will notify you  of labs when available. Checking for thyroid  Off and anemia. Take the thyroid med  Empty stomach with water  And at least 1 hour before  Any food o other meds.  Most of these symptoms could bet stress  . May want to add  Exercise for stress reduction. Limit caffiene and alcohol . If ongoing we can add medication   Stress Stress-related medical problems are becoming increasingly common. The body has a built-in physical response to stressful situations. Faced with pressure, challenge or danger, we need to react quickly. Our bodies release hormones such as cortisol and adrenaline to help do this. These hormones are part of the "fight or flight" response and affect the metabolic rate, heart rate and blood pressure, resulting in a heightened, stressed state that prepares the body for optimum performance in dealing with a stressful situation. It is likely that early man required these mechanisms to stay alive, but usually modern stresses do not call for this, and the same hormones released in today's world can damage health and reduce coping ability. CAUSES  Pressure to perform at work, at school or in sports.  Threats of physical violence.  Money worries.  Arguments.  Family conflicts.  Divorce or separation from significant other.  Bereavement.  New job or unemployment.  Changes in location.  Alcohol or drug abuse. SOMETIMES, THERE IS NO PARTICULAR REASON FOR DEVELOPING STRESS. Almost all people are at risk of being stressed at some time in their lives. It is important to know that some stress is temporary and some is long term.  Temporary stress will go away when a situation is resolved. Most people can cope with short periods of stress, and it can often be relieved by relaxing, taking a walk or getting any type of exercise, chatting through issues with friends, or having a good night's sleep.  Chronic (long-term, continuous) stress is much harder to deal with. It  can be psychologically and emotionally damaging. It can be harmful both for an individual and for friends and family. SYMPTOMS Everyone reacts to stress differently. There are some common effects that help [place] recognize it. In times of extreme stress, people may:  Shake uncontrollably.  Breathe faster and deeper than normal (hyperventilate).  Vomit.  For people with asthma, stress can trigger an attack.  For some people, stress may trigger migraine headaches, ulcers, and body pain. PHYSICAL EFFECTS OF STRESS MAY INCLUDE:  Loss of energy.  Skin problems.  Aches and pains resulting from tense muscles, including neck ache, backache and tension headaches.  Increased pain from arthritis and other conditions.  Irregular heart beat (palpitations).  Periods of irritability or anger.  Apathy or depression.  Anxiety (feeling uptight or worrying).  Unusual behavior.  Loss of appetite.  Comfort eating.  Lack of concentration.  Loss of, or decreased, sex-drive.  Increased smoking, drinking, or recreational drug use.  For women, missed periods.  Ulcers, joint pain, and muscle pain. Post-traumatic stress is the stress caused by any serious accident, strong emotional damage, or extremely difficult or violent experience such as rape or war. Post-traumatic stress victims can experience mixtures of emotions such as fear, shame, depression, guilt or anger. It may include recurrent memories or images that may be haunting. These feelings can last for weeks, months or even years after the traumatic event that triggered them. Specialized treatment, possibly with medicines and psychological therapies, is available. If stress is causing physical symptoms, severe distress or making it difficult for  you to function as normal, it is worth seeing your caregiver. It is important to remember that although stress is a usual part of life, extreme or prolonged stress can lead to other illnesses that will  need treatment. It is better to visit a doctor sooner rather than later. Stress has been linked to the development of high blood pressure and heart disease, as well as insomnia and depression. There is no diagnostic test for stress since everyone reacts to it differently. But a caregiver will be able to spot the physical symptoms, such as:  Headaches.  Shingles.  Ulcers. Emotional distress such as intense worry, low mood or irritability should be detected when the doctor asks pertinent questions to identify any underlying problems that might be the cause. In case there are physical reasons for the symptoms, the doctor may also want to do some tests to exclude certain conditions. If you feel that you are suffering from stress, try to identify the aspects of your life that are causing it. Sometimes you may not be able to change or avoid them, but even a small change can have a positive ripple effect. A simple lifestyle change can make all the difference. STRATEGIES THAT CAN HELP DEAL WITH STRESS:  Delegating or sharing responsibilities.  Avoiding confrontations.  Learning to be more assertive.  Regular exercise.  Avoid using alcohol or street drugs to cope.  Eating a healthy, balanced diet, rich in fruit and vegetables and proteins.  Finding humor or absurdity in stressful situations.  Never taking on more than you know you can handle comfortably.  Organizing your time better to get as much done as possible.  Talking to friends or family and sharing your thoughts and fears.  Listening to music or relaxation tapes.  Relaxation techniques like deep breathing, meditation, and yoga.  Tensing and then relaxing your muscles, starting at the toes and working up to the head and neck. If you think that you would benefit from help, either in identifying the things that are causing your stress or in learning techniques to help you relax, see a caregiver who is capable of helping you with  this. Rather than relying on medications, it is usually better to try and identify the things in your life that are causing stress and try to deal with them. There are many techniques of managing stress including counseling, psychotherapy, aromatherapy, yoga, and exercise. Your caregiver can help you determine what is best for you. Document Released: 06/01/2002 Document Revised: 03/16/2013 Document Reviewed: 04/28/2007 [facility] Patient Information 2015 [place], [place]. This information is not intended to replace advice given to you by your health care provider. Make sure you discuss any questions you have with your health care provider.

## 2014-05-29 DIAGNOSIS — R5383 Other fatigue: Secondary | ICD-10-CM | POA: Insufficient documentation

## 2014-05-29 DIAGNOSIS — F439 Reaction to severe stress, unspecified: Secondary | ICD-10-CM | POA: Insufficient documentation

## 2014-05-31 ENCOUNTER — Encounter: Payer: Self-pay | Admitting: Family Medicine

## 2014-07-15 ENCOUNTER — Other Ambulatory Visit (HOSPITAL_COMMUNITY): Payer: Self-pay | Admitting: Obstetrics and Gynecology

## 2014-07-15 ENCOUNTER — Telehealth: Payer: Self-pay | Admitting: Internal Medicine

## 2014-07-15 DIAGNOSIS — Z1231 Encounter for screening mammogram for malignant neoplasm of breast: Secondary | ICD-10-CM

## 2014-07-15 NOTE — Telephone Encounter (Signed)
Pt request refill levothyroxine (SYNTHROID, LEVOTHROID) 50 MCG tablet New pharm   [facility]/ [address]   90 day

## 2014-07-18 MED ORDER — LEVOTHYROXINE SODIUM 50 MCG PO TABS: 50.0000 ug | ORAL_TABLET | Freq: Every day | ORAL | Status: DC

## 2014-07-18 NOTE — Telephone Encounter (Signed)
Sent to the pharmacy by e-scribe.  Pt is now due for CPX.  Please help the pt to get on the schedule.  Please make lab appt ahead of time.  Send back to me and I will enter labs. Thanks!

## 2014-07-21 NOTE — Telephone Encounter (Signed)
Lm on vm to cb °

## 2014-07-27 NOTE — Telephone Encounter (Signed)
Pt has been scheduled fpr cpe.  Can you pit the labs in?

## 2014-07-28 NOTE — Telephone Encounter (Signed)
Repeat it anyway with the cpx panel  thanks

## 2014-07-28 NOTE — Telephone Encounter (Signed)
Should pt get TSH?  Performed in March this year.

## 2014-07-29 ENCOUNTER — Other Ambulatory Visit: Payer: Self-pay | Admitting: Family Medicine

## 2014-07-29 DIAGNOSIS — Z0000 Encounter for general adult medical examination without abnormal findings: Secondary | ICD-10-CM

## 2014-07-29 NOTE — Telephone Encounter (Signed)
Orders placed in the system.

## 2014-08-08 ENCOUNTER — Other Ambulatory Visit (HOSPITAL_COMMUNITY): Payer: Self-pay | Admitting: Obstetrics and Gynecology

## 2014-08-08 ENCOUNTER — Ambulatory Visit (HOSPITAL_COMMUNITY)
Admission: RE | Admit: 2014-08-08 | Discharge: 2014-08-08 | Disposition: A | Discharge status: Home / Self Care | Payer: 59 | Source: Ambulatory Visit | Attending: Obstetrics and Gynecology | Admitting: Obstetrics and Gynecology

## 2014-08-08 DIAGNOSIS — Z1231 Encounter for screening mammogram for malignant neoplasm of breast: Secondary | ICD-10-CM

## 2014-08-23 ENCOUNTER — Other Ambulatory Visit (INDEPENDENT_AMBULATORY_CARE_PROVIDER_SITE_OTHER): Payer: 59

## 2014-08-23 DIAGNOSIS — Z0000 Encounter for general adult medical examination without abnormal findings: Secondary | ICD-10-CM | POA: Diagnosis not present

## 2014-08-23 LAB — BASIC METABOLIC PANEL
BUN: 14 mg/dL (ref 6–23)
CALCIUM: 9.2 mg/dL (ref 8.4–10.5)
CHLORIDE: 105 meq/L (ref 96–112)
CO2: 25 mEq/L (ref 19–32)
CREATININE: 0.71 mg/dL (ref 0.40–1.20)
GFR: 94.66 mL/min (ref >60.00)
GLUCOSE: 87 mg/dL (ref 70–99)
Potassium: 3.6 mEq/L (ref 3.5–5.1)
SODIUM: 139 meq/L (ref 135–145)

## 2014-08-23 LAB — HEPATIC FUNCTION PANEL
ALK PHOS: 81 U/L (ref 39–117)
ALT: 15 U/L (ref 0–35)
AST: 20 U/L (ref 0–37)
Albumin: 4.4 g/dL (ref 3.5–5.2)
BILIRUBIN DIRECT: 0.2 mg/dL (ref 0.0–0.3)
Total Bilirubin: 1.4 mg/dL — ABNORMAL HIGH (ref 0.2–1.2)
Total Protein: 7.8 g/dL (ref 6.0–8.3)

## 2014-08-23 LAB — LIPID PANEL
CHOL/HDL RATIO: 3
CHOLESTEROL: 165 mg/dL (ref 0–200)
HDL: 55.4 mg/dL (ref >39.00)
LDL Cholesterol: 92 mg/dL (ref 0–99)
NonHDL: 109.6
Triglycerides: 89 mg/dL (ref 0.0–149.0)
VLDL: 17.8 mg/dL (ref 0.0–40.0)

## 2014-08-23 LAB — CBC WITH DIFFERENTIAL/PLATELET
Basophils Absolute: 0 10*3/uL (ref 0.0–0.1)
Basophils Relative: 0.5 % (ref 0.0–3.0)
EOS PCT: 2.5 % (ref 0.0–5.0)
Eosinophils Absolute: 0.2 10*3/uL (ref 0.0–0.7)
HEMATOCRIT: 39.5 % (ref 36.0–46.0)
HEMOGLOBIN: 13.4 g/dL (ref 12.0–15.0)
Lymphocytes Relative: 18.4 % (ref 12.0–46.0)
Lymphs Abs: 1.7 10*3/uL (ref 0.7–4.0)
MCHC: 34.1 g/dL (ref 30.0–36.0)
MCV: 91.8 fl (ref 78.0–100.0)
MONOS PCT: 7.5 % (ref 3.0–12.0)
Monocytes Absolute: 0.7 10*3/uL (ref 0.1–1.0)
NEUTROS ABS: 6.5 10*3/uL (ref 1.4–7.7)
Neutrophils Relative %: 71.1 % (ref 43.0–77.0)
Platelets: 321 10*3/uL (ref 150.0–400.0)
RBC: 4.3 Mil/uL (ref 3.87–5.11)
RDW: 12 % (ref 11.5–15.5)
WBC: 9.1 10*3/uL (ref 4.0–10.5)

## 2014-08-23 LAB — TSH: TSH: 1.13 u[IU]/mL (ref 0.35–4.50)

## 2014-08-30 ENCOUNTER — Encounter: Payer: 59 | Admitting: Internal Medicine

## 2014-08-30 ENCOUNTER — Telehealth: Payer: Self-pay | Admitting: Internal Medicine

## 2014-08-30 DIAGNOSIS — Z0289 Encounter for other administrative examinations: Secondary | ICD-10-CM

## 2014-08-30 NOTE — Telephone Encounter (Signed)
Pt miss her appt today and called to say she need to see Dr Fabian SharpPanosh about medicine for her panic attacks .Karen Fleming.  Do I need to reschedule her physical or give her a minute appt

## 2014-08-30 NOTE — Telephone Encounter (Signed)
How about 915 on tues June 14th  Next week  30 minutes   Wellness  And or  OV

## 2014-08-30 NOTE — Progress Notes (Signed)
Document opened and reviewed for OVcpx  but appt  NS same day .  thoght appt was next day  reshceduled

## 2014-08-30 NOTE — Telephone Encounter (Signed)
Patient called stating she has her CPX written on her calendar on 08/31/14.  She would like to r/s, where can I work patient in for the appointment?

## 2014-08-30 NOTE — Telephone Encounter (Signed)
S/w pt and she was very adamant about getting medicine for her panic attacks. She said she need something now. She said she will schedule her physical but she can not go on without some medicine . Can I create something something for her.

## 2014-08-30 NOTE — Telephone Encounter (Signed)
She needs to schedule a physical

## 2014-08-31 MED ORDER — ESCITALOPRAM OXALATE 10 MG PO TABS: ORAL_TABLET | ORAL | Status: DC

## 2014-08-31 NOTE — Telephone Encounter (Signed)
Scheduled pt and lmovm with appt time and date

## 2014-08-31 NOTE — Telephone Encounter (Signed)
[name]

## 2014-08-31 NOTE — Telephone Encounter (Signed)
Pt notified to pick up rx at the pharmacy. 

## 2014-09-06 ENCOUNTER — Encounter: Payer: Self-pay | Admitting: Internal Medicine

## 2014-09-06 ENCOUNTER — Ambulatory Visit (INDEPENDENT_AMBULATORY_CARE_PROVIDER_SITE_OTHER): Payer: 59 | Admitting: Internal Medicine

## 2014-09-06 VITALS — BP 116/72 | Temp 98.4°F | Ht 61.5 in | Wt 146.9 lb

## 2014-09-06 DIAGNOSIS — F410 Panic disorder [episodic paroxysmal anxiety] without agoraphobia: Secondary | ICD-10-CM

## 2014-09-06 DIAGNOSIS — Z0000 Encounter for general adult medical examination without abnormal findings: Secondary | ICD-10-CM

## 2014-09-06 DIAGNOSIS — Z658 Other specified problems related to psychosocial circumstances: Secondary | ICD-10-CM

## 2014-09-06 DIAGNOSIS — E785 Hyperlipidemia, unspecified: Secondary | ICD-10-CM

## 2014-09-06 DIAGNOSIS — F439 Reaction to severe stress, unspecified: Secondary | ICD-10-CM

## 2014-09-06 DIAGNOSIS — Z86010 Personal history of colonic polyps: Secondary | ICD-10-CM

## 2014-09-06 DIAGNOSIS — Z79899 Other long term (current) drug therapy: Secondary | ICD-10-CM

## 2014-09-06 DIAGNOSIS — E039 Hypothyroidism, unspecified: Secondary | ICD-10-CM | POA: Diagnosis not present

## 2014-09-06 MED ORDER — SERTRALINE HCL 50 MG PO TABS: 50.0000 mg | ORAL_TABLET | Freq: Every day | ORAL | Status: DC

## 2014-09-06 MED ORDER — ALPRAZOLAM 0.25 MG PO TABS: 0.2500 mg | ORAL_TABLET | Freq: Three times a day (TID) | ORAL | Status: DC | PRN

## 2014-09-06 NOTE — Progress Notes (Signed)
Pre visit review using our clinic review tool, if applicable. No additional management support is needed unless otherwise documented below in the visit note.  Chief Complaint  Patient presents with  . Annual Exam    panic disorder     HPI: Patient  Karen Fleming  44 y.o. comes in today for Preventive Health Care visit and also to discuss the increasing symptoms of her panic disorder. Since her last visit she did complete evaluations through Dr.haygood  And  Dr Loreta Ave  Colonoscopy  Had some polyps  And  To do colon in 5 year.    She feels that removing the polyps helped her abdominal discomfort. She was also placed on Linzess in the meantime.  Anxiety  Worse   going for her masters and has to take some tests on line since last week we placed her back on Lexapro and she feels that Medicine  And    Not that good.  In day   Trying at night . Is jittery inside and feels that the Zoloft from years ago worked a lot better.  Thyroid pill changed now round.  About  One month.  But she had some of these symptoms before her thyroid pill was changed. She is here with her son today. Health Maintenance  Topic Date Due  . HIV Screening  08/24/2015 (Originally 09/30/1985)  . INFLUENZA VACCINE  10/24/2014  . PAP SMEAR  06/06/2015  . TETANUS/TDAP  02/26/2021   Health Maintenance Review LIFESTYLE:  Exercise:  Runs and uses this as a strategy to help her anxiety Tobacco/ETS: No Alcohol: per day no seen Sugar beverages: No Sleep: Okay Drug use: no PAP: n utd     ROS:  GEN/ HEENT: No fever, significant weight changes sweats headaches vision problems hearing changes, CV/ PULM; No chest pain shortness of breath cough, syncope,edema  change in exercise tolerance. GI /GU: No adominal pain, vomiting, change in bowel habits. No blood in the stool. No significant GU symptoms. SKIN/HEME: ,no acute skin rashes suspicious lesions or bleeding. No lymphadenopathy, nodules, masses.  NEURO/ PSYCH:  No neurologic  signs such as weakness numbness. No depression  IMM/ Allergy: No unusual infections.  Allergy .   REST of 12 system review negative except as per HPI   Past Medical History  Diagnosis Date  . Hypothyroidism   . Panic attacks     on meds pre pregnancy/hx  . Pancreatitis     2000 and 2005  . HELLP (hemolytic anemia/elev liver enzymes/low platelets in pregnancy)     c-section 2007  . Hyperlipidemia     Past Surgical History  Procedure Laterality Date  . Appendectomy  2001  . Cesarean section  2007    Family History  Problem Relation Age of Onset  . Heart attack Father     in 20s   . Hyperlipidemia Father   . Diabetes      gm and gf  . Hypertension Father     gm and gf  . Arthritis      gp ? OA  . Hyperlipidemia Mother   . Colon cancer Neg Hx   . Stomach cancer Neg Hx   . CVA Father     arrynthmia ? af  age 62   . Throat cancer Father     History   Social History  . Marital Status: Married    Spouse Name: N/A  . Number of Children: N/A  . Years of Education: N/A   Social History  Main Topics  . Smoking status: Never Smoker   . Smokeless tobacco: Never Used  . Alcohol Use: No  . Drug Use: No  . Sexual Activity: Not on file   Other Topics Concern  . None   Social History Narrative   HH of 3 pet  Dog     Young child at home   No ets   No caffeine   Drink milk 2-3 x per day    reg exercise    [place]             Outpatient Prescriptions Prior to Visit  Medication Sig Dispense Refill  . atorvastatin (LIPITOR) 10 MG tablet TAKE 1 BY MOUTH DAILY 90 tablet 3  . escitalopram (LEXAPRO) 10 MG tablet Take 5 mg for three days and then increase to 10 mg. 30 tablet 0  . levothyroxine (SYNTHROID, LEVOTHROID) 50 MCG tablet Take 1 tablet (50 mcg total) by mouth daily. 90 tablet 0  . LINZESS 290 MCG CAPS capsule   2   No facility-administered medications prior to visit.     EXAM:  BP 116/72 mmHg  Temp(Src) 98.4 F (36.9 C) (Oral)  Ht 5' 1.5"  (1.562 m)  Wt 146 lb 14.4 oz (66.633 kg)  BMI 27.31 kg/m2  LMP 08/07/2014  Body mass index is 27.31 kg/(m^2).  Physical Exam: Vital signs reviewed ZOX:WRUE is a well-developed well-nourished alert cooperative    who appearsr stated age in no acute distress  HEENT: normocephalic atraumatic , Eyes: PERRL EOM's full, conjunctiva clear, Nares: paten,t no deformity discharge or tenderness., Ears: no deformity EAC's clear TMs with normal landmarks. Mouth: clear OP, no lesions, edema.  Moist mucous membranes. Dentition in adequate repair. NECK: supple without masses, thyromegaly or bruits. CHEST/PULM:  Clear to auscultation and percussion breath sounds equal no wheeze , rales or rhonchi. No chest wall deformities or tenderness.Breast: normal by inspection . No dimpling, discharge, masses, tenderness or discharge . CV: PMI is nondisplaced, S1 S2 no gallops, murmurs, rubs. Peripheral pulses are full without delay.No JVD .  ABDOMEN: Bowel sounds normal nontender  No guard or rebound, no hepato splenomegal no CVA tenderness.  No hernia. Extremtities:  No clubbing cyanosis or edema, no acute joint swelling or redness no focal atrophy NEURO:  Oriented x3, cranial nerves 3-12 appear to be intact, no obvious focal weakness,gait within normal limits no abnormal reflexes or asymmetrical no tremor is noted. SKIN: No acute rashes normal turgor, color, no bruising or petechiae. PSYCH: Oriented, good eye contact, no obvious depression anxiety, cognition and judgment appear normal. LN: no cervical axillary inguinal adenopathy  Lab Results  Component Value Date   WBC 9.1 08/23/2014   HGB 13.4 08/23/2014   HCT 39.5 08/23/2014   PLT 321.0 08/23/2014   GLUCOSE 87 08/23/2014   CHOL 165 08/23/2014   TRIG 89.0 08/23/2014   HDL 55.40 08/23/2014   LDLDIRECT 173.7 02/20/2011   LDLCALC 92 08/23/2014   ALT 15 08/23/2014   AST 20 08/23/2014   NA 139 08/23/2014   K 3.6 08/23/2014   CL 105 08/23/2014   CREATININE  0.71 08/23/2014   BUN 14 08/23/2014   CO2 25 08/23/2014   TSH 1.13 08/23/2014    ASSESSMENT AND PLAN:  Discussed the following assessment and plan:  Visit for preventive health examination  PANIC DISORDER - had been stable restarted medication    Hypothyroidism, unspecified hypothyroidism type - at goal  Medication management  Hyperlipidemia - at goal.  Stress  Hx of colonic polyp - Follow Dr. Loreta Ave every 5 year: Patient appears to be up-to-date health care parameters her panic episodes. Becoming more frequent could be externally triggered by the stress and schooling. She did not have a good experience calling Rod[facility] Scarce what she feels is slow to respond to begin request for medication. See last week's notes. She accidentally missed her visit by miscommunication. Discussed use of benzos offered to her as needed but not to be used on a regular basis. This may help get her through the acute panic risk benefit of medication discussed and plan follow-up. Has switched her to Zoloft as she has used this in the remote past with some good help. Expectant management that'll take a while.    Patient Care Team: Madelin Headings, MD as PCP - General Hal Morales, MD (Obstetrics and Gynecology) Charna Elizabeth, MD as Consulting Physician (Gastroenterology) Patient Instructions  We can switch from lexapro to zoloft  Begin 50 mg of zoloft ( generic  ) may take 2-3 weeks to suppress the panic and consider  increase to 100 mg per day  . Can use rescue anxiety panic medication 2 x per day as needed for panic but take early   In episode . Continue other strategies  .  Alprazolam can help in the acute situation but not a long term medication as this can cause dependency and rebound anxiety if taken regularly for  Long term . Do not take with pregnancy .    Continue  Healthy lifestyle .  At least 150 minutes of exercise weeks  , weight at healthy levels, which is usually   BMI 19-25. Avoid trans fats and  processed foods;  Increase fresh fruits and veges to 5 servings per day. And avoid sweet beverages including tea and juice. Mediterranean diet with olive oil and nuts have been noted to be heart and brain healthy . Avoid tobacco products . Limit  alcohol to  7 per week for women and 14 servings for men.  Get adequate sleep . Wear seat belts . Don't text and drive .      Panic Attacks Panic attacks are sudden, short-livedsurges of severe anxiety, fear, or discomfort. They may occur for no reason when you are relaxed, when you are anxious, or when you are sleeping. Panic attacks may occur for a number of reasons:   Healthy people occasionally have panic attacks in extreme, life-threatening situations, such as war or natural disasters. Normal anxiety is a protective mechanism of the body that helps [place] react to danger (fight or flight response).  Panic attacks are often seen with anxiety disorders, such as panic disorder, social anxiety disorder, generalized anxiety disorder, and phobias. Anxiety disorders cause excessive or uncontrollable anxiety. They may interfere with your relationships or other life activities.  Panic attacks are sometimes seen with other mental illnesses, such as depression and posttraumatic stress disorder.  Certain medical conditions, prescription medicines, and drugs of abuse can cause panic attacks. SYMPTOMS  Panic attacks start suddenly, peak within 20 minutes, and are accompanied by four or more of the following symptoms:  Pounding heart or fast heart rate (palpitations).  Sweating.  Trembling or shaking.  Shortness of breath or feeling smothered.  Feeling choked.  Chest pain or discomfort.  Nausea or strange feeling in your stomach.  Dizziness, light-headedness, or feeling like you will faint.  Chills or hot flushes.  Numbness or tingling in your lips or hands and feet.  Feeling that things are  not real or feeling that you are not  yourself.  Fear of losing control or going crazy.  Fear of dying. Some of these symptoms can mimic serious medical conditions. For example, you may think you are having a heart attack. Although panic attacks can be very scary, they are not life threatening. DIAGNOSIS  Panic attacks are diagnosed through an assessment by your health care provider. Your health care provider will ask questions about your symptoms, such as where and when they occurred. Your health care provider will also ask about your medical history and use of alcohol and drugs, including prescription medicines. Your health care provider may order blood tests or other studies to rule out a serious medical condition. Your health care provider may refer you to a mental health professional for further evaluation. TREATMENT   Most healthy people who have one or two panic attacks in an extreme, life-threatening situation will not require treatment.  The treatment for panic attacks associated with anxiety disorders or other mental illness typically involves counseling with a mental health professional, medicine, or a combination of both. Your health care provider will help determine what treatment is best for you.  Panic attacks due to physical illness usually go away with treatment of the illness. If prescription medicine is causing panic attacks, talk with your health care provider about stopping the medicine, decreasing the dose, or substituting another medicine.  Panic attacks due to alcohol or drug abuse go away with abstinence. Some adults need professional help in order to stop drinking or using drugs. HOME CARE INSTRUCTIONS   Take all medicines as directed by your health care provider.   Schedule and attend follow-up visits as directed by your health care provider. It is important to keep all your appointments. SEEK MEDICAL CARE IF:  You are not able to take your medicines as prescribed.  Your symptoms do not improve or  get worse. SEEK IMMEDIATE MEDICAL CARE IF:   You experience panic attack symptoms that are different than your usual symptoms.  You have serious thoughts about hurting yourself or others.  You are taking medicine for panic attacks and have a serious side effect. MAKE SURE YOU:  Understand these instructions.  Will watch your condition.  Will get help right away if you are not doing well or get worse. Document Released: 03/11/2005 Document Revised: 03/16/2013 Document Reviewed: 10/23/2012 [facility] Patient Information 2015 [place], [place]. This information is not intended to replace advice given to you by your health care provider. Make sure you discuss any questions you have with your health care provider.  Try signing up for my chart also to help with communication accuracy .     Neta Mends. Panosh M.D.

## 2014-09-06 NOTE — Patient Instructions (Addendum)
We can switch from lexapro to zoloft  Begin 50 mg of zoloft ( generic  ) may take 2-3 weeks to suppress the panic and consider  increase to 100 mg per day  . Can use rescue anxiety panic medication 2 x per day as needed for panic but take early   In episode . Continue other strategies  .  Alprazolam can help in the acute situation but not a long term medication as this can cause dependency and rebound anxiety if taken regularly for  Long term . Do not take with pregnancy .    Continue  Healthy lifestyle .  At least 150 minutes of exercise weeks  , weight at healthy levels, which is usually   BMI 19-25. Avoid trans fats and processed foods;  Increase fresh fruits and veges to 5 servings per day. And avoid sweet beverages including tea and juice. Mediterranean diet with olive oil and nuts have been noted to be heart and brain healthy . Avoid tobacco products . Limit  alcohol to  7 per week for women and 14 servings for men.  Get adequate sleep . Wear seat belts . Don't text and drive .      Panic Attacks Panic attacks are sudden, short-livedsurges of severe anxiety, fear, or discomfort. They may occur for no reason when you are relaxed, when you are anxious, or when you are sleeping. Panic attacks may occur for a number of reasons:   Healthy people occasionally have panic attacks in extreme, life-threatening situations, such as war or natural disasters. Normal anxiety is a protective mechanism of the body that helps [place] react to danger (fight or flight response).  Panic attacks are often seen with anxiety disorders, such as panic disorder, social anxiety disorder, generalized anxiety disorder, and phobias. Anxiety disorders cause excessive or uncontrollable anxiety. They may interfere with your relationships or other life activities.  Panic attacks are sometimes seen with other mental illnesses, such as depression and posttraumatic stress disorder.  Certain medical conditions, prescription  medicines, and drugs of abuse can cause panic attacks. SYMPTOMS  Panic attacks start suddenly, peak within 20 minutes, and are accompanied by four or more of the following symptoms:  Pounding heart or fast heart rate (palpitations).  Sweating.  Trembling or shaking.  Shortness of breath or feeling smothered.  Feeling choked.  Chest pain or discomfort.  Nausea or strange feeling in your stomach.  Dizziness, light-headedness, or feeling like you will faint.  Chills or hot flushes.  Numbness or tingling in your lips or hands and feet.  Feeling that things are not real or feeling that you are not yourself.  Fear of losing control or going crazy.  Fear of dying. Some of these symptoms can mimic serious medical conditions. For example, you may think you are having a heart attack. Although panic attacks can be very scary, they are not life threatening. DIAGNOSIS  Panic attacks are diagnosed through an assessment by your health care provider. Your health care provider will ask questions about your symptoms, such as where and when they occurred. Your health care provider will also ask about your medical history and use of alcohol and drugs, including prescription medicines. Your health care provider may order blood tests or other studies to rule out a serious medical condition. Your health care provider may refer you to a mental health professional for further evaluation. TREATMENT   Most healthy people who have one or two panic attacks in an extreme, life-threatening situation will  not require treatment.  The treatment for panic attacks associated with anxiety disorders or other mental illness typically involves counseling with a mental health professional, medicine, or a combination of both. Your health care provider will help determine what treatment is best for you.  Panic attacks due to physical illness usually go away with treatment of the illness. If prescription medicine is  causing panic attacks, talk with your health care provider about stopping the medicine, decreasing the dose, or substituting another medicine.  Panic attacks due to alcohol or drug abuse go away with abstinence. Some adults need professional help in order to stop drinking or using drugs. HOME CARE INSTRUCTIONS   Take all medicines as directed by your health care provider.   Schedule and attend follow-up visits as directed by your health care provider. It is important to keep all your appointments. SEEK MEDICAL CARE IF:  You are not able to take your medicines as prescribed.  Your symptoms do not improve or get worse. SEEK IMMEDIATE MEDICAL CARE IF:   You experience panic attack symptoms that are different than your usual symptoms.  You have serious thoughts about hurting yourself or others.  You are taking medicine for panic attacks and have a serious side effect. MAKE SURE YOU:  Understand these instructions.  Will watch your condition.  Will get help right away if you are not doing well or get worse. Document Released: 03/11/2005 Document Revised: 03/16/2013 Document Reviewed: 10/23/2012 [facility] Patient Information 2015 [place], [place]. This information is not intended to replace advice given to you by your health care provider. Make sure you discuss any questions you have with your health care provider.  Try signing up for my chart also to help with communication accuracy .

## 2014-10-24 ENCOUNTER — Other Ambulatory Visit: Payer: Self-pay | Admitting: Internal Medicine

## 2014-10-25 NOTE — Telephone Encounter (Signed)
Sent to the pharmacy by e-scribe. 

## 2015-01-05 ENCOUNTER — Other Ambulatory Visit: Payer: Self-pay | Admitting: Internal Medicine

## 2015-01-06 NOTE — Telephone Encounter (Signed)
If she is doing ok  Then can refill for 6 months without a visit   Please contact her about how she is doing   dont want her to run out

## 2015-01-06 NOTE — Telephone Encounter (Signed)
Pt did not return for follow up

## 2015-02-08 ENCOUNTER — Telehealth: Payer: Self-pay | Admitting: Internal Medicine

## 2015-02-08 MED ORDER — ATORVASTATIN CALCIUM 10 MG PO TABS: ORAL_TABLET | ORAL | Status: DC

## 2015-02-08 NOTE — Telephone Encounter (Signed)
Pt needs new rx atorvastatin 10 mg #90 send to [facility] [place]. Pt no longer use [facility]

## 2015-02-08 NOTE — Telephone Encounter (Signed)
Sent to the pharmacy by e-scribe. 

## 2015-04-04 ENCOUNTER — Telehealth: Payer: Self-pay

## 2015-04-04 NOTE — Telephone Encounter (Signed)
E 

## 2015-04-06 ENCOUNTER — Other Ambulatory Visit: Payer: Self-pay

## 2015-04-06 MED ORDER — LEVOTHYROXINE SODIUM 50 MCG PO TABS: ORAL_TABLET | ORAL | Status: DC

## 2015-04-06 MED ORDER — ATORVASTATIN CALCIUM 10 MG PO TABS: ORAL_TABLET | ORAL | Status: DC

## 2015-04-06 MED ORDER — SERTRALINE HCL 50 MG PO TABS: ORAL_TABLET | ORAL | Status: DC

## 2015-04-06 NOTE — Telephone Encounter (Signed)
Sent to the pharmacy by e-scribe. 

## 2015-04-06 NOTE — Telephone Encounter (Signed)
RX REQUEST FROM [facility]:  Levothyroxine 50 MCG  Sertraline HCL 50 MG  Atrovastatin 10 MG

## 2015-05-31 ENCOUNTER — Encounter (HOSPITAL_COMMUNITY): Payer: Self-pay

## 2015-05-31 ENCOUNTER — Emergency Department (HOSPITAL_COMMUNITY)
Admission: EM | Admit: 2015-05-31 | Discharge: 2015-05-31 | Disposition: A | Discharge status: Home / Self Care | Payer: 59 | Attending: Emergency Medicine | Admitting: Emergency Medicine

## 2015-05-31 ENCOUNTER — Emergency Department (HOSPITAL_COMMUNITY): Payer: 59

## 2015-05-31 ENCOUNTER — Telehealth: Payer: Self-pay | Admitting: Internal Medicine

## 2015-05-31 DIAGNOSIS — R0789 Other chest pain: Secondary | ICD-10-CM | POA: Diagnosis not present

## 2015-05-31 DIAGNOSIS — E039 Hypothyroidism, unspecified: Secondary | ICD-10-CM | POA: Diagnosis not present

## 2015-05-31 DIAGNOSIS — M542 Cervicalgia: Secondary | ICD-10-CM | POA: Insufficient documentation

## 2015-05-31 DIAGNOSIS — Z79899 Other long term (current) drug therapy: Secondary | ICD-10-CM | POA: Diagnosis not present

## 2015-05-31 DIAGNOSIS — E785 Hyperlipidemia, unspecified: Secondary | ICD-10-CM | POA: Diagnosis not present

## 2015-05-31 DIAGNOSIS — M25512 Pain in left shoulder: Secondary | ICD-10-CM | POA: Insufficient documentation

## 2015-05-31 DIAGNOSIS — F410 Panic disorder [episodic paroxysmal anxiety] without agoraphobia: Secondary | ICD-10-CM | POA: Insufficient documentation

## 2015-05-31 LAB — CBC
HCT: 38.9 % (ref 36.0–46.0)
HEMOGLOBIN: 13 g/dL (ref 12.0–15.0)
MCH: 31.5 pg (ref 26.0–34.0)
MCHC: 33.4 g/dL (ref 30.0–36.0)
MCV: 94.2 fL (ref 78.0–100.0)
Platelets: 344 10*3/uL (ref 150–400)
RBC: 4.13 MIL/uL (ref 3.87–5.11)
RDW: 12.3 % (ref 11.5–15.5)
WBC: 12.9 10*3/uL — ABNORMAL HIGH (ref 4.0–10.5)

## 2015-05-31 LAB — BASIC METABOLIC PANEL
ANION GAP: 4 — AB (ref 5–15)
BUN: 16 mg/dL (ref 6–20)
CALCIUM: 9.3 mg/dL (ref 8.9–10.3)
CO2: 27 mmol/L (ref 22–32)
Chloride: 108 mmol/L (ref 101–111)
Creatinine, Ser: 0.67 mg/dL (ref 0.44–1.00)
GLUCOSE: 94 mg/dL (ref 65–99)
Potassium: 4.2 mmol/L (ref 3.5–5.1)
SODIUM: 139 mmol/L (ref 135–145)

## 2015-05-31 LAB — I-STAT TROPONIN, ED: TROPONIN I, POC: 0 ng/mL (ref 0.00–0.08)

## 2015-05-31 MED ORDER — HYDROCODONE-ACETAMINOPHEN 5-325 MG PO TABS: 1.0000 | ORAL_TABLET | ORAL | Status: DC | PRN

## 2015-05-31 MED ORDER — PREDNISONE 20 MG PO TABS: 20.0000 mg | ORAL_TABLET | Freq: Two times a day (BID) | ORAL | Status: DC

## 2015-05-31 NOTE — ED Provider Notes (Signed)
CSN: [id number]     Arrival date & time 05/31/15  1715 History   First MD Initiated Contact with Patient 05/31/15 2244     Chief Complaint  Patient presents with  . Chest Pain  . Neck Pain  . Shoulder Pain     (Consider location/radiation/quality/duration/timing/severity/associated sxs/prior Treatment) HPI   Karen Fleming is a 45 y.o. female presents for evaluation of left neck, left shoulder and left anterior chest pain. Pain present for 1 week and worse with movement of the left neck. No known trauma. No prior similar problem. No fever, chills, cough, shortness of breath, weakness or dizziness. There are no other known modifying factors.   Past Medical History  Diagnosis Date  . Hypothyroidism   . Panic attacks     on meds pre pregnancy/hx  . Pancreatitis     2000 and 2005  . HELLP (hemolytic anemia/elev liver enzymes/low platelets in pregnancy)     c-section 2007  . Hyperlipidemia    Past Surgical History  Procedure Laterality Date  . Appendectomy  2001  . Cesarean section  2007   Family History  Problem Relation Age of Onset  . Heart attack Father     in 8950s   . Hyperlipidemia Father   . Diabetes      gm and gf  . Hypertension Father     gm and gf  . Arthritis      gp ? OA  . Hyperlipidemia Mother   . Colon cancer Neg Hx   . Stomach cancer Neg Hx   . CVA Father     arrynthmia ? af  age 46   . Throat cancer Father    Social History  Substance Use Topics  . Smoking status: Never Smoker   . Smokeless tobacco: Never Used  . Alcohol Use: No   OB History    No data available     Review of Systems  All other systems reviewed and are negative.     Allergies  Review of patient's allergies indicates no known allergies.  Home Medications   Prior to Admission medications   Medication Sig Start Date End Date Taking? Authorizing Provider  ALPRAZolam (XANAX) 0.25 MG tablet Take 1 tablet (0.25 mg total) by mouth 3 (three) times daily as needed (if needed  for panic attack). 09/06/14   Madelin HeadingsWanda K Panosh, MD  atorvastatin (LIPITOR) 10 MG tablet TAKE 1 BY MOUTH DAILY 04/06/15   Madelin HeadingsWanda K Panosh, MD  escitalopram (LEXAPRO) 10 MG tablet Take 5 mg for three days and then increase to 10 mg. 08/31/14   Madelin HeadingsWanda K Panosh, MD  levothyroxine (SYNTHROID, LEVOTHROID) 50 MCG tablet TAKE 1 TABLET(50 MCG) BY MOUTH DAILY 04/06/15   Madelin HeadingsWanda K Panosh, MD  LINZESS 290 MCG CAPS capsule  04/13/14   Historical Provider, MD  sertraline (ZOLOFT) 50 MG tablet TAKE 1 TABLET(50 MG) BY MOUTH DAILY 04/06/15   Madelin HeadingsWanda K Panosh, MD   LMP 05/10/2015 Physical Exam  Constitutional: She is oriented to person, place, and time. She appears well-developed and well-nourished. No distress.  HENT:  Head: Normocephalic and atraumatic.  Right Ear: External ear normal.  Left Ear: External ear normal.  Eyes: Conjunctivae and EOM are normal. Pupils are equal, round, and reactive to light.  Neck: Normal range of motion and phonation normal. Neck supple.  Cardiovascular: Normal rate, regular rhythm and normal heart sounds.   Pulmonary/Chest: Effort normal and breath sounds normal. No respiratory distress. She has no wheezes. She exhibits no  tenderness and no bony tenderness.  Abdominal: Soft. There is no tenderness.  Musculoskeletal: Normal range of motion.  Mild left lateralneck and left trapezius muscle pain. Normal range of motion, neck. Normal range of motion, arms, and shoulders bilaterally.  Neurological: She is alert and oriented to person, place, and time. No cranial nerve deficit or sensory deficit. She exhibits normal muscle tone. Coordination normal.  Skin: Skin is warm, dry and intact.  Psychiatric: She has a normal mood and affect. Her behavior is normal. Judgment and thought content normal.  Nursing note and vitals reviewed.   ED Course  Procedures (including critical care time)  Medications - No data to display  Patient Vitals for the past 24 hrs:  BP Temp Temp src Pulse Resp SpO2   05/31/15 2302 126/74 mmHg 98.4 F (36.9 C) Oral 60 18 100 %    11:03 PM Reevaluation with update and discussion. After initial assessment and treatment, an updated evaluation reveals No change in clinical status. Findings discussed with patient, all questions answered. Dan Scearce L     Labs Review Labs Reviewed  BASIC METABOLIC PANEL - Abnormal; Notable for the following:    Anion gap 4 (*)    All other components within normal limits  CBC - Abnormal; Notable for the following:    WBC 12.9 (*)    All other components within normal limits  I-STAT TROPOININ, ED    Imaging Review Dg Chest 2 View  05/31/2015  CLINICAL DATA:  Left side chest pain and heaviness for 1 week EXAM: CHEST  2 VIEW COMPARISON:  None. FINDINGS: The heart size and mediastinal contours are within normal limits. Both lungs are clear. The visualized skeletal structures are unremarkable. IMPRESSION: No active cardiopulmonary disease. Electronically Signed   By: Natasha Mead M.D.   On: 05/31/2015 18:21   I have personally reviewed and evaluated these images and lab results as part of my medical decision-making.   EKG Interpretation   Date/Time:  Wednesday May 31 2015 17:35:04 EST Ventricular Rate:  68 PR Interval:  128 QRS Duration: 78 QT Interval:  383 QTC Calculation: 407 R Axis:   56 Text Interpretation:  Sinus rhythm No old tracing to compare Confirmed by  [facility]  MD, Joey Lierman [phone]) on 05/31/2015 10:49:42 PM      MDM   Final diagnoses:  Neck pain    Left neck pain, musculoskeletal versus radicular, although suspected fracture or spinal myelopathy.  Nursing Notes Reviewed/ Care Coordinated Applicable Imaging Reviewed Interpretation of Laboratory Data incorporated into ED treatment  The patient appears reasonably screened and/or stabilized for discharge and I doubt any other medical condition or other [facility] requiring further screening, evaluation, or treatment in the ED at this time prior to  discharge.  Plan: Home Medications- Prednisone, Norco; Home Treatments- heat; return here if the recommended treatment, does not improve the symptoms; Recommended follow up- PCP prn     Mancel Bale, MD 05/31/15 2308

## 2015-05-31 NOTE — Telephone Encounter (Signed)
[facility] - Client TELEPHONE ADVICE RECORD [facility] Patient Name: Karen BatmanCLAUDIA Fleming DOB: 12/29/1970 Initial Comment caller states that she has a pain in her thigh, might be muscle pain. Also pain in chest and neck. Nurse Assessment Nurse: Ladona RidgelGaddy, RN, Felicia Date/Time (Eastern Time): 05/31/2015 4:07:40 PM Confirm and document reason for call. If symptomatic, describe symptoms. You must click the next button to save text entered. ---PT has pain in neck and goes down to chest and back and L arm. She is not sure if it is bc she has been running without warming up - onset 1 week ago. No fever Has the patient traveled out of the country within the last 30 days? ---No Does the patient have any new or worsening symptoms? ---Yes Will a triage be completed? ---Yes Related visit to physician within the last 2 weeks? ---No Does the PT have any chronic conditions? (i.e. diabetes, asthma, etc.) ---NoIs this a behavioral health or substance abuse call? ---No Guidelines Guideline Title Affirmed Question Affirmed Notes Chest Pain [1] Chest pain lasts > 5 minutes AND [2] described as crushing, pressure-like, or heavy Final Disposition User Call EMS 911 Now RoscoeGaddy, RN, Sunny SchleinFelicia Disagree/Comply: Comply Call Id: [phone]

## 2015-05-31 NOTE — ED Notes (Addendum)
Pt c/o constant L side chest "heaviness" w/ intermittent sharp pains x 1 week and L side neck, shoulder, and arm pain x 2 days.  Pain score 7/10.  Denies injury.  Denies increasing chest pain w/ movement.  Pt reports taking 162mg  aspirin this morning w/ relief.  Hx of HTN and high cholesterol.

## 2015-05-31 NOTE — Telephone Encounter (Signed)
Spoke to the pt.  She informed me that she is currently in the ED parking lot.  Will continue to watch.

## 2015-05-31 NOTE — Discharge Instructions (Signed)
Use heat on the sore area 3 or 4 times a day. ° ° °Musculoskeletal Pain °Musculoskeletal pain is muscle and boney aches and pains. These pains can occur in any part of the body. Your caregiver may treat you without knowing the cause of the pain. They may treat you if blood or urine tests, X-rays, and other tests were normal.  °CAUSES °There is often not a definite cause or reason for these pains. These pains may be caused by a type of germ (virus). The discomfort may also come from overuse. Overuse includes working out too hard when your body is not fit. Boney aches also come from weather changes. Bone is sensitive to atmospheric pressure changes. °HOME CARE INSTRUCTIONS  °· Ask when your test results will be ready. Make sure you get your test results. °· Only take over-the-counter or prescription medicines for pain, discomfort, or fever as directed by your caregiver. If you were given medications for your condition, do not drive, operate machinery or power tools, or sign legal documents for 24 hours. Do not drink alcohol. Do not take sleeping pills or other medications that may interfere with treatment. °· Continue all activities unless the activities cause more pain. When the pain lessens, slowly resume normal activities. Gradually increase the intensity and duration of the activities or exercise. °· During periods of severe pain, bed rest may be helpful. Lay or sit in any position that is comfortable. °· Putting ice on the injured area. °¨ Put ice in a bag. °¨ Place a towel between your skin and the bag. °¨ Leave the ice on for 15 to 20 minutes, 3 to 4 times a day. °· Follow up with your caregiver for continued problems and no reason can be found for the pain. If the pain becomes worse or does not go away, it may be necessary to repeat tests or do additional testing. Your caregiver may need to look further for a possible cause. °SEEK IMMEDIATE MEDICAL CARE IF: °· You have pain that is getting worse and is not  relieved by medications. °· You develop chest pain that is associated with shortness or breath, sweating, feeling sick to your stomach (nauseous), or throw up (vomit). °· Your pain becomes localized to the abdomen. °· You develop any new symptoms that seem different or that concern you. °MAKE SURE YOU:  °· Understand these instructions. °· Will watch your condition. °· Will get help right away if you are not doing well or get worse. °  °This information is not intended to replace advice given to you by your health care provider. Make sure you discuss any questions you have with your health care provider. °  °Document Released: 03/11/2005 Document Revised: 06/03/2011 Document Reviewed: 11/13/2012 °Elsevier Interactive Patient Education ©2016 [employer] ° °

## 2015-05-31 NOTE — Telephone Encounter (Signed)
[facility] - Client °TELEPHONE ADVICE RECORD °[facility] °Patient Name: Karen Fleming °DOB: 08/17/1970 °Initial Comment caller states that she has a pain in her thigh, might °be muscle pain. Also pain in chest and neck. °Nurse Assessment °Nurse: Gaddy, RN, Felicia Date/Time (Eastern Time): 05/31/2015 4:07:40 PM °Confirm and document reason for call. If symptomatic, °describe symptoms. You must click the next button to save text °entered. °---PT has pain in neck and goes down to chest and back and °L arm. She is not sure if it is bc she has been running without °warming up - onset 1 week ago. No fever °Has the patient traveled out of the country within the last 30 °days? °---No °Does the patient have any new or worsening symptoms? ---Yes °Will a triage be completed? ---Yes °Related visit to physician within the last 2 weeks? ---No °Does the PT have any chronic conditions? (i.e. diabetes, °asthma, etc.) °---NoIs this a behavioral health or substance abuse call? ---No °Guidelines °Guideline Title Affirmed Question Affirmed Notes °Chest Pain [1] Chest pain lasts > 5 minutes AND [2] °described as crushing, pressure-like, or °heavy °Final Disposition User °Call EMS 911 Now Gaddy, RN, Felicia °Disagree/Comply: Comply Call Id: [id number] °

## 2015-06-01 NOTE — Telephone Encounter (Signed)
Pt seen in ED 

## 2015-09-07 ENCOUNTER — Other Ambulatory Visit: Payer: Self-pay | Admitting: Internal Medicine

## 2015-09-07 ENCOUNTER — Other Ambulatory Visit: Payer: Self-pay | Admitting: Family Medicine

## 2015-09-07 ENCOUNTER — Telehealth: Payer: Self-pay | Admitting: Family Medicine

## 2015-09-07 DIAGNOSIS — Z0000 Encounter for general adult medical examination without abnormal findings: Secondary | ICD-10-CM

## 2015-09-07 NOTE — Telephone Encounter (Signed)
Pt has been sch

## 2015-09-07 NOTE — Telephone Encounter (Signed)
Pt now due for cpx and lab work.  I have placed the lab orders.  Please help her to make both appointments.  Thanks!! 

## 2015-09-07 NOTE — Telephone Encounter (Signed)
Sent to the pharmacy by e-scribe. Message sent to scheduling.  Pt now due for cpx and lab work.  Lab orders placed.

## 2015-09-13 ENCOUNTER — Other Ambulatory Visit (INDEPENDENT_AMBULATORY_CARE_PROVIDER_SITE_OTHER): Payer: 59

## 2015-09-13 DIAGNOSIS — Z0000 Encounter for general adult medical examination without abnormal findings: Secondary | ICD-10-CM

## 2015-09-13 LAB — HEPATIC FUNCTION PANEL
ALBUMIN: 4 g/dL (ref 3.5–5.2)
ALK PHOS: 66 U/L (ref 39–117)
ALT: 13 U/L (ref 0–35)
AST: 16 U/L (ref 0–37)
BILIRUBIN DIRECT: 0.1 mg/dL (ref 0.0–0.3)
BILIRUBIN TOTAL: 0.6 mg/dL (ref 0.2–1.2)
Total Protein: 6.7 g/dL (ref 6.0–8.3)

## 2015-09-13 LAB — BASIC METABOLIC PANEL
BUN: 13 mg/dL (ref 6–23)
CHLORIDE: 104 meq/L (ref 96–112)
CO2: 27 meq/L (ref 19–32)
CREATININE: 0.67 mg/dL (ref 0.40–1.20)
Calcium: 9.1 mg/dL (ref 8.4–10.5)
GFR: 100.74 mL/min (ref >60.00)
Glucose, Bld: 100 mg/dL — ABNORMAL HIGH (ref 70–99)
POTASSIUM: 4.2 meq/L (ref 3.5–5.1)
Sodium: 139 mEq/L (ref 135–145)

## 2015-09-13 LAB — CBC WITH DIFFERENTIAL/PLATELET
BASOS PCT: 0.3 % (ref 0.0–3.0)
Basophils Absolute: 0 10*3/uL (ref 0.0–0.1)
EOS ABS: 0.3 10*3/uL (ref 0.0–0.7)
EOS PCT: 2.3 % (ref 0.0–5.0)
HEMATOCRIT: 37.3 % (ref 36.0–46.0)
Hemoglobin: 12.4 g/dL (ref 12.0–15.0)
LYMPHS PCT: 16.9 % (ref 12.0–46.0)
Lymphs Abs: 1.9 10*3/uL (ref 0.7–4.0)
MCHC: 33.3 g/dL (ref 30.0–36.0)
MCV: 90.6 fl (ref 78.0–100.0)
Monocytes Absolute: 0.9 10*3/uL (ref 0.1–1.0)
Monocytes Relative: 7.8 % (ref 3.0–12.0)
NEUTROS ABS: 8.3 10*3/uL — AB (ref 1.4–7.7)
Neutrophils Relative %: 72.7 % (ref 43.0–77.0)
PLATELETS: 350 10*3/uL (ref 150.0–400.0)
RBC: 4.12 Mil/uL (ref 3.87–5.11)
RDW: 12.3 % (ref 11.5–15.5)
WBC: 11.4 10*3/uL — ABNORMAL HIGH (ref 4.0–10.5)

## 2015-09-13 LAB — LIPID PANEL
CHOL/HDL RATIO: 4
Cholesterol: 186 mg/dL (ref 0–200)
HDL: 46.1 mg/dL (ref >39.00)
LDL CALC: 123 mg/dL — AB (ref 0–99)
NONHDL: 139.41
TRIGLYCERIDES: 83 mg/dL (ref 0.0–149.0)
VLDL: 16.6 mg/dL (ref 0.0–40.0)

## 2015-09-13 LAB — TSH: TSH: 1.58 u[IU]/mL (ref 0.35–4.50)

## 2015-10-04 ENCOUNTER — Encounter: Payer: Self-pay | Admitting: Internal Medicine

## 2015-10-04 ENCOUNTER — Ambulatory Visit (INDEPENDENT_AMBULATORY_CARE_PROVIDER_SITE_OTHER): Payer: 59 | Admitting: Internal Medicine

## 2015-10-04 VITALS — BP 126/80 | Temp 98.3°F | Ht 61.75 in | Wt 151.6 lb

## 2015-10-04 DIAGNOSIS — E785 Hyperlipidemia, unspecified: Secondary | ICD-10-CM

## 2015-10-04 DIAGNOSIS — E039 Hypothyroidism, unspecified: Secondary | ICD-10-CM

## 2015-10-04 DIAGNOSIS — Z0000 Encounter for general adult medical examination without abnormal findings: Secondary | ICD-10-CM

## 2015-10-04 DIAGNOSIS — Z79899 Other long term (current) drug therapy: Secondary | ICD-10-CM | POA: Diagnosis not present

## 2015-10-04 NOTE — Progress Notes (Signed)
Pre visit review using our clinic review tool, if applicable. No additional management support is needed unless otherwise documented below in the visit note.  Chief Complaint  Patient presents with  . Annual Exam    HPI: Patient  Karen Fleming  45 y.o. comes in today for Preventive Health Care visit   meds taking lipid med most days misses a couple days per week no se and wants to remainon it   Thyroid taking same med no  Changes   Sees Dr Leo Grosser gyne  Yearly   Panic anxiety is doing well  No co now on meds   Only taking linzess ocass rare    Health Maintenance  Topic Date Due  . HIV Screening  10/03/2016 (Originally 09/30/1985)  . INFLUENZA VACCINE  10/24/2015  . PAP SMEAR  10/19/2016  . TETANUS/TDAP  02/26/2021   Health Maintenance Review LIFESTYLE:  Exercise:   Running ans walking  Tobacco/ETS:  no Alcohol:  no Sugar beverages: ocass  Sleep: 6-7  Drug use: no [employer]     ROS:  GEN/ HEENT: No fever, significant weight changes sweats headaches vision problems hearing changes, CV/ PULM; No chest pain shortness of breath cough, syncope,edema  change in exercise tolerance. GI /GU: No adominal pain, vomiting, change in bowel habits. No blood in the stool. No significant GU symptoms. SKIN/HEME: ,no acute skin rashes suspicious lesions or bleeding. No lymphadenopathy, nodules, masses.  NEURO/ PSYCH:  No neurologic signs such as weakness numbness. No depression anxiety. IMM/ Allergy: No unusual infections.  Allergy .   REST of 12 system review negative except as per HPI   Past Medical History  Diagnosis Date  . Hypothyroidism   . Panic attacks     on meds pre pregnancy/hx  . Pancreatitis     2000 and 2005  . HELLP (hemolytic anemia/elev liver enzymes/low platelets in pregnancy)     c-section 2007  . Hyperlipidemia     Past Surgical History  Procedure Laterality Date  . Appendectomy  2001  . Cesarean section  2007    Family History  Problem  Relation Age of Onset  . Heart attack Father     in 52s   . Hyperlipidemia Father   . Diabetes      gm and gf  . Hypertension Father     gm and gf  . Arthritis      gp ? OA  . Hyperlipidemia Mother   . Colon cancer Neg Hx   . Stomach cancer Neg Hx   . CVA Father     arrynthmia ? af  age 63   . Throat cancer Father     Social History   Social History  . Marital Status: Married    Spouse Name: N/A  . Number of Children: N/A  . Years of Education: N/A   Social History Main Topics  . Smoking status: Never Smoker   . Smokeless tobacco: Never Used  . Alcohol Use: No  . Drug Use: No  . Sexual Activity: Not Asked   Other Topics Concern  . None   Social History Narrative   HH of 3 pet  Dog     5 yo son child at home [place]   No ets   No caffeine   Drink milk 2-3 x per day    reg exercise    [place]             Outpatient Prescriptions Prior to Visit  Medication Sig Dispense Refill  . atorvastatin (LIPITOR) 10 MG tablet Take 1 tablet by mouth  daily 90 tablet 0  . levothyroxine (SYNTHROID, LEVOTHROID) 50 MCG tablet Take 1 tablet by mouth  daily 90 tablet 0  . LINZESS 290 MCG CAPS capsule Take 290 mcg by mouth daily as needed (constipation).   2  . sertraline (ZOLOFT) 50 MG tablet Take 1 tablet by mouth  daily 90 tablet 0  . ALPRAZolam (XANAX) 0.25 MG tablet Take 1 tablet (0.25 mg total) by mouth 3 (three) times daily as needed (if needed for panic attack). 24 tablet 0  . aspirin EC 325 MG tablet Take 325 mg by mouth once.    . escitalopram (LEXAPRO) 10 MG tablet Take 5 mg for three days and then increase to 10 mg. (Patient taking differently: Take 10 mg by mouth daily. Take 5 mg for three days and then increase to 10 mg.) 30 tablet 0  . HYDROcodone-acetaminophen (NORCO) 5-325 MG tablet Take 1 tablet by mouth every 4 (four) hours as needed. 15 tablet 0  . predniSONE (DELTASONE) 20 MG tablet Take 1 tablet (20 mg total) by mouth 2 (two) times daily. 10  tablet 0   No facility-administered medications prior to visit.     EXAM:  BP 126/80 mmHg  Temp(Src) 98.3 F (36.8 C) (Oral)  Ht 5' 1.75" (1.568 m)  Wt 151 lb 9.6 oz (68.765 kg)  BMI 27.97 kg/m2  Body mass index is 27.97 kg/(m^2).  Physical Exam: Vital signs reviewed XBD:ZHGD is a well-developed well-nourished alert cooperative    who appearsr stated age in no acute distress.  HEENT: normocephalic atraumatic , Eyes: PERRL EOM's full, conjunctiva clear, Nares: paten,t no deformity discharge or tenderness., Ears: no deformity EAC's clear TMs with normal landmarks. Mouth: clear OP, no lesions, edema.  Moist mucous membranes. Dentition in adequate repair. NECK: supple without masses, thyromegaly or bruits. CHEST/PULM:  Clear to auscultation and percussion breath sounds equal no wheeze , rales or rhonchi. No chest wall deformities or tenderness. CV: PMI is nondisplaced, S1 S2 no gallops, murmurs, rubs. Peripheral pulses are full without delay.No JVD . Breast: normal by inspection . No dimpling, discharge, masses, tenderness or discharge . ABDOMEN: Bowel sounds normal nontender  No guard or rebound, no hepato splenomegal no CVA tenderness.  No hernia. Extremtities:  No clubbing cyanosis or edema, no acute joint swelling or redness no focal atrophy NEURO:  Oriented x3, cranial nerves 3-12 appear to be intact, no obvious focal weakness,gait within normal limits no abnormal reflexes or asymmetrical SKIN: No acute rashes normal turgor, color, no bruising or petechiae. PSYCH: Oriented, good eye contact, no obvious depression anxiety, cognition and judgment appear normal. LN: no cervical axillary inguinal adenopathy  Lab Results  Component Value Date   WBC 11.4* 09/13/2015   HGB 12.4 09/13/2015   HCT 37.3 09/13/2015   PLT 350.0 09/13/2015   GLUCOSE 100* 09/13/2015   CHOL 186 09/13/2015   TRIG 83.0 09/13/2015   HDL 46.10 09/13/2015   LDLDIRECT 173.7 02/20/2011   LDLCALC 123* 09/13/2015     ALT 13 09/13/2015   AST 16 09/13/2015   NA 139 09/13/2015   K 4.2 09/13/2015   CL 104 09/13/2015   CREATININE 0.67 09/13/2015   BUN 13 09/13/2015   CO2 27 09/13/2015   TSH 1.58 09/13/2015    ASSESSMENT AND PLAN:  Discussed the following assessment and plan:  Visit for preventive health examination  Hypothyroidism, unspecified hypothyroidism type - at goal  no change  Medication management  Hyperlipidemia - up some missed doses of med lsi and wants toremain on med  SDM   Risk benefit of medication discussed. yearlyr check up with labs  Patient Care Team: Burnis Medin, MD as PCP - General Eldred Manges, MD (Obstetrics and Gynecology) Juanita Craver, MD as Consulting Physician (Gastroenterology) Patient Instructions  Continue lifestyle intervention healthy eating and exercise . Get yearly blood test  cbcdiff  And thryoid and cholesterol and blood sugar as you are doing . Glad you are doing well .    Health Maintenance, Female Adopting a healthy lifestyle and getting preventive care can go a long way to promote health and wellness. Talk with your health care provider about what schedule of regular examinations is right for you. This is a good chance for you to check in with your provider about disease prevention and staying healthy. In between checkups, there are plenty of things you can do on your own. Experts have done a lot of research about which lifestyle changes and preventive measures are most likely to keep you healthy. Ask your health care provider for more information. WEIGHT AND DIET  Eat a healthy diet  Be sure to include plenty of vegetables, fruits, low-fat dairy products, and lean protein.  Do not eat a lot of foods high in solid fats, added sugars, or salt.  Get regular exercise. This is one of the most important things you can do for your health.  Most adults should exercise for at least 150 minutes each week. The exercise should increase your heart  rate and make you sweat (moderate-intensity exercise).  Most adults should also do strengthening exercises at least twice a week. This is in addition to the moderate-intensity exercise.  Maintain a healthy weight  Body mass index (BMI) is a measurement that can be used to identify possible weight problems. It estimates body fat based on height and weight. Your health care provider can help determine your BMI and help you achieve or maintain a healthy weight.  For females 72 years of age and older:   A BMI below 18.5 is considered underweight.  A BMI of 18.5 to 24.9 is normal.  A BMI of 25 to 29.9 is considered overweight.  A BMI of 30 and above is considered obese.  Watch levels of cholesterol and blood lipids  You should start having your blood tested for lipids and cholesterol at 46 years of age, then have this test every 5 years.  You may need to have your cholesterol levels checked more often if:  Your lipid or cholesterol levels are high.  You are older than 46 years of age.  You are at high risk for heart disease.  CANCER SCREENING   Lung Cancer  Lung cancer screening is recommended for adults 64-58 years old who are at high risk for lung cancer because of a history of smoking.  A yearly low-dose CT scan of the lungs is recommended for people who:  Currently smoke.  Have quit within the past 15 years.  Have at least a 30-pack-year history of smoking. A pack year is smoking an average of one pack of cigarettes a day for 1 year.  Yearly screening should continue until it has been 15 years since you quit.  Yearly screening should stop if you develop a health problem that would prevent you from having lung cancer treatment.  Breast Cancer  Practice breast self-awareness. This means understanding how your breasts normally  appear and feel.  It also means doing regular breast self-exams. Let your health care provider know about any changes, no matter how  small.  If you are in your 20s or 30s, you should have a clinical breast exam (CBE) by a health care provider every 1-3 years as part of a regular health exam.  If you are 28 or older, have a CBE every year. Also consider having a breast X-ray (mammogram) every year.  If you have a family history of breast cancer, talk to your health care provider about genetic screening.  If you are at high risk for breast cancer, talk to your health care provider about having an MRI and a mammogram every year.  Breast cancer gene (BRCA) assessment is recommended for women who have family members with BRCA-related cancers. BRCA-related cancers include:  Breast.  Ovarian.  Tubal.  Peritoneal cancers.  Results of the assessment will determine the need for genetic counseling and BRCA1 and BRCA2 testing. Cervical Cancer Your health care provider may recommend that you be screened regularly for cancer of the pelvic organs (ovaries, uterus, and vagina). This screening involves a pelvic examination, including checking for microscopic changes to the surface of your cervix (Pap test). You may be encouraged to have this screening done every 3 years, beginning at age 37.  For women ages 45-65, health care providers may recommend pelvic exams and Pap testing every 3 years, or they may recommend the Pap and pelvic exam, combined with testing for human papilloma virus (HPV), every 5 years. Some types of HPV increase your risk of cervical cancer. Testing for HPV may also be done on women of any age with unclear Pap test results.  Other health care providers may not recommend any screening for nonpregnant women who are considered low risk for pelvic cancer and who do not have symptoms. Ask your health care provider if a screening pelvic exam is right for you.  If you have had past treatment for cervical cancer or a condition that could lead to cancer, you need Pap tests and screening for cancer for at least 20 years  after your treatment. If Pap tests have been discontinued, your risk factors (such as having a new sexual partner) need to be reassessed to determine if screening should resume. Some women have medical problems that increase the chance of getting cervical cancer. In these cases, your health care provider may recommend more frequent screening and Pap tests. Colorectal Cancer  This type of cancer can be detected and often prevented.  Routine colorectal cancer screening usually begins at 46 years of age and continues through 46 years of age.  Your health care provider may recommend screening at an earlier age if you have risk factors for colon cancer.  Your health care provider may also recommend using home test kits to check for hidden blood in the stool.  A small camera at the end of a tube can be used to examine your colon directly (sigmoidoscopy or colonoscopy). This is done to check for the earliest forms of colorectal cancer.  Routine screening usually begins at age 71.  Direct examination of the colon should be repeated every 5-10 years through 46 years of age. However, you may need to be screened more often if early forms of precancerous polyps or small growths are found. Skin Cancer  Check your skin from head to toe regularly.  Tell your health care provider about any new moles or changes in moles, especially if there is  a change in a mole's shape or color.  Also tell your health care provider if you have a mole that is larger than the size of a pencil eraser.  Always use sunscreen. Apply sunscreen liberally and repeatedly throughout the day.  Protect yourself by wearing long sleeves, pants, a wide-brimmed hat, and sunglasses whenever you are outside. HEART DISEASE, DIABETES, AND HIGH BLOOD PRESSURE   High blood pressure causes heart disease and increases the risk of stroke. High blood pressure is more likely to develop in:  People who have blood pressure in the high end of the  normal range (130-139/85-89 mm Hg).  People who are overweight or obese.  People who are African American.  If you are 50-7 years of age, have your blood pressure checked every 3-5 years. If you are 11 years of age or older, have your blood pressure checked every year. You should have your blood pressure measured twice--once when you are at a hospital or clinic, and once when you are not at a hospital or clinic. Record the average of the two measurements. To check your blood pressure when you are not at a hospital or clinic, you can use:  An automated blood pressure machine at a pharmacy.  A home blood pressure monitor.  If you are between 30 years and 27 years old, ask your health care provider if you should take aspirin to prevent strokes.  Have regular diabetes screenings. This involves taking a blood sample to check your fasting blood sugar level.  If you are at a normal weight and have a low risk for diabetes, have this test once every three years after 46 years of age.  If you are overweight and have a high risk for diabetes, consider being tested at a younger age or more often. PREVENTING INFECTION  Hepatitis B  If you have a higher risk for hepatitis B, you should be screened for this virus. You are considered at high risk for hepatitis B if:  You were born in a country where hepatitis B is common. Ask your health care provider which countries are considered high risk.  Your parents were born in a high-risk country, and you have not been immunized against hepatitis B (hepatitis B vaccine).  You have HIV or AIDS.  You use needles to inject street drugs.  You live with someone who has hepatitis B.  You have had sex with someone who has hepatitis B.  You get hemodialysis treatment.  You take certain medicines for conditions, including cancer, organ transplantation, and autoimmune conditions. Hepatitis C  Blood testing is recommended for:  Everyone born from [birth year]  through 1966.  Anyone with known risk factors for hepatitis C. Sexually transmitted infections (STIs)  You should be screened for sexually transmitted infections (STIs) including gonorrhea and chlamydia if:  You are sexually active and are younger than 46 years of age.  You are older than 46 years of age and your health care provider tells you that you are at risk for this type of infection.  Your sexual activity has changed since you were last screened and you are at an increased risk for chlamydia or gonorrhea. Ask your health care provider if you are at risk.  If you do not have HIV, but are at risk, it may be recommended that you take a prescription medicine daily to prevent HIV infection. This is called pre-exposure prophylaxis (PrEP). You are considered at risk if:  You are sexually active and do not regularly  use condoms or know the HIV status of your partner(s).  You take drugs by injection.  You are sexually active with a partner who has HIV. Talk with your health care provider about whether you are at high risk of being infected with HIV. If you choose to begin PrEP, you should first be tested for HIV. You should then be tested every 3 months for as long as you are taking PrEP.  PREGNANCY   If you are premenopausal and you may become pregnant, ask your health care provider about preconception counseling.  If you may become pregnant, take 400 to 800 micrograms (mcg) of folic acid every day.  If you want to prevent pregnancy, talk to your health care provider about birth control (contraception). OSTEOPOROSIS AND MENOPAUSE   Osteoporosis is a disease in which the bones lose minerals and strength with aging. This can result in serious bone fractures. Your risk for osteoporosis can be identified using a bone density scan.  If you are 39 years of age or older, or if you are at risk for osteoporosis and fractures, ask your health care provider if you should be screened.  Ask your  health care provider whether you should take a calcium or vitamin D supplement to lower your risk for osteoporosis.  Menopause may have certain physical symptoms and risks.  Hormone replacement therapy may reduce some of these symptoms and risks. Talk to your health care provider about whether hormone replacement therapy is right for you.  HOME CARE INSTRUCTIONS   Schedule regular health, dental, and eye exams.  Stay current with your immunizations.   Do not use any tobacco products including cigarettes, chewing tobacco, or electronic cigarettes.  If you are pregnant, do not drink alcohol.  If you are breastfeeding, limit how much and how often you drink alcohol.  Limit alcohol intake to no more than 1 drink per day for nonpregnant women. One drink equals 12 ounces of beer, 5 ounces of wine, or 1 ounces of hard liquor.  Do not use street drugs.  Do not share needles.  Ask your health care provider for help if you need support or information about quitting drugs.  Tell your health care provider if you often feel depressed.  Tell your health care provider if you have ever been abused or do not feel safe at home.   This information is not intended to replace advice given to you by your health care provider. Make sure you discuss any questions you have with your health care provider.   Document Released: 09/24/2010 Document Revised: 04/01/2014 Document Reviewed: 02/10/2013 Elsevier Interactive Patient Education 2016 [place] protect organs, store calcium, and anchor muscles. Good health habits, such as eating nutritious foods and exercising regularly, are important for maintaining healthy bones. They can also help to prevent a condition that causes bones to lose density and become weak and brittle (osteoporosis). WHY IS BONE MASS IMPORTANT? Bone mass refers to the amount of bone tissue that you have. The higher your bone mass, the stronger your bones. An  important step toward having healthy bones throughout life is to have strong and dense bones during childhood. A young adult who has a high bone mass is more likely to have a high bone mass later in life. Bone mass at its greatest it is called peak bone mass. A large decline in bone mass occurs in older adults. In women, it occurs about the time of menopause. During this time, it  is important to practice good health habits, because if more bone is lost than what is replaced, the bones will become less healthy and more likely to break (fracture). If you find that you have a low bone mass, you may be able to prevent osteoporosis or further bone loss by changing your diet and lifestyle. HOW CAN I FIND OUT IF MY BONE MASS IS LOW? Bone mass can be measured with an X-ray test that is called a bone mineral density (BMD) test. This test is recommended for all women who are age 46 or older. It may also be recommended for men who are age 52 or older, or for people who are more likely to develop osteoporosis due to:  Having bones that break easily.  Having a long-term disease that weakens bones, such as kidney disease or rheumatoid arthritis.  Having menopause earlier than normal.  Taking medicine that weakens bones, such as steroids, thyroid hormones, or hormone treatment for breast cancer or prostate cancer.  Smoking.  Drinking three or more alcoholic drinks each day. WHAT ARE THE NUTRITIONAL RECOMMENDATIONS FOR HEALTHY BONES? To have healthy bones, you need to get enough of the right minerals and vitamins. Most nutrition experts recommend getting these nutrients from the foods that you eat. Nutritional recommendations vary from person to person. Ask your health care provider what is healthy for you. Here are some general guidelines. Calcium Recommendations Calcium is the most important (essential) mineral for bone health. Most people can get enough calcium from their diet, but supplements may be  recommended for people who are at risk for osteoporosis. Good sources of calcium include:  Dairy products, such as low-fat or nonfat milk, cheese, and yogurt.  Dark green leafy vegetables, such as bok choy and broccoli.  Calcium-fortified foods, such as orange juice, cereal, bread, soy beverages, and tofu products.  Nuts, such as almonds. Follow these recommended amounts for daily calcium intake:  Children, age 621-3: 700 mg.  Children, age 46-8: 1,000 mg.  Children, age 58-13: 1,300 mg.  Teens, age 46-18: 1,300 mg.  Adults, age 46-50: 1,000 mg.  Adults, age 29-70:  Men: 1,000 mg.  Women: 1,200 mg.  Adults, age 46 or older: 1,200 mg.  Pregnant and breastfeeding females:  Teens: 1,300 mg.  Adults: 1,000 mg. Vitamin D Recommendations Vitamin D is the most essential vitamin for bone health. It helps the body to absorb calcium. Sunlight stimulates the skin to make vitamin D, so be sure to get enough sunlight. If you live in a cold climate or you do not get outside often, your health care provider may recommend that you take vitamin D supplements. Good sources of vitamin D in your diet include:  Egg yolks.  Saltwater fish.  Milk and cereal fortified with vitamin D. Follow these recommended amounts for daily vitamin D intake:  Children and teens, age 621-18: 600 international units.  Adults, age 75 or younger: 400-800 international units.  Adults, age 66 or older: 800-1,000 international units. Other Nutrients Other nutrients for bone health include:  Phosphorus. This mineral is found in meat, poultry, dairy foods, nuts, and legumes. The recommended daily intake for adult men and adult women is 700 mg.  Magnesium. This mineral is found in seeds, nuts, dark green vegetables, and legumes. The recommended daily intake for adult men is 400-420 mg. For adult women, it is 310-320 mg.  Vitamin K. This vitamin is found in green leafy vegetables. The recommended daily intake is 120  mg for adult men  and 90 mg for adult women. WHAT TYPE OF PHYSICAL ACTIVITY IS BEST FOR BUILDING AND MAINTAINING HEALTHY BONES? Weight-bearing and strength-building activities are important for building and maintaining peak bone mass. Weight-bearing activities cause muscles and bones to work against gravity. Strength-building activities increases muscle strength that supports bones. Weight-bearing and muscle-building activities include:  Walking and hiking.  Jogging and running.  Dancing.  Gym exercises.  Lifting weights.  Tennis and racquetball.  Climbing stairs.  Aerobics. Adults should get at least 30 minutes of moderate physical activity on most days. Children should get at least 60 minutes of moderate physical activity on most days. Ask your health care provide what type of exercise is best for you. WHERE CAN I FIND MORE INFORMATION? For more information, check out the following websites:  [place]: YardHomes.se  [employer] [facility] [employer]: http://www.niams.AnonymousEar.fr.asp   This information is not intended to replace advice given to you by your health care provider. Make sure you discuss any questions you have with your health care provider.   Document Released: 06/01/2003 Document Revised: 07/26/2014 Document Reviewed: 03/16/2014 Elsevier Interactive Patient Education 2016 [place] K. Cordale Manera M.D.

## 2015-10-04 NOTE — Patient Instructions (Addendum)
Continue lifestyle intervention healthy eating and exercise . Get yearly blood test  cbcdiff  And thryoid and cholesterol and blood sugar as you are doing . Glad you are doing well .    Health Maintenance, Female Adopting a healthy lifestyle and getting preventive care can go a long way to promote health and wellness. Talk with your health care provider about what schedule of regular examinations is right for you. This is a good chance for you to check in with your provider about disease prevention and staying healthy. In between checkups, there are plenty of things you can do on your own. Experts have done a lot of research about which lifestyle changes and preventive measures are most likely to keep you healthy. Ask your health care provider for more information. WEIGHT AND DIET  Eat a healthy diet  Be sure to include plenty of vegetables, fruits, low-fat dairy products, and lean protein.  Do not eat a lot of foods high in solid fats, added sugars, or salt.  Get regular exercise. This is one of the most important things you can do for your health.  Most adults should exercise for at least 150 minutes each week. The exercise should increase your heart rate and make you sweat (moderate-intensity exercise).  Most adults should also do strengthening exercises at least twice a week. This is in addition to the moderate-intensity exercise.  Maintain a healthy weight  Body mass index (BMI) is a measurement that can be used to identify possible weight problems. It estimates body fat based on height and weight. Your health care provider can help determine your BMI and help you achieve or maintain a healthy weight.  For females 29 years of age and older:   A BMI below 18.5 is considered underweight.  A BMI of 18.5 to 24.9 is normal.  A BMI of 25 to 29.9 is considered overweight.  A BMI of 30 and above is considered obese.  Watch levels of cholesterol and blood lipids  You should start  having your blood tested for lipids and cholesterol at 46 years of age, then have this test every 5 years.  You may need to have your cholesterol levels checked more often if:  Your lipid or cholesterol levels are high.  You are older than 46 years of age.  You are at high risk for heart disease.  CANCER SCREENING   Lung Cancer  Lung cancer screening is recommended for adults 39-72 years old who are at high risk for lung cancer because of a history of smoking.  A yearly low-dose CT scan of the lungs is recommended for people who:  Currently smoke.  Have quit within the past 15 years.  Have at least a 30-pack-year history of smoking. A pack year is smoking an average of one pack of cigarettes a day for 1 year.  Yearly screening should continue until it has been 15 years since you quit.  Yearly screening should stop if you develop a health problem that would prevent you from having lung cancer treatment.  Breast Cancer  Practice breast self-awareness. This means understanding how your breasts normally appear and feel.  It also means doing regular breast self-exams. Let your health care provider know about any changes, no matter how small.  If you are in your 20s or 30s, you should have a clinical breast exam (CBE) by a health care provider every 1-3 years as part of a regular health exam.  If you are 40 or older,  have a CBE every year. Also consider having a breast X-ray (mammogram) every year.  If you have a family history of breast cancer, talk to your health care provider about genetic screening.  If you are at high risk for breast cancer, talk to your health care provider about having an MRI and a mammogram every year.  Breast cancer gene (BRCA) assessment is recommended for women who have family members with BRCA-related cancers. BRCA-related cancers include:  Breast.  Ovarian.  Tubal.  Peritoneal cancers.  Results of the assessment will determine the need for  genetic counseling and BRCA1 and BRCA2 testing. Cervical Cancer Your health care provider may recommend that you be screened regularly for cancer of the pelvic organs (ovaries, uterus, and vagina). This screening involves a pelvic examination, including checking for microscopic changes to the surface of your cervix (Pap test). You may be encouraged to have this screening done every 3 years, beginning at age 40.  For women ages 23-65, health care providers may recommend pelvic exams and Pap testing every 3 years, or they may recommend the Pap and pelvic exam, combined with testing for human papilloma virus (HPV), every 5 years. Some types of HPV increase your risk of cervical cancer. Testing for HPV may also be done on women of any age with unclear Pap test results.  Other health care providers may not recommend any screening for nonpregnant women who are considered low risk for pelvic cancer and who do not have symptoms. Ask your health care provider if a screening pelvic exam is right for you.  If you have had past treatment for cervical cancer or a condition that could lead to cancer, you need Pap tests and screening for cancer for at least 20 years after your treatment. If Pap tests have been discontinued, your risk factors (such as having a new sexual partner) need to be reassessed to determine if screening should resume. Some women have medical problems that increase the chance of getting cervical cancer. In these cases, your health care provider may recommend more frequent screening and Pap tests. Colorectal Cancer  This type of cancer can be detected and often prevented.  Routine colorectal cancer screening usually begins at 46 years of age and continues through 46 years of age.  Your health care provider may recommend screening at an earlier age if you have risk factors for colon cancer.  Your health care provider may also recommend using home test kits to check for hidden blood in the  stool.  A small camera at the end of a tube can be used to examine your colon directly (sigmoidoscopy or colonoscopy). This is done to check for the earliest forms of colorectal cancer.  Routine screening usually begins at age 49.  Direct examination of the colon should be repeated every 5-10 years through 46 years of age. However, you may need to be screened more often if early forms of precancerous polyps or small growths are found. Skin Cancer  Check your skin from head to toe regularly.  Tell your health care provider about any new moles or changes in moles, especially if there is a change in a mole's shape or color.  Also tell your health care provider if you have a mole that is larger than the size of a pencil eraser.  Always use sunscreen. Apply sunscreen liberally and repeatedly throughout the day.  Protect yourself by wearing long sleeves, pants, a wide-brimmed hat, and sunglasses whenever you are outside. HEART DISEASE, DIABETES, AND  HIGH BLOOD PRESSURE   High blood pressure causes heart disease and increases the risk of stroke. High blood pressure is more likely to develop in:  People who have blood pressure in the high end of the normal range (130-139/85-89 mm Hg).  People who are overweight or obese.  People who are African American.  If you are 82-58 years of age, have your blood pressure checked every 3-5 years. If you are 8 years of age or older, have your blood pressure checked every year. You should have your blood pressure measured twice--once when you are at a hospital or clinic, and once when you are not at a hospital or clinic. Record the average of the two measurements. To check your blood pressure when you are not at a hospital or clinic, you can use:  An automated blood pressure machine at a pharmacy.  A home blood pressure monitor.  If you are between 75 years and 23 years old, ask your health care provider if you should take aspirin to prevent  strokes.  Have regular diabetes screenings. This involves taking a blood sample to check your fasting blood sugar level.  If you are at a normal weight and have a low risk for diabetes, have this test once every three years after 46 years of age.  If you are overweight and have a high risk for diabetes, consider being tested at a younger age or more often. PREVENTING INFECTION  Hepatitis B  If you have a higher risk for hepatitis B, you should be screened for this virus. You are considered at high risk for hepatitis B if:  You were born in a country where hepatitis B is common. Ask your health care provider which countries are considered high risk.  Your parents were born in a high-risk country, and you have not been immunized against hepatitis B (hepatitis B vaccine).  You have HIV or AIDS.  You use needles to inject street drugs.  You live with someone who has hepatitis B.  You have had sex with someone who has hepatitis B.  You get hemodialysis treatment.  You take certain medicines for conditions, including cancer, organ transplantation, and autoimmune conditions. Hepatitis C  Blood testing is recommended for:  Everyone born from [birth year] through 1966.  Anyone with known risk factors for hepatitis C. Sexually transmitted infections (STIs)  You should be screened for sexually transmitted infections (STIs) including gonorrhea and chlamydia if:  You are sexually active and are younger than 46 years of age.  You are older than 46 years of age and your health care provider tells you that you are at risk for this type of infection.  Your sexual activity has changed since you were last screened and you are at an increased risk for chlamydia or gonorrhea. Ask your health care provider if you are at risk.  If you do not have HIV, but are at risk, it may be recommended that you take a prescription medicine daily to prevent HIV infection. This is called pre-exposure prophylaxis  (PrEP). You are considered at risk if:  You are sexually active and do not regularly use condoms or know the HIV status of your partner(s).  You take drugs by injection.  You are sexually active with a partner who has HIV. Talk with your health care provider about whether you are at high risk of being infected with HIV. If you choose to begin PrEP, you should first be tested for HIV. You should then be tested  every 3 months for as long as you are taking PrEP.  PREGNANCY   If you are premenopausal and you may become pregnant, ask your health care provider about preconception counseling.  If you may become pregnant, take 400 to 800 micrograms (mcg) of folic acid every day.  If you want to prevent pregnancy, talk to your health care provider about birth control (contraception). OSTEOPOROSIS AND MENOPAUSE   Osteoporosis is a disease in which the bones lose minerals and strength with aging. This can result in serious bone fractures. Your risk for osteoporosis can be identified using a bone density scan.  If you are 42 years of age or older, or if you are at risk for osteoporosis and fractures, ask your health care provider if you should be screened.  Ask your health care provider whether you should take a calcium or vitamin D supplement to lower your risk for osteoporosis.  Menopause may have certain physical symptoms and risks.  Hormone replacement therapy may reduce some of these symptoms and risks. Talk to your health care provider about whether hormone replacement therapy is right for you.  HOME CARE INSTRUCTIONS   Schedule regular health, dental, and eye exams.  Stay current with your immunizations.   Do not use any tobacco products including cigarettes, chewing tobacco, or electronic cigarettes.  If you are pregnant, do not drink alcohol.  If you are breastfeeding, limit how much and how often you drink alcohol.  Limit alcohol intake to no more than 1 drink per day for  nonpregnant women. One drink equals 12 ounces of beer, 5 ounces of wine, or 1 ounces of hard liquor.  Do not use street drugs.  Do not share needles.  Ask your health care provider for help if you need support or information about quitting drugs.  Tell your health care provider if you often feel depressed.  Tell your health care provider if you have ever been abused or do not feel safe at home.   This information is not intended to replace advice given to you by your health care provider. Make sure you discuss any questions you have with your health care provider.   Document Released: 09/24/2010 Document Revised: 04/01/2014 Document Reviewed: 02/10/2013 Elsevier Interactive Patient Education 2016 [place] protect organs, store calcium, and anchor muscles. Good health habits, such as eating nutritious foods and exercising regularly, are important for maintaining healthy bones. They can also help to prevent a condition that causes bones to lose density and become weak and brittle (osteoporosis). WHY IS BONE MASS IMPORTANT? Bone mass refers to the amount of bone tissue that you have. The higher your bone mass, the stronger your bones. An important step toward having healthy bones throughout life is to have strong and dense bones during childhood. A young adult who has a high bone mass is more likely to have a high bone mass later in life. Bone mass at its greatest it is called peak bone mass. A large decline in bone mass occurs in older adults. In women, it occurs about the time of menopause. During this time, it is important to practice good health habits, because if more bone is lost than what is replaced, the bones will become less healthy and more likely to break (fracture). If you find that you have a low bone mass, you may be able to prevent osteoporosis or further bone loss by changing your diet and lifestyle. HOW CAN I FIND OUT IF MY BONE  MASS IS LOW? Bone mass  can be measured with an X-ray test that is called a bone mineral density (BMD) test. This test is recommended for all women who are age 3 or older. It may also be recommended for men who are age 686 or older, or for people who are more likely to develop osteoporosis due to:  Having bones that break easily.  Having a long-term disease that weakens bones, such as kidney disease or rheumatoid arthritis.  Having menopause earlier than normal.  Taking medicine that weakens bones, such as steroids, thyroid hormones, or hormone treatment for breast cancer or prostate cancer.  Smoking.  Drinking three or more alcoholic drinks each day. WHAT ARE THE NUTRITIONAL RECOMMENDATIONS FOR HEALTHY BONES? To have healthy bones, you need to get enough of the right minerals and vitamins. Most nutrition experts recommend getting these nutrients from the foods that you eat. Nutritional recommendations vary from person to person. Ask your health care provider what is healthy for you. Here are some general guidelines. Calcium Recommendations Calcium is the most important (essential) mineral for bone health. Most people can get enough calcium from their diet, but supplements may be recommended for people who are at risk for osteoporosis. Good sources of calcium include:  Dairy products, such as low-fat or nonfat milk, cheese, and yogurt.  Dark green leafy vegetables, such as bok choy and broccoli.  Calcium-fortified foods, such as orange juice, cereal, bread, soy beverages, and tofu products.  Nuts, such as almonds. Follow these recommended amounts for daily calcium intake:  Children, age 607-3: 700 mg.  Children, age 68-8: 1,000 mg.  Children, age 72-13: 1,300 mg.  Teens, age 54-18: 1,300 mg.  Adults, age 80-50: 1,000 mg.  Adults, age 76-70:  Men: 1,000 mg.  Women: 1,200 mg.  Adults, age 89 or older: 1,200 mg.  Pregnant and breastfeeding females:  Teens: 1,300 mg.  Adults: 1,000 mg. Vitamin D  Recommendations Vitamin D is the most essential vitamin for bone health. It helps the body to absorb calcium. Sunlight stimulates the skin to make vitamin D, so be sure to get enough sunlight. If you live in a cold climate or you do not get outside often, your health care provider may recommend that you take vitamin D supplements. Good sources of vitamin D in your diet include:  Egg yolks.  Saltwater fish.  Milk and cereal fortified with vitamin D. Follow these recommended amounts for daily vitamin D intake:  Children and teens, age 607-18: 600 international units.  Adults, age 71 or younger: 400-800 international units.  Adults, age 2 or older: 800-1,000 international units. Other Nutrients Other nutrients for bone health include:  Phosphorus. This mineral is found in meat, poultry, dairy foods, nuts, and legumes. The recommended daily intake for adult men and adult women is 700 mg.  Magnesium. This mineral is found in seeds, nuts, dark green vegetables, and legumes. The recommended daily intake for adult men is 400-420 mg. For adult women, it is 310-320 mg.  Vitamin K. This vitamin is found in green leafy vegetables. The recommended daily intake is 120 mg for adult men and 90 mg for adult women. WHAT TYPE OF PHYSICAL ACTIVITY IS BEST FOR BUILDING AND MAINTAINING HEALTHY BONES? Weight-bearing and strength-building activities are important for building and maintaining peak bone mass. Weight-bearing activities cause muscles and bones to work against gravity. Strength-building activities increases muscle strength that supports bones. Weight-bearing and muscle-building activities include:  Walking and hiking.  Jogging and running.  Dancing.  Gym exercises.  Lifting weights.  Tennis and racquetball.  Climbing stairs.  Aerobics. Adults should get at least 30 minutes of moderate physical activity on most days. Children should get at least 60 minutes of moderate physical activity on  most days. Ask your health care provide what type of exercise is best for you. WHERE CAN I FIND MORE INFORMATION? For more information, check out the following websites:  [place]: YardH[employer].se  [employer] of [employer]: http://www.niams.AnonymousEar.fr.asp   This information is not intended to replace advice given to you by your health care provider. Make sure you discuss any questions you have with your health care provider.   Document Released: 06/01/2003 Document Revised: 07/26/2014 Document Reviewed: 03/16/2014 Elsevier Interactive Patient Education [employer].

## 2015-11-26 ENCOUNTER — Other Ambulatory Visit: Payer: Self-pay | Admitting: Internal Medicine

## 2016-05-13 ENCOUNTER — Other Ambulatory Visit: Payer: Self-pay | Admitting: Internal Medicine

## 2016-05-16 NOTE — Telephone Encounter (Signed)
Sent to the pharmacy by e-scribe for 6 months.  Pt has upcoming cpx on 10/07/16

## 2016-06-11 NOTE — Progress Notes (Signed)
Chief Complaint  Patient presents with  . Panic Attack    started a month ago   . Weight Gain    pt noticed that she started gaining weight in feb     HPI: Karen Fleming 46 y.o. come in for concerns panic attacks and wegiht gain  About 1 month ago noted  Panic attacks re turing   She is on sertraline 50 mg per day. No trigger   Lasts less than 10 minutes and calms self down described as  acute scary feeling  Then Fast heart rate   And  Shaking.  About    Minutes   .      Also noted Swell at night  .   Have to take rings off.  And  weight has gone up.  A good bit in a year    Diet the same    Healthy diet.    runs  30 minutes at home.  healthy eating  About  6- 9 month  Noted.    2 periods   Last month .   Otherwise nl / No hot flushes but sweats a a lot at night  No specific  Issues to trigger but thought to get checked out.  skin  rough patches  No other rash or hair change s  ROS: See pertinent positives and negatives per HPI. Neg cp sob  X panic  Some awakening with dreams in sleep . No gi changes  Thyroid  Taking same meds  As well as lipid med  fam hx of dm   Past Medical History:  Diagnosis Date  . HELLP (hemolytic anemia/elev liver enzymes/low platelets in pregnancy)    c-section 2007  . Hyperlipidemia   . Hypothyroidism   . Pancreatitis    2000 and 2005  . Panic attacks    on meds pre pregnancy/hx    Family History  Problem Relation Age of Onset  . Heart attack Father     in 48s   . Hyperlipidemia Father   . Diabetes      gm and gf  . Hypertension Father     gm and gf  . Arthritis      gp ? OA  . Hyperlipidemia Mother   . Colon cancer Neg Hx   . Stomach cancer Neg Hx   . CVA Father     arrynthmia ? af  age 64   . Throat cancer Father     Social History   Social History  . Marital status: Married    Spouse name: N/A  . Number of children: N/A  . Years of education: N/A   Social History Main Topics  . Smoking status: Never Smoker  .  Smokeless tobacco: Never Used  . Alcohol use No  . Drug use: No  . Sexual activity: Not Asked   Other Topics Concern  . None   Social History Narrative   HH of 3 pet  Dog     76 yo son child at home [place]   No ets   No caffeine   Drink milk 2-3 x per day    reg exercise    [place]             Outpatient Medications Prior to Visit  Medication Sig Dispense Refill  . atorvastatin (LIPITOR) 10 MG tablet TAKE 1 TABLET BY MOUTH  DAILY 90 tablet 1  . levothyroxine (SYNTHROID, LEVOTHROID) 50 MCG tablet TAKE 1 TABLET BY MOUTH  DAILY 90 tablet 1  . LINZESS 290 MCG CAPS capsule Take 290 mcg by mouth daily as needed (constipation).   2  . sertraline (ZOLOFT) 50 MG tablet TAKE 1 TABLET BY MOUTH  DAILY 90 tablet 1   No facility-administered medications prior to visit.      EXAM:  BP 110/72 (BP Location: Right Arm, Patient Position: Sitting, Cuff Size: Normal)   Pulse 73   Temp 98.5 F (36.9 C) (Oral)   Ht 5' 1.75" (1.568 m)   Wt 167 lb 12.8 oz (76.1 kg)   LMP 04/29/2016   BMI 30.94 kg/m   Body mass index is 30.94 kg/m.  GENERAL: vitals reviewed and listed above, alert, oriented, appears well hydrated and in no acute distress face looks lsightly cushinoid  HEENT: atraumatic, conjunctiva  clear, no obvious abnormalities on inspection of external nose and ears OP : no lesion edema or exudate  NECK: no obvious masses on inspection palpation  LUNGS: clear to auscultation bilaterally, no wheezes, rales or rhonchi, good air movement CV: HRRR, no clubbing cyanosis or  peripheral edema nl cap refill  Abdomen:  Sof,t normal bowel sounds without hepatosplenomegaly, no guarding rebound or masses no CVA tenderness MS: moves all extremities without noticeable focal  Abnormality Skin no stria  Some rough patches    PSYCH: pleasant and cooperative, no obvious depression or anxiety Lab Results  Component Value Date   WBC 11.4 (H) 09/13/2015   HGB 12.4 09/13/2015   HCT 37.3  09/13/2015   PLT 350.0 09/13/2015   GLUCOSE 100 (H) 09/13/2015   CHOL 186 09/13/2015   TRIG 83.0 09/13/2015   HDL 46.10 09/13/2015   LDLDIRECT 173.7 02/20/2011   LDLCALC 123 (H) 09/13/2015   ALT 13 09/13/2015   AST 16 09/13/2015   NA 139 09/13/2015   K 4.2 09/13/2015   CL 104 09/13/2015   CREATININE 0.67 09/13/2015   BUN 13 09/13/2015   CO2 27 09/13/2015   TSH 1.58 09/13/2015   BP Readings from Last 3 Encounters:  06/12/16 110/72  10/04/15 126/80  05/31/15 126/74   Wt Readings from Last 3 Encounters:  06/12/16 167 lb 12.8 oz (76.1 kg)  10/04/15 151 lb 9.6 oz (68.8 kg)  09/06/14 146 lb 14.4 oz (66.6 kg)    ASSESSMENT AND PLAN:  Discussed the following assessment and plan:  PANIC DISORDER - Plan: Basic metabolic panel, CBC with Differential/Platelet, Hemoglobin A1c, Hepatic function panel, Lipid panel, TSH, T4, free, Insulin, fasting, Cortisol-am, blood  Excessive body weight gain - Plan: Basic metabolic panel, CBC with Differential/Platelet, Hemoglobin A1c, Hepatic function panel, Lipid panel, TSH, T4, free, Insulin, fasting, Cortisol-am, blood  Family history of diabetes mellitus (DM) - Plan: Basic metabolic panel, CBC with Differential/Platelet, Hemoglobin A1c, Hepatic function panel, Lipid panel, TSH, T4, free, Insulin, fasting, Cortisol-am, blood  Hyperlipidemia, unspecified hyperlipidemia type - Plan: Basic metabolic panel, CBC with Differential/Platelet, Hemoglobin A1c, Hepatic function panel, Lipid panel, TSH, T4, free, Insulin, fasting, Cortisol-am, blood  Hypothyroidism, unspecified type - Plan: Basic metabolic panel, CBC with Differential/Platelet, Hemoglobin A1c, Hepatic function panel, Lipid panel, TSH, T4, free, Insulin, fasting, Cortisol-am, blood She has had the panic situation before his aware of the recurrence. No obvious cause. For now increase sertraline to 100 mg a day. In regard to weight gain it does seem rather rapid for her and I suppose her thyroid  could be off and perhaps the sertraline and issue however reading hasn't changed and she has been exercising. She does have a  family history of diabetes. check insulin levels  Suppose  [place] could be associate but has been on this med for a while without problem.   Swelling ? hormonal influence?    -Patient advised to return or notify health care team  if  new concerns arise.  Patient Instructions  Make appointment for fasting lab work and I will put in the orders will include blood sugar thyroid cortisol and insulin levels. Because the panic attacks are increasing at this time would increase the sertraline to 100 mg a day. Continue to pay attention to salt content of your food carbs simple carbohydrates that can help you gain weight and raise blood sugar. Plan follow-up visit in 3-4 weeks and will monitor the panic attacks and look for metabolic factors.   Sometimes hormonal changes can cause fluid shifts that could be causing the swelling. Your heart sounds normal  On exam today    Panic Attacks Panic attacks are sudden, short-livedsurges of severe anxiety, fear, or discomfort. They may occur for no reason when you are relaxed, when you are anxious, or when you are sleeping. Panic attacks may occur for a number of reasons:  Healthy people occasionally have panic attacks in extreme, life-threatening situations, such as war or natural disasters. Normal anxiety is a protective mechanism of the body that helps [place] react to danger (fight or flight response).  Panic attacks are often seen with anxiety disorders, such as panic disorder, social anxiety disorder, generalized anxiety disorder, and phobias. Anxiety disorders cause excessive or uncontrollable anxiety. They may interfere with your relationships or other life activities.  Panic attacks are sometimes seen with other mental illnesses, such as depression and posttraumatic stress disorder.  Certain medical conditions, prescription medicines,  and drugs of abuse can cause panic attacks. What are the signs or symptoms? Panic attacks start suddenly, peak within 20 minutes, and are accompanied by four or more of the following symptoms:  Pounding heart or fast heart rate (palpitations).  Sweating.  Trembling or shaking.  Shortness of breath or feeling smothered.  Feeling choked.  Chest pain or discomfort.  Nausea or strange feeling in your stomach.  Dizziness, light-headedness, or feeling like you will faint.  Chills or hot flushes.  Numbness or tingling in your lips or hands and feet.  Feeling that things are not real or feeling that you are not yourself.  Fear of losing control or going crazy.  Fear of dying. Some of these symptoms can mimic serious medical conditions. For example, you may think you are having a heart attack. Although panic attacks can be very scary, they are not life threatening. How is this diagnosed? Panic attacks are diagnosed through an assessment by your health care provider. Your health care provider will ask questions about your symptoms, such as where and when they occurred. Your health care provider will also ask about your medical history and use of alcohol and drugs, including prescription medicines. Your health care provider may order blood tests or other studies to rule out a serious medical condition. Your health care provider may refer you to a mental health professional for further evaluation. How is this treated?  Most healthy people who have one or two panic attacks in an extreme, life-threatening situation will not require treatment.  The treatment for panic attacks associated with anxiety disorders or other mental illness typically involves counseling with a mental health professional, medicine, or a combination of both. Your health care provider will help determine what treatment is best  for you.  Panic attacks due to physical illness usually go away with treatment of the illness.  If prescription medicine is causing panic attacks, talk with your health care provider about stopping the medicine, decreasing the dose, or substituting another medicine.  Panic attacks due to alcohol or drug abuse go away with abstinence. Some adults need professional help in order to stop drinking or using drugs. Follow these instructions at home:  Take all medicines as directed by your health care provider.  Schedule and attend follow-up visits as directed by your health care provider. It is important to keep all your appointments. Contact a health care provider if:  You are not able to take your medicines as prescribed.  Your symptoms do not improve or get worse. Get help right away if:  You experience panic attack symptoms that are different than your usual symptoms.  You have serious thoughts about hurting yourself or others.  You are taking medicine for panic attacks and have a serious side effect. This information is not intended to replace advice given to you by your health care provider. Make sure you discuss any questions you have with your health care provider. Document Released: 03/11/2005 Document Revised: 08/17/2015 Document Reviewed: 10/23/2012 Elsevier Interactive Patient Education  2017 Elsevier Inc.    Low-Sodium Eating Plan Sodium, which is an element that makes up salt, helps you maintain a healthy balance of fluids in your body. Too much sodium can increase your blood pressure and cause fluid and waste to be held in your body. Your health care provider or dietitian may recommend following this plan if you have high blood pressure (hypertension), kidney disease, liver disease, or heart failure. Eating less sodium can help lower your blood pressure, reduce swelling, and protect your heart, liver, and kidneys. What are tips for following this plan? General guidelines   Most people on this plan should limit their sodium intake to 1,500-2,000 mg (milligrams) of  sodium each day. Reading food labels   The Nutrition Facts label lists the amount of sodium in one serving of the food. If you eat more than one serving, you must multiply the listed amount of sodium by the number of servings.  Choose foods with less than 140 mg of sodium per serving.  Avoid foods with 300 mg of sodium or more per serving. Shopping   Look for lower-sodium products, often labeled as "low-sodium" or "no salt added."  Always check the sodium content even if foods are labeled as "unsalted" or "no salt added".  Buy fresh foods.  Avoid canned foods and premade or frozen meals.  Avoid canned, cured, or processed meats  Buy breads that have less than 80 mg of sodium per slice. Cooking   Eat more home-cooked food and less restaurant, buffet, and fast food.  Avoid adding salt when cooking. Use salt-free seasonings or herbs instead of table salt or sea salt. Check with your health care provider or pharmacist before using salt substitutes.  Cook with plant-based oils, such as canola, sunflower, or olive oil. Meal planning   When eating at a restaurant, ask that your food be prepared with less salt or no salt, if possible.  Avoid foods that contain MSG (monosodium glutamate). MSG is sometimes added to [place] food, bouillon, and some canned foods. What foods are recommended? The items listed may not be a complete list. Talk with your dietitian about what dietary choices are best for you. Grains  Low-sodium cereals, including oats, puffed wheat and rice,  and shredded wheat. Low-sodium crackers. Unsalted rice. Unsalted pasta. Low-sodium bread. Whole-grain breads and whole-grain pasta. Vegetables  Fresh or frozen vegetables. "No salt added" canned vegetables. "No salt added" tomato sauce and paste. Low-sodium or reduced-sodium tomato and vegetable juice. Fruits  Fresh, frozen, or canned fruit. Fruit juice. Meats and other protein foods  Fresh or frozen (no salt added) meat,  poultry, seafood, and fish. Low-sodium canned tuna and salmon. Unsalted nuts. Dried peas, beans, and lentils without added salt. Unsalted canned beans. Eggs. Unsalted nut butters. Dairy  Milk. Soy milk. Cheese that is naturally low in sodium, such as ricotta cheese, fresh mozzarella, or Swiss cheese Low-sodium or reduced-sodium cheese. Cream cheese. Yogurt. Fats and oils  Unsalted butter. Unsalted margarine with no trans fat. Vegetable oils such as canola or olive oils. Seasonings and other foods  Fresh and dried herbs and spices. Salt-free seasonings. Low-sodium mustard and ketchup. Sodium-free salad dressing. Sodium-free light mayonnaise. Fresh or refrigerated horseradish. Lemon juice. Vinegar. Homemade, reduced-sodium, or low-sodium soups. Unsalted popcorn and pretzels. Low-salt or salt-free chips. What foods are not recommended? The items listed may not be a complete list. Talk with your dietitian about what dietary choices are best for you. Grains  Instant hot cereals. Bread stuffing, pancake, and biscuit mixes. Croutons. Seasoned rice or pasta mixes. Noodle soup cups. Boxed or frozen macaroni and cheese. Regular salted crackers. Self-rising flour. Vegetables  Sauerkraut, pickled vegetables, and relishes. Olives. [place] fries. Onion rings. Regular canned vegetables (not low-sodium or reduced-sodium). Regular canned tomato sauce and paste (not low-sodium or reduced-sodium). Regular tomato and vegetable juice (not low-sodium or reduced-sodium). Frozen vegetables in sauces. Meats and other protein foods  Meat or fish that is salted, canned, smoked, spiced, or pickled. Bacon, ham, sausage, hotdogs, corned beef, chipped beef, packaged lunch meats, salt pork, jerky, pickled herring, anchovies, regular canned tuna, sardines, salted nuts. Dairy  Processed cheese and cheese spreads. Cheese curds. Blue cheese. Feta cheese. String cheese. Regular cottage cheese. Buttermilk. Canned milk. Fats and oils    Salted butter. Regular margarine. Ghee. Bacon fat. Seasonings and other foods  Onion salt, garlic salt, seasoned salt, table salt, and sea salt. Canned and packaged gravies. Worcestershire sauce. Tartar sauce. Barbecue sauce. Teriyaki sauce. Soy sauce, including reduced-sodium. Steak sauce. Fish sauce. Oyster sauce. Cocktail sauce. Horseradish that you find on the shelf. Regular ketchup and mustard. Meat flavorings and tenderizers. Bouillon cubes. Hot sauce and Tabasco sauce. Premade or packaged marinades. Premade or packaged taco seasonings. Relishes. Regular salad dressings. Salsa. Potato and tortilla chips. Corn chips and puffs. Salted popcorn and pretzels. Canned or dried soups. Pizza. Frozen entrees and pot pies. Summary  Eating less sodium can help lower your blood pressure, reduce swelling, and protect your heart, liver, and kidneys.  Most people on this plan should limit their sodium intake to 1,500-2,000 mg (milligrams) of sodium each day.  Canned, boxed, and frozen foods are high in sodium. Restaurant foods, fast foods, and pizza are also very high in sodium. You also get sodium by adding salt to food.  Try to cook at home, eat more fresh fruits and vegetables, and eat less fast food, canned, processed, or prepared foods. This information is not intended to replace advice given to you by your health care provider. Make sure you discuss any questions you have with your health care provider. Document Released: 08/31/2001 Document Revised: 03/04/2016 Document Reviewed: 03/04/2016 Elsevier Interactive Patient Education  2017 [employer]      [place] River CenterWanda K. Panosh M.D.

## 2016-06-12 ENCOUNTER — Ambulatory Visit (INDEPENDENT_AMBULATORY_CARE_PROVIDER_SITE_OTHER): Payer: 59 | Admitting: Internal Medicine

## 2016-06-12 ENCOUNTER — Encounter: Payer: Self-pay | Admitting: Internal Medicine

## 2016-06-12 VITALS — BP 110/72 | HR 73 | Temp 98.5°F | Ht 61.75 in | Wt 167.8 lb

## 2016-06-12 DIAGNOSIS — E785 Hyperlipidemia, unspecified: Secondary | ICD-10-CM | POA: Diagnosis not present

## 2016-06-12 DIAGNOSIS — E039 Hypothyroidism, unspecified: Secondary | ICD-10-CM

## 2016-06-12 DIAGNOSIS — R635 Abnormal weight gain: Secondary | ICD-10-CM | POA: Diagnosis not present

## 2016-06-12 DIAGNOSIS — Z833 Family history of diabetes mellitus: Secondary | ICD-10-CM

## 2016-06-12 DIAGNOSIS — F410 Panic disorder [episodic paroxysmal anxiety] without agoraphobia: Secondary | ICD-10-CM | POA: Diagnosis not present

## 2016-06-12 MED ORDER — SERTRALINE HCL 50 MG PO TABS: 100.0000 mg | ORAL_TABLET | Freq: Every day | ORAL | 1 refills | Status: DC

## 2016-06-12 NOTE — Patient Instructions (Addendum)
Make appointment for fasting lab work and I will put in the orders will include blood sugar thyroid cortisol and insulin levels. Because the panic attacks are increasing at this time would increase the sertraline to 100 mg a day. Continue to pay attention to salt content of your food carbs simple carbohydrates that can help you gain weight and raise blood sugar. Plan follow-up visit in 3-4 weeks and will monitor the panic attacks and look for metabolic factors.   Sometimes hormonal changes can cause fluid shifts that could be causing the swelling. Your heart sounds normal  On exam today    Panic Attacks Panic attacks are sudden, short-livedsurges of severe anxiety, fear, or discomfort. They may occur for no reason when you are relaxed, when you are anxious, or when you are sleeping. Panic attacks may occur for a number of reasons:  Healthy people occasionally have panic attacks in extreme, life-threatening situations, such as war or natural disasters. Normal anxiety is a protective mechanism of the body that helps [place] react to danger (fight or flight response).  Panic attacks are often seen with anxiety disorders, such as panic disorder, social anxiety disorder, generalized anxiety disorder, and phobias. Anxiety disorders cause excessive or uncontrollable anxiety. They may interfere with your relationships or other life activities.  Panic attacks are sometimes seen with other mental illnesses, such as depression and posttraumatic stress disorder.  Certain medical conditions, prescription medicines, and drugs of abuse can cause panic attacks. What are the signs or symptoms? Panic attacks start suddenly, peak within 20 minutes, and are accompanied by four or more of the following symptoms:  Pounding heart or fast heart rate (palpitations).  Sweating.  Trembling or shaking.  Shortness of breath or feeling smothered.  Feeling choked.  Chest pain or discomfort.  Nausea or strange  feeling in your stomach.  Dizziness, light-headedness, or feeling like you will faint.  Chills or hot flushes.  Numbness or tingling in your lips or hands and feet.  Feeling that things are not real or feeling that you are not yourself.  Fear of losing control or going crazy.  Fear of dying. Some of these symptoms can mimic serious medical conditions. For example, you may think you are having a heart attack. Although panic attacks can be very scary, they are not life threatening. How is this diagnosed? Panic attacks are diagnosed through an assessment by your health care provider. Your health care provider will ask questions about your symptoms, such as where and when they occurred. Your health care provider will also ask about your medical history and use of alcohol and drugs, including prescription medicines. Your health care provider may order blood tests or other studies to rule out a serious medical condition. Your health care provider may refer you to a mental health professional for further evaluation. How is this treated?  Most healthy people who have one or two panic attacks in an extreme, life-threatening situation will not require treatment.  The treatment for panic attacks associated with anxiety disorders or other mental illness typically involves counseling with a mental health professional, medicine, or a combination of both. Your health care provider will help determine what treatment is best for you.  Panic attacks due to physical illness usually go away with treatment of the illness. If prescription medicine is causing panic attacks, talk with your health care provider about stopping the medicine, decreasing the dose, or substituting another medicine.  Panic attacks due to alcohol or drug abuse go away with  abstinence. Some adults need professional help in order to stop drinking or using drugs. Follow these instructions at home:  Take all medicines as directed by your  health care provider.  Schedule and attend follow-up visits as directed by your health care provider. It is important to keep all your appointments. Contact a health care provider if:  You are not able to take your medicines as prescribed.  Your symptoms do not improve or get worse. Get help right away if:  You experience panic attack symptoms that are different than your usual symptoms.  You have serious thoughts about hurting yourself or others.  You are taking medicine for panic attacks and have a serious side effect. This information is not intended to replace advice given to you by your health care provider. Make sure you discuss any questions you have with your health care provider. Document Released: 03/11/2005 Document Revised: 08/17/2015 Document Reviewed: 10/23/2012 Elsevier Interactive Patient Education  2017 [employer]    Low-Sodium Eating Plan Sodium, which is an element that makes up salt, helps you maintain a healthy balance of fluids in your body. Too much sodium can increase your blood pressure and cause fluid and waste to be held in your body. Your health care provider or dietitian may recommend following this plan if you have high blood pressure (hypertension), kidney disease, liver disease, or heart failure. Eating less sodium can help lower your blood pressure, reduce swelling, and protect your heart, liver, and kidneys. What are tips for following this plan? General guidelines   Most people on this plan should limit their sodium intake to 1,500-2,000 mg (milligrams) of sodium each day. Reading food labels   The Nutrition Facts label lists the amount of sodium in one serving of the food. If you eat more than one serving, you must multiply the listed amount of sodium by the number of servings.  Choose foods with less than 140 mg of sodium per serving.  Avoid foods with 300 mg of sodium or more per serving. Shopping   Look for lower-sodium products, often  labeled as "low-sodium" or "no salt added."  Always check the sodium content even if foods are labeled as "unsalted" or "no salt added".  Buy fresh foods.  Avoid canned foods and premade or frozen meals.  Avoid canned, cured, or processed meats  Buy breads that have less than 80 mg of sodium per slice. Cooking   Eat more home-cooked food and less restaurant, buffet, and fast food.  Avoid adding salt when cooking. Use salt-free seasonings or herbs instead of table salt or sea salt. Check with your health care provider or pharmacist before using salt substitutes.  Cook with plant-based oils, such as canola, sunflower, or olive oil. Meal planning   When eating at a restaurant, ask that your food be prepared with less salt or no salt, if possible.  Avoid foods that contain MSG (monosodium glutamate). MSG is sometimes added to [place] food, bouillon, and some canned foods. What foods are recommended? The items listed may not be a complete list. Talk with your dietitian about what dietary choices are best for you. Grains  Low-sodium cereals, including oats, puffed wheat and rice, and shredded wheat. Low-sodium crackers. Unsalted rice. Unsalted pasta. Low-sodium bread. Whole-grain breads and whole-grain pasta. Vegetables  Fresh or frozen vegetables. "No salt added" canned vegetables. "No salt added" tomato sauce and paste. Low-sodium or reduced-sodium tomato and vegetable juice. Fruits  Fresh, frozen, or canned fruit. Fruit juice. Meats and other  protein foods  Fresh or frozen (no salt added) meat, poultry, seafood, and fish. Low-sodium canned tuna and salmon. Unsalted nuts. Dried peas, beans, and lentils without added salt. Unsalted canned beans. Eggs. Unsalted nut butters. Dairy  Milk. Soy milk. Cheese that is naturally low in sodium, such as ricotta cheese, fresh mozzarella, or Swiss cheese Low-sodium or reduced-sodium cheese. Cream cheese. Yogurt. Fats and oils  Unsalted butter.  Unsalted margarine with no trans fat. Vegetable oils such as canola or olive oils. Seasonings and other foods  Fresh and dried herbs and spices. Salt-free seasonings. Low-sodium mustard and ketchup. Sodium-free salad dressing. Sodium-free light mayonnaise. Fresh or refrigerated horseradish. Lemon juice. Vinegar. Homemade, reduced-sodium, or low-sodium soups. Unsalted popcorn and pretzels. Low-salt or salt-free chips. What foods are not recommended? The items listed may not be a complete list. Talk with your dietitian about what dietary choices are best for you. Grains  Instant hot cereals. Bread stuffing, pancake, and biscuit mixes. Croutons. Seasoned rice or pasta mixes. Noodle soup cups. Boxed or frozen macaroni and cheese. Regular salted crackers. Self-rising flour. Vegetables  Sauerkraut, pickled vegetables, and relishes. Olives. [place] fries. Onion rings. Regular canned vegetables (not low-sodium or reduced-sodium). Regular canned tomato sauce and paste (not low-sodium or reduced-sodium). Regular tomato and vegetable juice (not low-sodium or reduced-sodium). Frozen vegetables in sauces. Meats and other protein foods  Meat or fish that is salted, canned, smoked, spiced, or pickled. Bacon, ham, sausage, hotdogs, corned beef, chipped beef, packaged lunch meats, salt pork, jerky, pickled herring, anchovies, regular canned tuna, sardines, salted nuts. Dairy  Processed cheese and cheese spreads. Cheese curds. Blue cheese. Feta cheese. String cheese. Regular cottage cheese. Buttermilk. Canned milk. Fats and oils  Salted butter. Regular margarine. Ghee. Bacon fat. Seasonings and other foods  Onion salt, garlic salt, seasoned salt, table salt, and sea salt. Canned and packaged gravies. Worcestershire sauce. Tartar sauce. Barbecue sauce. Teriyaki sauce. Soy sauce, including reduced-sodium. Steak sauce. Fish sauce. Oyster sauce. Cocktail sauce. Horseradish that you find on the shelf. Regular ketchup and  mustard. Meat flavorings and tenderizers. Bouillon cubes. Hot sauce and Tabasco sauce. Premade or packaged marinades. Premade or packaged taco seasonings. Relishes. Regular salad dressings. Salsa. Potato and tortilla chips. Corn chips and puffs. Salted popcorn and pretzels. Canned or dried soups. Pizza. Frozen entrees and pot pies. Summary  Eating less sodium can help lower your blood pressure, reduce swelling, and protect your heart, liver, and kidneys.  Most people on this plan should limit their sodium intake to 1,500-2,000 mg (milligrams) of sodium each day.  Canned, boxed, and frozen foods are high in sodium. Restaurant foods, fast foods, and pizza are also very high in sodium. You also get sodium by adding salt to food.  Try to cook at home, eat more fresh fruits and vegetables, and eat less fast food, canned, processed, or prepared foods. This information is not intended to replace advice given to you by your health care provider. Make sure you discuss any questions you have with your health care provider. Document Released: 08/31/2001 Document Revised: 03/04/2016 Document Reviewed: 03/04/2016 Elsevier Interactive Patient Education  2017 [employer].

## 2016-06-13 ENCOUNTER — Other Ambulatory Visit (INDEPENDENT_AMBULATORY_CARE_PROVIDER_SITE_OTHER): Payer: 59

## 2016-06-13 DIAGNOSIS — E039 Hypothyroidism, unspecified: Secondary | ICD-10-CM

## 2016-06-13 DIAGNOSIS — E785 Hyperlipidemia, unspecified: Secondary | ICD-10-CM | POA: Diagnosis not present

## 2016-06-13 DIAGNOSIS — F410 Panic disorder [episodic paroxysmal anxiety] without agoraphobia: Secondary | ICD-10-CM

## 2016-06-13 DIAGNOSIS — R635 Abnormal weight gain: Secondary | ICD-10-CM | POA: Diagnosis not present

## 2016-06-13 DIAGNOSIS — Z833 Family history of diabetes mellitus: Secondary | ICD-10-CM

## 2016-06-13 LAB — CBC WITH DIFFERENTIAL/PLATELET
Basophils Absolute: 0.1 10*3/uL (ref 0.0–0.1)
Basophils Relative: 0.9 % (ref 0.0–3.0)
EOS PCT: 2.8 % (ref 0.0–5.0)
Eosinophils Absolute: 0.3 10*3/uL (ref 0.0–0.7)
HEMATOCRIT: 39.1 % (ref 36.0–46.0)
Hemoglobin: 13.1 g/dL (ref 12.0–15.0)
LYMPHS PCT: 20 % (ref 12.0–46.0)
Lymphs Abs: 1.8 10*3/uL (ref 0.7–4.0)
MCHC: 33.6 g/dL (ref 30.0–36.0)
MCV: 92.6 fl (ref 78.0–100.0)
MONOS PCT: 9.3 % (ref 3.0–12.0)
Monocytes Absolute: 0.8 10*3/uL (ref 0.1–1.0)
NEUTROS ABS: 6.1 10*3/uL (ref 1.4–7.7)
Neutrophils Relative %: 67 % (ref 43.0–77.0)
PLATELETS: 340 10*3/uL (ref 150.0–400.0)
RBC: 4.22 Mil/uL (ref 3.87–5.11)
RDW: 12.9 % (ref 11.5–15.5)
WBC: 9.1 10*3/uL (ref 4.0–10.5)

## 2016-06-13 LAB — HEPATIC FUNCTION PANEL
ALT: 15 U/L (ref 0–35)
AST: 18 U/L (ref 0–37)
Albumin: 4.1 g/dL (ref 3.5–5.2)
Alkaline Phosphatase: 78 U/L (ref 39–117)
BILIRUBIN DIRECT: 0.1 mg/dL (ref 0.0–0.3)
TOTAL PROTEIN: 6.9 g/dL (ref 6.0–8.3)
Total Bilirubin: 0.7 mg/dL (ref 0.2–1.2)

## 2016-06-13 LAB — LIPID PANEL
CHOL/HDL RATIO: 3
CHOLESTEROL: 176 mg/dL (ref 0–200)
HDL: 54.5 mg/dL (ref >39.00)
LDL CALC: 99 mg/dL (ref 0–99)
NonHDL: 121.15
TRIGLYCERIDES: 113 mg/dL (ref 0.0–149.0)
VLDL: 22.6 mg/dL (ref 0.0–40.0)

## 2016-06-13 LAB — BASIC METABOLIC PANEL
BUN: 21 mg/dL (ref 6–23)
CALCIUM: 9.2 mg/dL (ref 8.4–10.5)
CHLORIDE: 105 meq/L (ref 96–112)
CO2: 26 meq/L (ref 19–32)
Creatinine, Ser: 0.68 mg/dL (ref 0.40–1.20)
GFR: 98.7 mL/min (ref >60.00)
GLUCOSE: 97 mg/dL (ref 70–99)
POTASSIUM: 4.4 meq/L (ref 3.5–5.1)
SODIUM: 138 meq/L (ref 135–145)

## 2016-06-13 LAB — T4, FREE: Free T4: 0.64 ng/dL (ref 0.60–1.60)

## 2016-06-13 LAB — HEMOGLOBIN A1C: Hgb A1c MFr Bld: 5.7 % (ref 4.6–6.5)

## 2016-06-13 LAB — TSH: TSH: 1.95 u[IU]/mL (ref 0.35–4.50)

## 2016-06-14 LAB — INSULIN, FASTING: Insulin fasting, serum: 16.2 u[IU]/mL (ref 2.0–19.6)

## 2016-06-14 LAB — CORTISOL-AM, BLOOD: CORTISOL - AM: 10 ug/dL

## 2016-06-26 NOTE — Progress Notes (Signed)
Chief Complaint  Patient presents with  . Follow-up    HPI: Karen Fleming 46 y.o.   Fu weight gain panic    Still having  Panic  2 x per day  2 x per day  And  Was garedeningn startled ( Originally thought she was taking 200 mg a day of sertraline but she is only taking 100 mg a day.)  Prefers not to take a been so but still has a few left from couple years ago and she did take 1 once with help. She is here with her son today thinks that her attacks are getting a little more frequent have one when coming in from the garden for no reason she'll feel suddenly startled and then gets racing heart in anxiety and nervous. She also is having a night. No other changes in her health. Has some snoring but no sleep apnea symptoms.  ROS: See pertinent positives and negatives per HPI.  Past Medical History:  Diagnosis Date  . HELLP (hemolytic anemia/elev liver enzymes/low platelets in pregnancy)    c-section 2007  . Hyperlipidemia   . Hypothyroidism   . Pancreatitis    2000 and 2005  . Panic attacks    on meds pre pregnancy/hx    Family History  Problem Relation Age of Onset  . Heart attack Father     in 33s   . Hyperlipidemia Father   . Diabetes      gm and gf  . Hypertension Father     gm and gf  . Arthritis      gp ? OA  . Hyperlipidemia Mother   . Colon cancer Neg Hx   . Stomach cancer Neg Hx   . CVA Father     arrynthmia ? af  age 56   . Throat cancer Father     Social History   Social History  . Marital status: Married    Spouse name: N/A  . Number of children: N/A  . Years of education: N/A   Social History Main Topics  . Smoking status: Never Smoker  . Smokeless tobacco: Never Used  . Alcohol use No  . Drug use: No  . Sexual activity: Not Asked   Other Topics Concern  . None   Social History Narrative   HH of 3 pet  Dog     62 yo son child at home [place]   No ets   No caffeine   Drink milk 2-3 x per day    reg exercise    [place]            Outpatient Medications Prior to Visit  Medication Sig Dispense Refill  . atorvastatin (LIPITOR) 10 MG tablet TAKE 1 TABLET BY MOUTH  DAILY 90 tablet 1  . levothyroxine (SYNTHROID, LEVOTHROID) 50 MCG tablet TAKE 1 TABLET BY MOUTH  DAILY 90 tablet 1  . LINZESS 290 MCG CAPS capsule Take 290 mcg by mouth daily as needed (constipation).   2  . sertraline (ZOLOFT) 50 MG tablet Take 2 tablets (100 mg total) by mouth daily. 180 tablet 1   No facility-administered medications prior to visit.      EXAM:  BP 108/70 (BP Location: Right Arm, Patient Position: Sitting, Cuff Size: Normal)   Pulse 65   Temp 98 F (36.7 C) (Oral)   Ht 5' 1.75" (1.568 m)   Wt 164 lb 8 oz (74.6 kg)   BMI 30.33 kg/m   Body mass index is  30.33 kg/m.  GENERAL: vitals reviewed and listed above, alert, oriented, appears well hydrated and in no acute distress s PSYCH: pleasant and cooperative, no obvious depression or anxiety looks well. Lab Results  Component Value Date   WBC 9.1 06/13/2016   HGB 13.1 06/13/2016   HCT 39.1 06/13/2016   PLT 340.0 06/13/2016   GLUCOSE 97 06/13/2016   CHOL 176 06/13/2016   TRIG 113.0 06/13/2016   HDL 54.50 06/13/2016   LDLDIRECT 173.7 02/20/2011   LDLCALC 99 06/13/2016   ALT 15 06/13/2016   AST 18 06/13/2016   NA 138 06/13/2016   K 4.4 06/13/2016   CL 105 06/13/2016   CREATININE 0.68 06/13/2016   BUN 21 06/13/2016   CO2 26 06/13/2016   TSH 1.95 06/13/2016   HGBA1C 5.7 06/13/2016   Reviewed lab reports with patient. ASSESSMENT AND PLAN:  Discussed the following assessment and plan:  PANIC DISORDER  Medication management Panic disorder not responding 200 mg sertraline. We discussed options. Of changing medicines or upping dose. Increase the sertraline to 150 mg a day prescription given for clonazepam can use a half one or 2 perhaps at night for 3 nights in a row to calm down her sleep and see if that helps her panic in the day. I don't think she is at risk  of long-term dependence. Send [place] a message in my chart in 2-3 weeks and plan follow-up visit in about 6 weeks. The original prescription for citalopram should be canceled.  Reviewed her symptoms again today and they really sound like panic and not cardiovascular other metabolic issues. She doesn't related to taking her thyroid medicine. -Patient advised to return or notify health care team  if symptoms worsen ,persist or new concerns arise.  Patient Instructions  We can consider switch her medicine over to citalopram or Celexa. Their other options. But  Better to increase dose of current  For the short-term you can go ahead intake the benzo clonazepam   to calm down   And prevent panic   until the medicine works.  the sertraline to 150 mg a dayday. Plan ROV in 6 weeks or thereabouts   When due    tyr clon  At night for 3+ nights.  Plan  conctat [place] in 2-3 weeks  ROV in 6 weeks .     Neta Mends. Deena Shaub M.D.

## 2016-06-27 ENCOUNTER — Encounter: Payer: Self-pay | Admitting: Internal Medicine

## 2016-06-27 ENCOUNTER — Ambulatory Visit (INDEPENDENT_AMBULATORY_CARE_PROVIDER_SITE_OTHER): Payer: 59 | Admitting: Internal Medicine

## 2016-06-27 VITALS — BP 108/70 | HR 65 | Temp 98.0°F | Ht 61.75 in | Wt 164.5 lb

## 2016-06-27 DIAGNOSIS — Z79899 Other long term (current) drug therapy: Secondary | ICD-10-CM

## 2016-06-27 DIAGNOSIS — F410 Panic disorder [episodic paroxysmal anxiety] without agoraphobia: Secondary | ICD-10-CM | POA: Diagnosis not present

## 2016-06-27 MED ORDER — CITALOPRAM HYDROBROMIDE 20 MG PO TABS: 20.0000 mg | ORAL_TABLET | Freq: Every day | ORAL | 3 refills | Status: DC

## 2016-06-27 MED ORDER — CLONAZEPAM 0.5 MG PO TABS: 0.5000 mg | ORAL_TABLET | Freq: Two times a day (BID) | ORAL | 1 refills | Status: DC | PRN

## 2016-06-27 NOTE — Patient Instructions (Addendum)
We can consider switch her medicine over to citalopram or Celexa. Their other options. But  Better to increase dose of current  For the short-term you can go ahead intake the benzo clonazepam   to calm down   And prevent panic   until the medicine works.  the sertraline to 150 mg a dayday. Plan ROV in 6 weeks or thereabouts   When due    tyr clon  At night for 3+ nights.  Plan  conctat [place] in 2-3 weeks  ROV in 6 weeks .

## 2016-07-24 ENCOUNTER — Other Ambulatory Visit: Payer: Self-pay | Admitting: Internal Medicine

## 2016-07-24 MED ORDER — SERTRALINE HCL 50 MG PO TABS: 150.0000 mg | ORAL_TABLET | Freq: Every day | ORAL | 1 refills | Status: DC

## 2016-07-24 NOTE — Telephone Encounter (Signed)
Pt need new Rx for sertraline  Pharm:  [facility] [place] Pt is aware that Dr. Fabian Sharp is not in the office and will not be returning until 07/31/16 she states that she is out of this medication and will be out today and would like to have the increase of tid of 50 MG of sertraline.

## 2016-07-24 NOTE — Telephone Encounter (Signed)
Rx updated per LOV notes. Sent to pharmacy. Pt aware. Nothing further needed.

## 2016-07-25 ENCOUNTER — Other Ambulatory Visit: Payer: Self-pay

## 2016-07-25 MED ORDER — SERTRALINE HCL 50 MG PO TABS: 150.0000 mg | ORAL_TABLET | Freq: Every day | ORAL | 1 refills | Status: DC

## 2016-08-01 ENCOUNTER — Ambulatory Visit: Payer: 59 | Admitting: Internal Medicine

## 2016-08-01 NOTE — Progress Notes (Deleted)
No chief complaint on file.   HPI: Karen Fleming 46 y.o. come in for fu anxiety  attackts  And med evalutaion  Inc to  Sertraline 150 mg  ROS: See pertinent positives and negatives per HPI.  Past Medical History:  Diagnosis Date  . HELLP (hemolytic anemia/elev liver enzymes/low platelets in pregnancy)    c-section 2007  . Hyperlipidemia   . Hypothyroidism   . Pancreatitis    2000 and 2005  . Panic attacks    on meds pre pregnancy/hx    Family History  Problem Relation Age of Onset  . Heart attack Father        in 78s   . Hyperlipidemia Father   . Diabetes Unknown        gm and gf  . Hypertension Father        gm and gf  . Arthritis Unknown        gp ? OA  . Hyperlipidemia Mother   . Colon cancer Neg Hx   . Stomach cancer Neg Hx   . CVA Father        arrynthmia ? af  age 99   . Throat cancer Father     Social History   Social History  . Marital status: Married    Spouse name: N/A  . Number of children: N/A  . Years of education: N/A   Social History Main Topics  . Smoking status: Never Smoker  . Smokeless tobacco: Never Used  . Alcohol use No  . Drug use: No  . Sexual activity: Not on file   Other Topics Concern  . Not on file   Social History Narrative   HH of 3 pet  Dog     81 yo son child at home [place]   No ets   No caffeine   Drink milk 2-3 x per day    reg exercise    [place]             Outpatient Medications Prior to Visit  Medication Sig Dispense Refill  . atorvastatin (LIPITOR) 10 MG tablet TAKE 1 TABLET BY MOUTH  DAILY 90 tablet 1  . citalopram (CELEXA) 20 MG tablet Take 1 tablet (20 mg total) by mouth daily. As directed 30 tablet 3  . clonazePAM (KLONOPIN) 0.5 MG tablet Take 1-2 tablets (0.5-1 mg total) by mouth 2 (two) times daily as needed for anxiety. 24 tablet 1  . levothyroxine (SYNTHROID, LEVOTHROID) 50 MCG tablet TAKE 1 TABLET BY MOUTH  DAILY 90 tablet 1  . LINZESS 290 MCG CAPS capsule Take 290 mcg by  mouth daily as needed (constipation).   2  . sertraline (ZOLOFT) 50 MG tablet Take 3 tablets (150 mg total) by mouth daily. 270 tablet 1   No facility-administered medications prior to visit.      EXAM:  There were no vitals taken for this visit.  There is no height or weight on file to calculate BMI.  GENERAL: vitals reviewed and listed above, alert, oriented, appears well hydrated and in no acute distress HEENT: atraumatic, conjunctiva  clear, no obvious abnormalities on inspection of external nose and ears OP : no lesion edema or exudate  NECK: no obvious masses on inspection palpation  LUNGS: clear to auscultation bilaterally, no wheezes, rales or rhonchi, good air movement CV: HRRR, no clubbing cyanosis or  peripheral edema nl cap refill  MS: moves all extremities without noticeable focal  abnormality PSYCH: pleasant and cooperative, no  obvious depression or anxiety Lab Results  Component Value Date   WBC 9.1 06/13/2016   HGB 13.1 06/13/2016   HCT 39.1 06/13/2016   PLT 340.0 06/13/2016   GLUCOSE 97 06/13/2016   CHOL 176 06/13/2016   TRIG 113.0 06/13/2016   HDL 54.50 06/13/2016   LDLDIRECT 173.7 02/20/2011   LDLCALC 99 06/13/2016   ALT 15 06/13/2016   AST 18 06/13/2016   NA 138 06/13/2016   K 4.4 06/13/2016   CL 105 06/13/2016   CREATININE 0.68 06/13/2016   BUN 21 06/13/2016   CO2 26 06/13/2016   TSH 1.95 06/13/2016   HGBA1C 5.7 06/13/2016   BP Readings from Last 3 Encounters:  06/27/16 108/70  06/12/16 110/72  10/04/15 126/80   Wt Readings from Last 3 Encounters:  06/27/16 164 lb 8 oz (74.6 kg)  06/12/16 167 lb 12.8 oz (76.1 kg)  10/04/15 151 lb 9.6 oz (68.8 kg)    ASSESSMENT AND PLAN:  Discussed the following assessment and plan:  PANIC DISORDER  Medication management  -Patient advised to return or notify health care team  if  new concerns arise.  There are no Patient Instructions on file for this visit.   Neta MendsWanda K. Kel Senn M.D.

## 2016-08-02 ENCOUNTER — Encounter: Payer: Self-pay | Admitting: Internal Medicine

## 2016-08-02 ENCOUNTER — Ambulatory Visit (INDEPENDENT_AMBULATORY_CARE_PROVIDER_SITE_OTHER): Payer: 59 | Admitting: Internal Medicine

## 2016-08-02 VITALS — BP 102/70 | HR 70 | Temp 98.2°F | Ht 61.75 in | Wt 167.2 lb

## 2016-08-02 DIAGNOSIS — R635 Abnormal weight gain: Secondary | ICD-10-CM | POA: Diagnosis not present

## 2016-08-02 DIAGNOSIS — F410 Panic disorder [episodic paroxysmal anxiety] without agoraphobia: Secondary | ICD-10-CM | POA: Diagnosis not present

## 2016-08-02 DIAGNOSIS — Z79899 Other long term (current) drug therapy: Secondary | ICD-10-CM | POA: Diagnosis not present

## 2016-08-02 MED ORDER — SERTRALINE HCL 100 MG PO TABS: 150.0000 mg | ORAL_TABLET | Freq: Every day | ORAL | 1 refills | Status: DC

## 2016-08-02 NOTE — Patient Instructions (Addendum)
  Continue on 150 mg sertraline per day  .  Clonazepam as needed as we discussed .   Can take 2-3 days in a row or at night if needed.    Continue lifestyle intervention healthy eating and exercise . Wt Readings from Last 3 Encounters:  08/02/16 167 lb 3.2 oz (75.8 kg)  06/27/16 164 lb 8 oz (74.6 kg)  06/12/16 167 lb 12.8 oz (76.1 kg)   Advise look into  joining weight watchers   Check  To see if flex spending and I can  Certify use  As medically necessary   To be used for  Cost .  Will review record to look for  Other interventions for weight gain .  Your insulin and  Cortisol and thyroid levels were ok .   ROV in 2 months or as needed

## 2016-08-02 NOTE — Progress Notes (Signed)
Chief Complaint  Patient presents with  . Follow-up    HPI: Karen Fleming 46 y.o. comes i nf for  Fu panic attacks and meds.  She did take the clonazepam every night for a week and her nocturnal anxiety wakening means resolved. She is now on 150 mg of sertraline a day. Some days she has no panic attacks in some time she has one or 2 that don't last very long. She's not taking the clonazepam at this time. Still battling weight gain. She is exercising regularly asked about medicine causing the side effect.  ROS: See pertinent positives and negatives per HPI. fam gm may have had something not dx not sure  Past Medical History:  Diagnosis Date  . HELLP (hemolytic anemia/elev liver enzymes/low platelets in pregnancy)    c-section 2007  . Hyperlipidemia   . Hypothyroidism   . Pancreatitis    2000 and 2005  . Panic attacks    on meds pre pregnancy/hx    Family History  Problem Relation Age of Onset  . Heart attack Father        in 5650s   . Hyperlipidemia Father   . Diabetes Unknown        gm and gf  . Hypertension Father        gm and gf  . Arthritis Unknown        gp ? OA  . Hyperlipidemia Mother   . Colon cancer Neg Hx   . Stomach cancer Neg Hx   . CVA Father        arrynthmia ? af  age 47   . Throat cancer Father     Social History   Social History  . Marital status: Married    Spouse name: N/A  . Number of children: N/A  . Years of education: N/A   Social History Main Topics  . Smoking status: Never Smoker  . Smokeless tobacco: Never Used  . Alcohol use No  . Drug use: No  . Sexual activity: Not Asked   Other Topics Concern  . None   Social History Narrative   HH of 3 pet  Dog     369 yo son child at home [place]   No ets   No caffeine   Drink milk 2-3 x per day    reg exercise    [place]             Outpatient Medications Prior to Visit  Medication Sig Dispense Refill  . atorvastatin (LIPITOR) 10 MG tablet TAKE 1 TABLET BY  MOUTH  DAILY 90 tablet 1  . clonazePAM (KLONOPIN) 0.5 MG tablet Take 1-2 tablets (0.5-1 mg total) by mouth 2 (two) times daily as needed for anxiety. 24 tablet 1  . levothyroxine (SYNTHROID, LEVOTHROID) 50 MCG tablet TAKE 1 TABLET BY MOUTH  DAILY 90 tablet 1  . LINZESS 290 MCG CAPS capsule Take 290 mcg by mouth daily as needed (constipation).   2  . sertraline (ZOLOFT) 50 MG tablet Take 3 tablets (150 mg total) by mouth daily. 270 tablet 1  . citalopram (CELEXA) 20 MG tablet Take 1 tablet (20 mg total) by mouth daily. As directed (Patient not taking: Reported on 08/02/2016) 30 tablet 3   No facility-administered medications prior to visit.      EXAM:  BP 102/70 (BP Location: Right Arm, Patient Position: Sitting, Cuff Size: Normal)   Pulse 70   Temp 98.2 F (36.8 C) (Oral)   Ht  5' 1.75" (1.568 m)   Wt 167 lb 3.2 oz (75.8 kg)   BMI 30.83 kg/m   Body mass index is 30.83 kg/m.  GENERAL: vitals reviewed and listed above, alert, oriented, appears well hydrated and in no acute distress PSYCH: pleasant and cooperative, no obvious depression or anxiety  ASSESSMENT AND PLAN:  Discussed the following assessment and plan:  PANIC DISORDER  Medication management  Excessive body weight gain Panic attacks are less frequent at this time no specific triggers. Should have the clonazepam on hand if she is getting and ramping up and take it every day for a few days. She should have a refill on this. Otherwise standard 150 mg a day. Her weight gain occurred before she was on the sertraline in could have many factors. At this time would have her joint something like [employer] to do some dietary accountability. She can let us know if she has flex spending if she needs documentation for pain. Mrs. is medically indicated. Plan follow-up visit in about 2 months that's when she is due for her CPX anyway. -Patient advised to return or notify health care team  if symptoms worsen ,persist or new  concerns arise.  Patient Instructions    Continue on 150 mg sertraline per day  .  Clonazepam as needed as we discussed .   Can take 2-3 days in a row or at night if needed.    Continue lifestyle intervention healthy eating and exercise . Wt Readings from Last 3 Encounters:  08/02/16 167 lb 3.2 oz (75.8 kg)  06/27/16 164 lb 8 oz (74.6 kg)  06/12/16 167 lb 12.8 oz (76.1 kg)   Advise look into  joining weight watchers   Check  To see if flex spending and I can  Certify use  As medically necessary   To be used for  Cost .  Will review record to look for  Other interventions for weight gain .  Your insulin and  Cortisol and thyroid levels were ok .   ROV in 2 months or as needed      Neta Mends. Sundae Maners M.D.

## 2016-10-03 NOTE — Progress Notes (Signed)
Chief Complaint  Patient presents with  . Annual Exam    HPI: Patient  Karen Fleming  46 y.o. comes in today for Preventive Health Care visit  Here with son today    Anxiety   Decrease   To 50 mg    Magnesium and b12 .    Last week .   50 mg  Sertraline  Not needeing clonazepam .   Feels well.  Periods nl monthly  Last about 3 day s  Health Maintenance  Topic Date Due  . HIV Screening  10/07/2017 (Originally [birth year]1984)  . INFLUENZA VACCINE  10/23/2016  . MAMMOGRAM  10/31/2016  . PAP SMEAR  10/26/2017  . TETANUS/TDAP  02/26/2021   Health Maintenance Review LIFESTYLE:  Exercise:   Swimming every day  Tobacco/ETS: no Alcohol:  no Sugar beverages: no Sleep: 7 hours  Drug use: no HH of   3  Plus 1 dog  Work: outside house .    ROS:  GEN/ HEENT: No fever, significant weight changes sweats headaches vision problems hearing changes, CV/ PULM; No chest pain shortness of breath cough, syncope,edema  change in exercise tolerance. GI /GU: No adominal pain, vomiting, change in bowel habits. No blood in the stool. No significant GU symptoms. SKIN/HEME: ,no acute skin rashes suspicious lesions or bleeding. No lymphadenopathy, nodules, masses.  NEURO/ PSYCH:  No neurologic signs such as weakness numbness. No depression anxiety. IMM/ Allergy: No unusual infections.  Allergy .   REST of 12 system review negative except as per HPI   Past Medical History:  Diagnosis Date  . HELLP (hemolytic anemia/elev liver enzymes/low platelets in pregnancy)    c-section 2007  . Hyperlipidemia   . Hypothyroidism   . Pancreatitis    2000 and 2005  . Panic attacks    on meds pre pregnancy/hx    Past Surgical History:  Procedure Laterality Date  . APPENDECTOMY  2001  . CESAREAN SECTION  2007    Family History  Problem Relation Age of Onset  . Heart attack Father        in 17s   . Hyperlipidemia Father   . Hypertension Father        gm and gf  . CVA Father        arrynthmia ? af   age 65   . Throat cancer Father   . Diabetes Unknown        gm and gf  . Arthritis Unknown        gp ? OA  . Hyperlipidemia Mother   . Colon cancer Neg Hx   . Stomach cancer Neg Hx     Social History   Social History  . Marital status: Married    Spouse name: N/A  . Number of children: N/A  . Years of education: N/A   Social History Main Topics  . Smoking status: Never Smoker  . Smokeless tobacco: Never Used  . Alcohol use No  . Drug use: No  . Sexual activity: Not Asked   Other Topics Concern  . None   Social History Narrative   HH of 3 pet  Dog     70 yo son child at home [place]   No ets   No caffeine   Drink milk 2-3 x per day    reg exercise    [place]             Outpatient Medications Prior to Visit  Medication Sig Dispense Refill  .  atorvastatin (LIPITOR) 10 MG tablet TAKE 1 TABLET BY MOUTH  DAILY 90 tablet 1  . levothyroxine (SYNTHROID, LEVOTHROID) 50 MCG tablet TAKE 1 TABLET BY MOUTH  DAILY 90 tablet 1  . LINZESS 290 MCG CAPS capsule Take 290 mcg by mouth daily as needed (constipation).   2  . clonazePAM (KLONOPIN) 0.5 MG tablet Take 1-2 tablets (0.5-1 mg total) by mouth 2 (two) times daily as needed for anxiety. 24 tablet 1  . sertraline (ZOLOFT) 100 MG tablet Take 1.5 tablets (150 mg total) by mouth daily. 135 tablet 1   No facility-administered medications prior to visit.      EXAM:  BP 106/80 (BP Location: Right Arm, Patient Position: Sitting, Cuff Size: Normal)   Pulse 69   Temp 98.3 F (36.8 C) (Oral)   Ht '5\' 2"'  (1.575 m)   Wt 166 lb 9.6 oz (75.6 kg)   BMI 30.47 kg/m   Body mass index is 30.47 kg/m. Wt Readings from Last 3 Encounters:  10/07/16 166 lb 9.6 oz (75.6 kg)  08/02/16 167 lb 3.2 oz (75.8 kg)  06/27/16 164 lb 8 oz (74.6 kg)    Physical Exam: Vital signs reviewed BOF:BPZW is a well-developed well-nourished alert cooperative    who appearsr stated age in no acute distress.  HEENT: normocephalic atraumatic ,  Eyes: PERRL EOM's full, conjunctiva clearglasses , Nares: paten,t no deformity discharge or tenderness., Ears: no deformity EAC's clear TMs with normal landmarks. Mouth: clear OP, no lesions, edema.  Moist mucous membranes. Dentition in adequate repair. NECK: supple without masses, thyromegaly or bruits. CHEST/PULM:  Clear to auscultation and percussion breath sounds equal no wheeze , rales or rhonchi. No chest wall deformities or tenderness. Breast: normal by inspection . No dimpling, discharge, masses, tenderness or discharge . CV: PMI is nondisplaced, S1 S2 no gallops, murmurs, rubs. Peripheral pulses are full without delay.No JVD .  ABDOMEN: Bowel sounds normal nontender  No guard or rebound, no hepato splenomegal no CVA tenderness.  No hernia. Extremtities:  No clubbing cyanosis or edema, no acute joint swelling or redness no focal atrophy NEURO:  Oriented x3, cranial nerves 3-12 appear to be intact, no obvious focal weakness,gait within normal limits no abnormal reflexes or asymmetrical SKIN: No acute rashes normal turgor, color, no bruising or petechiae. No stria  Hair nl  PSYCH: Oriented, good eye contact, no obvious depression anxiety, cognition and judgment appear normal. LN: no cervical axillary inguinal adenopathy  Lab Results  Component Value Date   WBC 9.1 06/13/2016   HGB 13.1 06/13/2016   HCT 39.1 06/13/2016   PLT 340.0 06/13/2016   GLUCOSE 97 06/13/2016   CHOL 176 06/13/2016   TRIG 113.0 06/13/2016   HDL 54.50 06/13/2016   LDLDIRECT 173.7 02/20/2011   LDLCALC 99 06/13/2016   ALT 15 06/13/2016   AST 18 06/13/2016   NA 138 06/13/2016   K 4.4 06/13/2016   CL 105 06/13/2016   CREATININE 0.68 06/13/2016   BUN 21 06/13/2016   CO2 26 06/13/2016   TSH 1.95 06/13/2016   HGBA1C 5.7 06/13/2016    BP Readings from Last 3 Encounters:  10/07/16 106/80  08/02/16 102/70  06/27/16 108/70    Lab results reviewed with patient  Labs done in [facility] Readings from Last 3  Encounters:  10/07/16 166 lb 9.6 oz (75.6 kg)  08/02/16 167 lb 3.2 oz (75.8 kg)  06/27/16 164 lb 8 oz (74.6 kg)    ASSESSMENT AND PLAN:  Discussed the following  assessment and plan:  Visit for preventive health examination - Plan: TSH  Medication management - Plan: TSH  Hypothyroidism, unspecified type - recheck now on mag and supp if all okl then yearly - Plan: TSH  Hyperlipidemia, unspecified hyperlipidemia type - acceptable  labs at this time  PANIC DISORDER - doing wuite well ono benzos dec to 50 ssertraline fro now adding b12 adn magnesium   Family history of diabetes mellitus (DM)  Patient Care Team: Thailan Sava, Standley Brooking, MD as PCP - General Haygood, Seymour Bars, MD (Obstetrics and Gynecology) Juanita Craver, MD as Consulting Physician (Gastroenterology) Patient Instructions  Glad you are doing well  Stay on the 50 mg sertraline add resistance training  Etc.   Repeating thyroid tests today and if ok then yearly check .     Preventive Care 40-64 Years, Female Preventive care refers to lifestyle choices and visits with your health care provider that can promote health and wellness. What does preventive care include?  A yearly physical exam. This is also called an annual well check.  Dental exams once or twice a year.  Routine eye exams. Ask your health care provider how often you should have your eyes checked.  Personal lifestyle choices, including: ? Daily care of your teeth and gums. ? Regular physical activity. ? Eating a healthy diet. ? Avoiding tobacco and drug use. ? Limiting alcohol use. ? Practicing safe sex. ? Taking low-dose aspirin daily starting at age 72. ? Taking vitamin and mineral supplements as recommended by your health care provider. What happens during an annual well check? The services and screenings done by your health care provider during your annual well check will depend on your age, overall health, lifestyle risk factors, and family history  of disease. Counseling Your health care provider may ask you questions about your:  Alcohol use.  Tobacco use.  Drug use.  Emotional well-being.  Home and relationship well-being.  Sexual activity.  Eating habits.  Work and work Statistician.  Method of birth control.  Menstrual cycle.  Pregnancy history.  Screening You may have the following tests or measurements:  Height, weight, and BMI.  Blood pressure.  Lipid and cholesterol levels. These may be checked every 5 years, or more frequently if you are over 91 years old.  Skin check.  Lung cancer screening. You may have this screening every year starting at age 36 if you have a 30-pack-year history of smoking and currently smoke or have quit within the past 15 years.  Fecal occult blood test (FOBT) of the stool. You may have this test every year starting at age 63.  Flexible sigmoidoscopy or colonoscopy. You may have a sigmoidoscopy every 5 years or a colonoscopy every 10 years starting at age 72.  Hepatitis C blood test.  Hepatitis B blood test.  Sexually transmitted disease (STD) testing.  Diabetes screening. This is done by checking your blood sugar (glucose) after you have not eaten for a while (fasting). You may have this done every 1-3 years.  Mammogram. This may be done every 1-2 years. Talk to your health care provider about when you should start having regular mammograms. This may depend on whether you have a family history of breast cancer.  BRCA-related cancer screening. This may be done if you have a family history of breast, ovarian, tubal, or peritoneal cancers.  Pelvic exam and Pap test. This may be done every 3 years starting at age 46. Starting at age 53, this may be done every  5 years if you have a Pap test in combination with an HPV test.  Bone density scan. This is done to screen for osteoporosis. You may have this scan if you are at high risk for osteoporosis.  Discuss your test results,  treatment options, and if necessary, the need for more tests with your health care provider. Vaccines Your health care provider may recommend certain vaccines, such as:  Influenza vaccine. This is recommended every year.  Tetanus, diphtheria, and acellular pertussis (Tdap, Td) vaccine. You may need a Td booster every 10 years.  Varicella vaccine. You may need this if you have not been vaccinated.  Zoster vaccine. You may need this after age 42.  Measles, mumps, and rubella (MMR) vaccine. You may need at least one dose of MMR if you were born in 1957 or later. You may also need a second dose.  Pneumococcal 13-valent conjugate (PCV13) vaccine. You may need this if you have certain conditions and were not previously vaccinated.  Pneumococcal polysaccharide (PPSV23) vaccine. You may need one or two doses if you smoke cigarettes or if you have certain conditions.  Meningococcal vaccine. You may need this if you have certain conditions.  Hepatitis A vaccine. You may need this if you have certain conditions or if you travel or work in places where you may be exposed to hepatitis A.  Hepatitis B vaccine. You may need this if you have certain conditions or if you travel or work in places where you may be exposed to hepatitis B.  Haemophilus influenzae type b (Hib) vaccine. You may need this if you have certain conditions.  Talk to your health care provider about which screenings and vaccines you need and how often you need them. This information is not intended to replace advice given to you by your health care provider. Make sure you discuss any questions you have with your health care provider. Document Released: 04/07/2015 Document Revised: 11/29/2015 Document Reviewed: 01/10/2015 Elsevier Interactive Patient Education  2017 [employer]. Nilza Eaker M.D.

## 2016-10-07 ENCOUNTER — Encounter: Payer: Self-pay | Admitting: Internal Medicine

## 2016-10-07 ENCOUNTER — Ambulatory Visit (INDEPENDENT_AMBULATORY_CARE_PROVIDER_SITE_OTHER): Payer: 59 | Admitting: Internal Medicine

## 2016-10-07 VITALS — BP 106/80 | HR 69 | Temp 98.3°F | Ht 62.0 in | Wt 166.6 lb

## 2016-10-07 DIAGNOSIS — E039 Hypothyroidism, unspecified: Secondary | ICD-10-CM

## 2016-10-07 DIAGNOSIS — E785 Hyperlipidemia, unspecified: Secondary | ICD-10-CM | POA: Diagnosis not present

## 2016-10-07 DIAGNOSIS — Z79899 Other long term (current) drug therapy: Secondary | ICD-10-CM

## 2016-10-07 DIAGNOSIS — Z0000 Encounter for general adult medical examination without abnormal findings: Secondary | ICD-10-CM | POA: Diagnosis not present

## 2016-10-07 DIAGNOSIS — F410 Panic disorder [episodic paroxysmal anxiety] without agoraphobia: Secondary | ICD-10-CM

## 2016-10-07 DIAGNOSIS — Z833 Family history of diabetes mellitus: Secondary | ICD-10-CM

## 2016-10-07 LAB — TSH: TSH: 2.81 u[IU]/mL (ref 0.35–4.50)

## 2016-10-07 MED ORDER — SERTRALINE HCL 50 MG PO TABS: 50.0000 mg | ORAL_TABLET | Freq: Every day | ORAL | 1 refills | Status: DC

## 2016-10-07 NOTE — Patient Instructions (Signed)
Glad you are doing well  Stay on the 50 mg sertraline add resistance training  Etc.   Repeating thyroid tests today and if ok then yearly check .     Preventive Care 40-64 Years, Female Preventive care refers to lifestyle choices and visits with your health care provider that can promote health and wellness. What does preventive care include?  A yearly physical exam. This is also called an annual well check.  Dental exams once or twice a year.  Routine eye exams. Ask your health care provider how often you should have your eyes checked.  Personal lifestyle choices, including: ? Daily care of your teeth and gums. ? Regular physical activity. ? Eating a healthy diet. ? Avoiding tobacco and drug use. ? Limiting alcohol use. ? Practicing safe sex. ? Taking low-dose aspirin daily starting at age 32. ? Taking vitamin and mineral supplements as recommended by your health care provider. What happens during an annual well check? The services and screenings done by your health care provider during your annual well check will depend on your age, overall health, lifestyle risk factors, and family history of disease. Counseling Your health care provider may ask you questions about your:  Alcohol use.  Tobacco use.  Drug use.  Emotional well-being.  Home and relationship well-being.  Sexual activity.  Eating habits.  Work and work Statistician.  Method of birth control.  Menstrual cycle.  Pregnancy history.  Screening You may have the following tests or measurements:  Height, weight, and BMI.  Blood pressure.  Lipid and cholesterol levels. These may be checked every 5 years, or more frequently if you are over 47 years old.  Skin check.  Lung cancer screening. You may have this screening every year starting at age 88 if you have a 30-pack-year history of smoking and currently smoke or have quit within the past 15 years.  Fecal occult blood test (FOBT) of the stool.  You may have this test every year starting at age 49.  Flexible sigmoidoscopy or colonoscopy. You may have a sigmoidoscopy every 5 years or a colonoscopy every 10 years starting at age 45.  Hepatitis C blood test.  Hepatitis B blood test.  Sexually transmitted disease (STD) testing.  Diabetes screening. This is done by checking your blood sugar (glucose) after you have not eaten for a while (fasting). You may have this done every 1-3 years.  Mammogram. This may be done every 1-2 years. Talk to your health care provider about when you should start having regular mammograms. This may depend on whether you have a family history of breast cancer.  BRCA-related cancer screening. This may be done if you have a family history of breast, ovarian, tubal, or peritoneal cancers.  Pelvic exam and Pap test. This may be done every 3 years starting at age 71. Starting at age 10, this may be done every 5 years if you have a Pap test in combination with an HPV test.  Bone density scan. This is done to screen for osteoporosis. You may have this scan if you are at high risk for osteoporosis.  Discuss your test results, treatment options, and if necessary, the need for more tests with your health care provider. Vaccines Your health care provider may recommend certain vaccines, such as:  Influenza vaccine. This is recommended every year.  Tetanus, diphtheria, and acellular pertussis (Tdap, Td) vaccine. You may need a Td booster every 10 years.  Varicella vaccine. You may need this if you have  not been vaccinated.  Zoster vaccine. You may need this after age 60.  Measles, mumps, and rubella (MMR) vaccine. You may need at least one dose of MMR if you were born in 1957 or later. You may also need a second dose.  Pneumococcal 13-valent conjugate (PCV13) vaccine. You may need this if you have certain conditions and were not previously vaccinated.  Pneumococcal polysaccharide (PPSV23) vaccine. You may need  one or two doses if you smoke cigarettes or if you have certain conditions.  Meningococcal vaccine. You may need this if you have certain conditions.  Hepatitis A vaccine. You may need this if you have certain conditions or if you travel or work in places where you may be exposed to hepatitis A.  Hepatitis B vaccine. You may need this if you have certain conditions or if you travel or work in places where you may be exposed to hepatitis B.  Haemophilus influenzae type b (Hib) vaccine. You may need this if you have certain conditions.  Talk to your health care provider about which screenings and vaccines you need and how often you need them. This information is not intended to replace advice given to you by your health care provider. Make sure you discuss any questions you have with your health care provider. Document Released: 04/07/2015 Document Revised: 11/29/2015 Document Reviewed: 01/10/2015 Elsevier Interactive Patient Education  2017 Elsevier Inc.  

## 2016-10-28 ENCOUNTER — Other Ambulatory Visit: Payer: Self-pay | Admitting: Internal Medicine

## 2016-11-05 ENCOUNTER — Other Ambulatory Visit: Payer: Self-pay | Admitting: Internal Medicine

## 2016-11-20 LAB — HM MAMMOGRAPHY: HM Mammogram: NORMAL (ref 0–4)

## 2016-11-21 ENCOUNTER — Encounter: Payer: Self-pay | Admitting: Internal Medicine

## 2016-12-13 ENCOUNTER — Encounter: Payer: Self-pay | Admitting: Internal Medicine

## 2017-02-20 NOTE — Progress Notes (Signed)
Chief Complaint  Patient presents with  . Medication Management    discuss medication change for Zoloft    HPI: Karen BatmanClaudia Fleming 46 y.o. come in for feeling that her anxiety attacks are becoming more frequent and would like medication evaluation. She is currently on 50 mg of sertraline twice a day. Most recently she is having more frequent attacks although not severe and mitigated usually when she is sitting around not doing much not associated with activity exercise.  She will occasionally get middle the night symptoms. She describes it as feeling nervous and muscles tight throat tight and then she calms down and copes.  Changes in family mother recently had a stroke but is recovered father has a history of stroke and what sounds like a stent in his heart and has a history of heart arrhythmia they are both doing okay entering [place].  No new medicines but stopped taking fish oil because she developed over a few weeks ago.   Itchy rash   takin otc antihistamine.    ROS: See pertinent positives and negatives per HPI.  Denies any other racing heart tachycardia heart irregularities.  No history of sleep apnea.  No syncope or exercise intolerance.  Past Medical History:  Diagnosis Date  . HELLP (hemolytic anemia/elev liver enzymes/low platelets in pregnancy)    c-section 2007  . Hyperlipidemia   . Hypothyroidism   . Pancreatitis    2000 and 2005  . Panic attacks    on meds pre pregnancy/hx    Family History  Problem Relation Age of Onset  . Heart attack Father        in 3550s   . Hyperlipidemia Father   . Hypertension Father        gm and gf  . CVA Father        arrynthmia ? af  age 47   . Throat cancer Father   . Arrhythmia Father   . Diabetes Unknown        gm and gf  . Arthritis Unknown        gp ? OA  . Hyperlipidemia Mother   . CVA Mother   . Colon cancer Neg Hx   . Stomach cancer Neg Hx     Social History   Socioeconomic History  . Marital status: Married     Spouse name: None  . Number of children: None  . Years of education: None  . Highest education level: None  Social Needs  . Financial resource strain: None  . Food insecurity - worry: None  . Food insecurity - inability: None  . Transportation needs - medical: None  . Transportation needs - non-medical: None  Occupational History  . None  Tobacco Use  . Smoking status: Never Smoker  . Smokeless tobacco: Never Used  Substance and Sexual Activity  . Alcohol use: No  . Drug use: No  . Sexual activity: None  Other Topics Concern  . None  Social History Narrative   HH of 3 pet  Dog     709 yo son child at home [place]   No ets   No caffeine   Drink milk 2-3 x per day    reg exercise    [place]          Outpatient Medications Prior to Visit  Medication Sig Dispense Refill  . atorvastatin (LIPITOR) 10 MG tablet TAKE 1 TABLET BY MOUTH  DAILY 90 tablet 2  . levothyroxine (SYNTHROID, LEVOTHROID) 50  MCG tablet TAKE 1 TABLET BY MOUTH  DAILY 90 tablet 2  . LINZESS 290 MCG CAPS capsule Take 290 mcg by mouth daily as needed (constipation).   2  . sertraline (ZOLOFT) 50 MG tablet TAKE 1 TABLET BY MOUTH  DAILY 90 tablet 1   No facility-administered medications prior to visit.      EXAM:  BP 106/64 (BP Location: Right Arm, Patient Position: Sitting, Cuff Size: Normal)   Pulse 74   Temp 99.7 F (37.6 C) (Oral)   Wt 160 lb 3.2 oz (72.7 kg)   BMI 29.30 kg/m   Body mass index is 29.3 kg/m.  GENERAL: vitals reviewed and listed above, alert, oriented, appears well hydrated and in no acute distress HEENT: atraumatic, conjunctiva  clear, no obvious abnormalities on inspection of external nose and ears OP : no lesion edema or exudate  NECK: no obvious masses on inspection palpation  LUNGS: clear to auscultation bilaterally, no wheezes, rales or rhonchi, good air movement CV: HRRR, no clubbing cyanosis or  peripheral edema nl cap refill  MS: moves all extremities  without noticeable focal  Abnormality Skin faded blotchy rash on forearms  PSYCH: pleasant and cooperative, no obvious depression or anxiety Lab Results  Component Value Date   WBC 9.1 06/13/2016   HGB 13.1 06/13/2016   HCT 39.1 06/13/2016   PLT 340.0 06/13/2016   GLUCOSE 97 06/13/2016   CHOL 176 06/13/2016   TRIG 113.0 06/13/2016   HDL 54.50 06/13/2016   LDLDIRECT 173.7 02/20/2011   LDLCALC 99 06/13/2016   ALT 15 06/13/2016   AST 18 06/13/2016   NA 138 06/13/2016   K 4.4 06/13/2016   CL 105 06/13/2016   CREATININE 0.68 06/13/2016   BUN 21 06/13/2016   CO2 26 06/13/2016   TSH 2.81 10/07/2016   HGBA1C 5.7 06/13/2016   BP Readings from Last 3 Encounters:  02/21/17 106/64  10/07/16 106/80  08/02/16 102/70    ASSESSMENT AND PLAN:  Discussed the following assessment and plan:  PANIC DISORDER  Medication management On detailed questioning these do sound like panic and anxiety breakthrough. I doubt if she is having A. fib or arrhythmia attacks or sleep apnea. Discussed options with medications at this time we will switch to fluoxetine because in the past when she was pregnant they seem to help her anxiety. We will do a straight switch from 100 mg of sertraline to 20 mg of fluoxetine with expectant management of transition time 7 days or so. She can follow-up with us in 4-6 weeks.  Her last thyroid check was in range do not expect that to be a problem.  -Patient advised to return or notify health care team  if  new concerns arise.  Patient Instructions  We can switch to fluoxetine or Prozac because you had success with this medicine in the past.  We can try doing a straight switch to 20 mg of fluoxetine once a day and stop the sertraline. Sometimes during transition he may have a little bit  Unsettling for  a week until adjusting to the new medicine. Calendar  Your episodes   Until next visit  Your exam is reassuring today . Find out  What kind of arrhythmia you father  has  . I agree stopping the fish oil for now .     Plan ROV in 4-6 weeks or as needed        Neta MendsWanda K. Jozi Malachi M.D.

## 2017-02-21 ENCOUNTER — Ambulatory Visit (INDEPENDENT_AMBULATORY_CARE_PROVIDER_SITE_OTHER): Payer: 59 | Admitting: Internal Medicine

## 2017-02-21 ENCOUNTER — Encounter: Payer: Self-pay | Admitting: Internal Medicine

## 2017-02-21 VITALS — BP 106/64 | HR 74 | Temp 99.7°F | Wt 160.2 lb

## 2017-02-21 DIAGNOSIS — Z79899 Other long term (current) drug therapy: Secondary | ICD-10-CM | POA: Diagnosis not present

## 2017-02-21 DIAGNOSIS — F410 Panic disorder [episodic paroxysmal anxiety] without agoraphobia: Secondary | ICD-10-CM

## 2017-02-21 MED ORDER — FLUOXETINE HCL 20 MG PO TABS: 20.0000 mg | ORAL_TABLET | Freq: Every day | ORAL | 3 refills | Status: DC

## 2017-02-21 NOTE — Patient Instructions (Addendum)
We can switch to fluoxetine or Prozac because you had success with this medicine in the past.  We can try doing a straight switch to 20 mg of fluoxetine once a day and stop the sertraline. Sometimes during transition he may have a little bit  Unsettling for  a week until adjusting to the new medicine. Calendar  Your episodes   Until next visit  Your exam is reassuring today . Find out  What kind of arrhythmia you father has  . I agree stopping the fish oil for now .     Plan ROV in 4-6 weeks or as needed

## 2017-03-24 ENCOUNTER — Encounter: Payer: Self-pay | Admitting: Internal Medicine

## 2017-03-24 ENCOUNTER — Ambulatory Visit (INDEPENDENT_AMBULATORY_CARE_PROVIDER_SITE_OTHER): Payer: 59 | Admitting: Internal Medicine

## 2017-03-24 VITALS — BP 104/68 | HR 80 | Temp 98.7°F | Wt 164.6 lb

## 2017-03-24 DIAGNOSIS — L509 Urticaria, unspecified: Secondary | ICD-10-CM

## 2017-03-24 DIAGNOSIS — F410 Panic disorder [episodic paroxysmal anxiety] without agoraphobia: Secondary | ICD-10-CM | POA: Diagnosis not present

## 2017-03-24 DIAGNOSIS — E039 Hypothyroidism, unspecified: Secondary | ICD-10-CM | POA: Diagnosis not present

## 2017-03-24 DIAGNOSIS — Z79899 Other long term (current) drug therapy: Secondary | ICD-10-CM | POA: Diagnosis not present

## 2017-03-24 HISTORY — DX: Urticaria, unspecified: L50.9

## 2017-03-24 NOTE — Patient Instructions (Addendum)
Stay on same dose of fluoxetine  And  See how doing in  another month  On  20mg  per day .     And if  Concerns after that then call  Or e mail.  Next step of med would be 30 - 40 mg   But   20 mg usually  A good dose.     If all ok then  Cpx preventive   In     May June.  2019     Ok to refill meds   Until then if needed.     Plan   Allergy referral for   Ongoing chronic hives.   You will be contacted about  This appt.     Hives Hives (urticaria) are itchy, red, swollen areas on your skin. Hives can appear on any part of your body and can vary in size. They can be as small as the tip of a pen or much larger. Hives often fade within 24 hours (acute hives). In other cases, new hives appear after old ones fade. This cycle can continue for several days or weeks (chronic hives). Hives result from your body's reaction to an irritant or to something that you are allergic to (trigger). When you are exposed to a trigger, your body releases a chemical (histamine) that causes redness, itching, and swelling. You can get hives immediately after being exposed to a trigger or hours later. Hives do not spread from person to person (are not contagious). Your hives may get worse with scratching, exercise, and emotional stress. What are the causes? Causes of this condition include:  Allergies to certain foods or ingredients.  Insect bites or stings.  Exposure to pollen or pet dander.  Contact with latex or chemicals.  Spending time in sunlight, heat, or cold (exposure).  Exercise.  Stress.  You can also get hives from some medical conditions and treatments. These include:  Viruses, including the common cold.  Bacterial infections, such as urinary tract infections and strep throat.  Disorders such as vasculitis, lupus, or thyroid disease.  Certain medications.  Allergy shots.  Blood transfusions.  Sometimes, the cause of hives is not known (idiopathic hives). What increases the  risk? This condition is more likely to develop in:  Women.  People who have food allergies, especially to citrus fruits, milk, eggs, peanuts, tree nuts, or shellfish.  People who are allergic to: ? Medicines. ? Latex. ? Insects. ? Animals. ? Pollen.  People who have certain medical conditions, includinglupus or thyroid disease.  What are the signs or symptoms? The main symptom of this condition is raised, itchyred or white bumps or patches on your skin. These areas may:  Become large and swollen (welts).  Change in shape and location, quickly and repeatedly.  Be separate hives or connect over a large area of skin.  Sting or become painful.  Turn white when pressed in the center (blanch).  In severe cases, yourhands, feet, and face may also become swollen. This may occur if hives develop deeper in your skin. How is this diagnosed? This condition is diagnosed based on your symptoms, medical history, and physical exam. Your skin, urine, or blood may be tested to find out what is causing your hives and to rule out other health issues. Your health care provider may also remove a small sample of skin from the affected area and examine it under a microscope (biopsy). How is this treated? Treatment depends on the severity of your condition.  Your health care provider may recommend using cool, wet cloths (cool compresses) or taking cool showers to relieve itching. Hives are sometimes treated with medicines, including:  Antihistamines.  Corticosteroids.  Antibiotics.  An injectable medicine (omalizumab). Your health care provider may prescribe this if you have chronic idiopathic hives and you continue to have symptoms even after treatment with antihistamines.  Severe cases may require an emergency injection of adrenaline (epinephrine) to prevent a life-threatening allergic reaction (anaphylaxis). Follow these instructions at home: Medicines  Take or apply over-the-counter and  prescription medicines only as told by your health care provider.  If you were prescribed an antibiotic medicine, use it as told by your health care provider. Do not stop taking the antibiotic even if you start to feel better. Skin Care  Apply cool compresses to the affected areas.  Do not scratch or rub your skin. General instructions  Do not take hot showers or baths. This can make itching worse.  Do not wear tight-fitting clothing.  Use sunscreen and wear protective clothing when you are outside.  Avoid any substances that cause your hives. Keep a journal to help you track what causes your hives. Write down: ? What medicines you take. ? What you eat and drink. ? What products you use on your skin.  Keep all follow-up visits as told by your health care provider. This is important. Contact a health care provider if:  Your symptoms are not controlled with medicine.  Your joints are painful or swollen. Get help right away if:  You have a fever.  You have pain in your abdomen.  Your tongue or lips are swollen.  Your eyelids are swollen.  Your chest or throat feels tight.  You have trouble breathing or swallowing. These symptoms may represent a serious problem that is an emergency. Do not wait to see if the symptoms will go away. Get medical help right away. Call your local emergency services (911 in the [place] Do not drive yourself to the hospital. This information is not intended to replace advice given to you by your health care provider. Make sure you discuss any questions you have with your health care provider. Document Released: 03/11/2005 Document Revised: 08/09/2015 Document Reviewed: 12/28/2014 Elsevier Interactive Patient Education  2018 [employer]

## 2017-03-24 NOTE — Progress Notes (Signed)
Chief Complaint  Patient presents with  . Follow-up    no new concern. F/u anxiety meds.    HPI:  Karen Fleming 46 y.o. come in for fu med  management  Regarding anxiety  Control   Fu change meds sertraline to fluoxexetine . Thinks its is working better on the 20 mg fluoextine and has twinges short  Sx seconds but no full panic attack   Minor seconds of sx.   Seems to be helping   After 2 weeks   Still has recurring itchy rash  arms legs  On otc antihistamine  Store brand  No resp sx swelling   On going since the summer  And no one else with this   Needs refill synthroid   ROS: See pertinent positives and negatives per HPI.  Past Medical History:  Diagnosis Date  . HELLP (hemolytic anemia/elev liver enzymes/low platelets in pregnancy)    c-section 2007  . Hyperlipidemia   . Hypothyroidism   . Pancreatitis    2000 and 2005  . Panic attacks    on meds pre pregnancy/hx    Family History  Problem Relation Age of Onset  . Heart attack Father        in 92s   . Hyperlipidemia Father   . Hypertension Father        gm and gf  . CVA Father        arrynthmia ? af  age 58   . Throat cancer Father   . Arrhythmia Father   . Diabetes Unknown        gm and gf  . Arthritis Unknown        gp ? OA  . Hyperlipidemia Mother   . CVA Mother   . Colon cancer Neg Hx   . Stomach cancer Neg Hx     Social History   Socioeconomic History  . Marital status: Married    Spouse name: Not on file  . Number of children: Not on file  . Years of education: Not on file  . Highest education level: Not on file  Social Needs  . Financial resource strain: Not on file  . Food insecurity - worry: Not on file  . Food insecurity - inability: Not on file  . Transportation needs - medical: Not on file  . Transportation needs - non-medical: Not on file  Occupational History  . Not on file  Tobacco Use  . Smoking status: Never Smoker  . Smokeless tobacco: Never Used  Substance and Sexual  Activity  . Alcohol use: No  . Drug use: No  . Sexual activity: Not on file  Other Topics Concern  . Not on file  Social History Narrative   HH of 3 pet  Dog     64 yo son child at home [place]   No ets   No caffeine   Drink milk 2-3 x per day    reg exercise    [place]          Outpatient Medications Prior to Visit  Medication Sig Dispense Refill  . atorvastatin (LIPITOR) 10 MG tablet TAKE 1 TABLET BY MOUTH  DAILY 90 tablet 2  . FLUoxetine (PROZAC) 20 MG tablet Take 1 tablet (20 mg total) by mouth daily. 30 tablet 3  . levothyroxine (SYNTHROID, LEVOTHROID) 50 MCG tablet TAKE 1 TABLET BY MOUTH  DAILY 90 tablet 2  . LINZESS 290 MCG CAPS capsule Take 290 mcg by mouth daily as  needed (constipation).   2   No facility-administered medications prior to visit.      EXAM:  BP 104/68 (BP Location: Right Arm, Patient Position: Sitting, Cuff Size: Normal)   Pulse 80   Temp 98.7 F (37.1 C) (Oral)   Wt 164 lb 9.6 oz (74.7 kg)   BMI 30.11 kg/m   Body mass index is 30.11 kg/m.  GENERAL: vitals reviewed and listed above, alert, oriented, appears well hydrated and in no acute distress HEENT: atraumatic, conjunctiva  clear, no obvious abnormalities on inspection of external nose and ears PSYCH: pleasant and cooperative, no obvious depression or anxiety  Skin scattered  Blotchy hives  Legs  Arms   No angioedema  Or swelling   No resp sx.  Lab Results  Component Value Date   WBC 9.1 06/13/2016   HGB 13.1 06/13/2016   HCT 39.1 06/13/2016   PLT 340.0 06/13/2016   GLUCOSE 97 06/13/2016   CHOL 176 06/13/2016   TRIG 113.0 06/13/2016   HDL 54.50 06/13/2016   LDLDIRECT 173.7 02/20/2011   LDLCALC 99 06/13/2016   ALT 15 06/13/2016   AST 18 06/13/2016   NA 138 06/13/2016   K 4.4 06/13/2016   CL 105 06/13/2016   CREATININE 0.68 06/13/2016   BUN 21 06/13/2016   CO2 26 06/13/2016   TSH 2.81 10/07/2016   HGBA1C 5.7 06/13/2016   BP Readings from Last 3 Encounters:    03/24/17 104/68  02/21/17 106/64  10/07/16 106/80    ASSESSMENT AND PLAN:  Discussed the following assessment and plan:  PANIC DISORDER  Medication management  Hives of unknown origin - on going since  summer 18 uncertain cause no major med changes  to explain . no systemixc sx allergy consult referral  - Plan: Ambulatory referral to Allergy  Hypothyroidism, unspecified type Definition of chronic hives without other sx at this time . [;an allergy referral  For opinion    Touch base in a month if   Anxiety problematic  Otherwise   Px and labs in may June   Ok to refill meds until then  -Patient advised to return or notify health care team  if  new concerns arise.  Patient Instructions   Stay on same dose of fluoxetine  And  See how doing in  another month  On  20mg  per day .     And if  Concerns after that then call  Or e mail.  Next step of med would be 30 - 40 mg   But   20 mg usually  A good dose.     If all ok then  Cpx preventive   In     May June.  2019     Ok to refill meds   Until then if needed.     Plan   Allergy referral for   Ongoing chronic hives.   You will be contacted about  This appt.     Hives Hives (urticaria) are itchy, red, swollen areas on your skin. Hives can appear on any part of your body and can vary in size. They can be as small as the tip of a pen or much larger. Hives often fade within 24 hours (acute hives). In other cases, new hives appear after old ones fade. This cycle can continue for several days or weeks (chronic hives). Hives result from your body's reaction to an irritant or to something that you are allergic to (trigger). When you are exposed to  a trigger, your body releases a chemical (histamine) that causes redness, itching, and swelling. You can get hives immediately after being exposed to a trigger or hours later. Hives do not spread from person to person (are not contagious). Your hives may get worse with scratching, exercise,  and emotional stress. What are the causes? Causes of this condition include:  Allergies to certain foods or ingredients.  Insect bites or stings.  Exposure to pollen or pet dander.  Contact with latex or chemicals.  Spending time in sunlight, heat, or cold (exposure).  Exercise.  Stress.  You can also get hives from some medical conditions and treatments. These include:  Viruses, including the common cold.  Bacterial infections, such as urinary tract infections and strep throat.  Disorders such as vasculitis, lupus, or thyroid disease.  Certain medications.  Allergy shots.  Blood transfusions.  Sometimes, the cause of hives is not known (idiopathic hives). What increases the risk? This condition is more likely to develop in:  Women.  People who have food allergies, especially to citrus fruits, milk, eggs, peanuts, tree nuts, or shellfish.  People who are allergic to: ? Medicines. ? Latex. ? Insects. ? Animals. ? Pollen.  People who have certain medical conditions, includinglupus or thyroid disease.  What are the signs or symptoms? The main symptom of this condition is raised, itchyred or white bumps or patches on your skin. These areas may:  Become large and swollen (welts).  Change in shape and location, quickly and repeatedly.  Be separate hives or connect over a large area of skin.  Sting or become painful.  Turn white when pressed in the center (blanch).  In severe cases, yourhands, feet, and face may also become swollen. This may occur if hives develop deeper in your skin. How is this diagnosed? This condition is diagnosed based on your symptoms, medical history, and physical exam. Your skin, urine, or blood may be tested to find out what is causing your hives and to rule out other health issues. Your health care provider may also remove a small sample of skin from the affected area and examine it under a microscope (biopsy). How is this  treated? Treatment depends on the severity of your condition. Your health care provider may recommend using cool, wet cloths (cool compresses) or taking cool showers to relieve itching. Hives are sometimes treated with medicines, including:  Antihistamines.  Corticosteroids.  Antibiotics.  An injectable medicine (omalizumab). Your health care provider may prescribe this if you have chronic idiopathic hives and you continue to have symptoms even after treatment with antihistamines.  Severe cases may require an emergency injection of adrenaline (epinephrine) to prevent a life-threatening allergic reaction (anaphylaxis). Follow these instructions at home: Medicines  Take or apply over-the-counter and prescription medicines only as told by your health care provider.  If you were prescribed an antibiotic medicine, use it as told by your health care provider. Do not stop taking the antibiotic even if you start to feel better. Skin Care  Apply cool compresses to the affected areas.  Do not scratch or rub your skin. General instructions  Do not take hot showers or baths. This can make itching worse.  Do not wear tight-fitting clothing.  Use sunscreen and wear protective clothing when you are outside.  Avoid any substances that cause your hives. Keep a journal to help you track what causes your hives. Write down: ? What medicines you take. ? What you eat and drink. ? What products you use  on your skin.  Keep all follow-up visits as told by your health care provider. This is important. Contact a health care provider if:  Your symptoms are not controlled with medicine.  Your joints are painful or swollen. Get help right away if:  You have a fever.  You have pain in your abdomen.  Your tongue or lips are swollen.  Your eyelids are swollen.  Your chest or throat feels tight.  You have trouble breathing or swallowing. These symptoms may represent a serious problem that is an  emergency. Do not wait to see if the symptoms will go away. Get medical help right away. Call your local emergency services (911 in the [place] Do not drive yourself to the hospital. This information is not intended to replace advice given to you by your health care provider. Make sure you discuss any questions you have with your health care provider. Document Released: 03/11/2005 Document Revised: 08/09/2015 Document Reviewed: 12/28/2014 Elsevier Interactive Patient Education  2018 [employer]        HannaWanda K. Arlett Goold M.D.

## 2017-03-25 DIAGNOSIS — A048 Other specified bacterial intestinal infections: Secondary | ICD-10-CM

## 2017-03-25 HISTORY — DX: Other specified bacterial intestinal infections: A04.8

## 2017-04-15 ENCOUNTER — Other Ambulatory Visit: Payer: Self-pay | Admitting: Internal Medicine

## 2017-04-18 ENCOUNTER — Other Ambulatory Visit: Payer: Self-pay | Admitting: Internal Medicine

## 2017-06-29 ENCOUNTER — Other Ambulatory Visit: Payer: Self-pay | Admitting: Internal Medicine

## 2017-09-09 ENCOUNTER — Other Ambulatory Visit: Payer: Self-pay | Admitting: Internal Medicine

## 2017-11-19 ENCOUNTER — Encounter: Payer: Self-pay | Admitting: Internal Medicine

## 2017-11-19 ENCOUNTER — Ambulatory Visit (INDEPENDENT_AMBULATORY_CARE_PROVIDER_SITE_OTHER): Payer: 59 | Admitting: Internal Medicine

## 2017-11-19 VITALS — BP 98/54 | HR 60 | Temp 98.2°F | Wt 152.0 lb

## 2017-11-19 DIAGNOSIS — E039 Hypothyroidism, unspecified: Secondary | ICD-10-CM

## 2017-11-19 DIAGNOSIS — R05 Cough: Secondary | ICD-10-CM

## 2017-11-19 DIAGNOSIS — R053 Chronic cough: Secondary | ICD-10-CM

## 2017-11-19 DIAGNOSIS — R1011 Right upper quadrant pain: Secondary | ICD-10-CM | POA: Diagnosis not present

## 2017-11-19 DIAGNOSIS — K589 Irritable bowel syndrome without diarrhea: Secondary | ICD-10-CM | POA: Insufficient documentation

## 2017-11-19 LAB — CBC WITH DIFFERENTIAL/PLATELET
Basophils Absolute: 0.1 10*3/uL (ref 0.0–0.1)
Basophils Relative: 0.7 % (ref 0.0–3.0)
Eosinophils Absolute: 0.1 10*3/uL (ref 0.0–0.7)
Eosinophils Relative: 1.7 % (ref 0.0–5.0)
HCT: 38.5 % (ref 36.0–46.0)
Hemoglobin: 13 g/dL (ref 12.0–15.0)
Lymphocytes Relative: 22.1 % (ref 12.0–46.0)
Lymphs Abs: 1.9 10*3/uL (ref 0.7–4.0)
MCHC: 33.8 g/dL (ref 30.0–36.0)
MCV: 93.3 fl (ref 78.0–100.0)
Monocytes Absolute: 0.7 10*3/uL (ref 0.1–1.0)
Monocytes Relative: 8.2 % (ref 3.0–12.0)
Neutro Abs: 5.8 10*3/uL (ref 1.4–7.7)
Neutrophils Relative %: 67.3 % (ref 43.0–77.0)
Platelets: 330 10*3/uL (ref 150.0–400.0)
RBC: 4.13 Mil/uL (ref 3.87–5.11)
RDW: 12.7 % (ref 11.5–15.5)
WBC: 8.6 10*3/uL (ref 4.0–10.5)

## 2017-11-19 LAB — BASIC METABOLIC PANEL
BUN: 14 mg/dL (ref 6–23)
CO2: 23 meq/L (ref 19–32)
Calcium: 9.3 mg/dL (ref 8.4–10.5)
Chloride: 105 mEq/L (ref 96–112)
Creatinine, Ser: 0.71 mg/dL (ref 0.40–1.20)
GFR: 93.33 mL/min (ref >60.00)
GLUCOSE: 91 mg/dL (ref 70–99)
POTASSIUM: 4.6 meq/L (ref 3.5–5.1)
Sodium: 138 mEq/L (ref 135–145)

## 2017-11-19 LAB — POC URINALSYSI DIPSTICK (AUTOMATED)
Bilirubin, UA: NEGATIVE
Blood, UA: POSITIVE
Glucose, UA: NEGATIVE
Ketones, UA: NEGATIVE
Leukocytes, UA: NEGATIVE
Nitrite, UA: NEGATIVE
Protein, UA: NEGATIVE
Spec Grav, UA: 1.025
Urobilinogen, UA: 0.2 U/dL
pH, UA: 5.5

## 2017-11-19 LAB — HEPATIC FUNCTION PANEL
ALBUMIN: 4.4 g/dL (ref 3.5–5.2)
ALT: 13 U/L (ref 0–35)
AST: 20 U/L (ref 0–37)
Alkaline Phosphatase: 78 U/L (ref 39–117)
BILIRUBIN DIRECT: 0.1 mg/dL (ref 0.0–0.3)
TOTAL PROTEIN: 7.3 g/dL (ref 6.0–8.3)
Total Bilirubin: 0.8 mg/dL (ref 0.2–1.2)

## 2017-11-19 LAB — TSH: TSH: 0.88 u[IU]/mL (ref 0.35–4.50)

## 2017-11-19 LAB — POCT URINE PREGNANCY: Preg Test, Ur: NEGATIVE

## 2017-11-19 NOTE — Progress Notes (Signed)
Chief Complaint  Patient presents with  . Abdominal Pain    RUQ pain x 2-3 weeks. Worse with fatty foods and when drinking water on an empty stomach. Pt c/o a "heavy feeling" in her RUQ around her gallbladder, no radiating pain. Pain goes away but when she eats it will return. Denies N/V/D/C, some nausea in morning upon awakening. No h/o gallbladder issues.     HPI: Karen Fleming 47 y.o. come in for sda    3 weeks waxing and waning  ruq pain and rad to side back    Without fever vomiting diarrhea   Occurs  depending on when eating  Nausea   Bu no v  No meds  Passes and better with low fat diet     On going upper  Airway? Cough  Without fever osb  Cough  Better after at beach    And returned when came back home   Hive s in past.   takse ocass zyrtec  No cp sog hemoptysis ( not assoc with above) ROS: See pertinent positives and negatives per HPI. lmp 2-3 weeks ago  Husband uses condomes denies this is pregnancy   Past Medical History:  Diagnosis Date  . HELLP (hemolytic anemia/elev liver enzymes/low platelets in pregnancy)    c-section 2007  . Hyperlipidemia   . Hypothyroidism   . Pancreatitis    2000 and 2005  . Panic attacks    on meds pre pregnancy/hx    Family History  Problem Relation Age of Onset  . Heart attack Father        in 37s   . Hyperlipidemia Father   . Hypertension Father        gm and gf  . CVA Father        arrynthmia ? af  age 22   . Throat cancer Father   . Arrhythmia Father   . Diabetes Unknown        gm and gf  . Arthritis Unknown        gp ? OA  . Hyperlipidemia Mother   . CVA Mother   . Colon cancer Neg Hx   . Stomach cancer Neg Hx     Social History   Socioeconomic History  . Marital status: Married    Spouse name: Not on file  . Number of children: Not on file  . Years of education: Not on file  . Highest education level: Not on file  Occupational History  . Not on file  Social Needs  . Financial resource strain: Not on file  .  Food insecurity:    Worry: Not on file    Inability: Not on file  . Transportation needs:    Medical: Not on file    Non-medical: Not on file  Tobacco Use  . Smoking status: Never Smoker  . Smokeless tobacco: Never Used  Substance and Sexual Activity  . Alcohol use: No  . Drug use: No  . Sexual activity: Not on file  Lifestyle  . Physical activity:    Days per week: Not on file    Minutes per session: Not on file  . Stress: Not on file  Relationships  . Social connections:    Talks on phone: Not on file    Gets together: Not on file    Attends religious service: Not on file    Active member of club or organization: Not on file    Attends meetings of clubs or organizations: Not  on file    Relationship status: Not on file  Other Topics Concern  . Not on file  Social History Narrative   HH of 3 pet  Dog     909 yo son child at home [place]   No ets   No caffeine   Drink milk 2-3 x per day    reg exercise    [place]          Outpatient Medications Prior to Visit  Medication Sig Dispense Refill  . atorvastatin (LIPITOR) 10 MG tablet TAKE 1 TABLET BY MOUTH  DAILY 90 tablet 0  . FLUoxetine (PROZAC) 20 MG tablet TAKE 1 TABLET(20 MG) BY MOUTH DAILY 90 tablet 3  . levothyroxine (SYNTHROID, LEVOTHROID) 50 MCG tablet TAKE 1 TABLET BY MOUTH  DAILY 90 tablet 0  . LINZESS 290 MCG CAPS capsule Take 290 mcg by mouth daily as needed (constipation).   2   No facility-administered medications prior to visit.      EXAM:  BP (!) 98/54 (BP Location: Right Arm, Patient Position: Sitting, Cuff Size: Normal)   Pulse 60   Temp 98.2 F (36.8 C) (Oral)   Wt 152 lb (68.9 kg)   BMI 27.80 kg/m   Body mass index is 27.8 kg/m.  GENERAL: vitals reviewed and listed above, alert, oriented, appears well hydrated and in no acute distress HEENT: atraumatic, conjunctiva  clear, no obvious abnormalities on inspection of external nose and ears OP : no lesion edema or exudate  NECK:  no obvious masses on inspection palpation  LUNGS: clear to auscultation bilaterally, no wheezes, rales or rhonchi, good air movement CV: HRRR, no clubbing cyanosis or  peripheral edema nl cap refill  Abdomen:  Sof,t normal bowel sounds without hepatosplenomegaly, no guarding rebound or masses no CVA tenderness  Areas of concern ruq and lateral  MS: moves all extremities without noticeable focal  abnormality PSYCH: pleasant and cooperative, no obvious depression or anxiety Lab Results  Component Value Date   WBC 9.1 06/13/2016   HGB 13.1 06/13/2016   HCT 39.1 06/13/2016   PLT 340.0 06/13/2016   GLUCOSE 97 06/13/2016   CHOL 176 06/13/2016   TRIG 113.0 06/13/2016   HDL 54.50 06/13/2016   LDLDIRECT 173.7 02/20/2011   LDLCALC 99 06/13/2016   ALT 15 06/13/2016   AST 18 06/13/2016   NA 138 06/13/2016   K 4.4 06/13/2016   CL 105 06/13/2016   CREATININE 0.68 06/13/2016   BUN 21 06/13/2016   CO2 26 06/13/2016   TSH 2.81 10/07/2016   HGBA1C 5.7 06/13/2016   BP Readings from Last 3 Encounters:  11/19/17 (!) 98/54  03/24/17 104/68  02/21/17 106/64   Wt Readings from Last 3 Encounters:  11/19/17 152 lb (68.9 kg)  03/24/17 164 lb 9.6 oz (74.7 kg)  02/21/17 160 lb 3.2 oz (72.7 kg)    ASSESSMENT AND PLAN:  Discussed the following assessment and plan:  Right upper quadrant abdominal pain - Plan: CBC with Differential/Platelet, Hepatic function panel, TSH, POCT Urinalysis Dipstick (Automated), Basic metabolic panel, US Abdomen Complete, POCT urine pregnancy  Cough, persistent - Plan: CBC with Differential/Platelet, Hepatic function panel, TSH, POCT Urinalysis Dipstick (Automated), Basic metabolic panel  Hypothyroidism, unspecified type - Plan: CBC with Differential/Platelet, Hepatic function panel, TSH, POCT Urinalysis Dipstick (Automated), Basic metabolic panel Consideration of GB disease   Or such  getl labs today and us abd and then go from there  Try antihitamine as needed for  cough  Not  high risk consider repeat  X ray if needed ? If allergic cough . Not felt to be related to the  RUQ pain today  Get moiniotinr labs today for thyroid as  Due.  -Patient advised to return or notify health care team  if  new concerns arise.  Patient Instructions  Checking for gall bladder issues  Blood tests today .  For now avoid fatty foods . Can try  prilosec for 1-2 weeks and see if helps .,  Cough could be allergic based on your context  Exam is reassuring today .   Fu depending on the results    Contact us if worse in the interim .   Low-Fat Diet for Pancreatitis or Gallbladder Conditions A low-fat diet can be helpful if you have pancreatitis or a gallbladder condition. With these conditions, your pancreas and gallbladder have trouble digesting fats. A healthy eating plan with less fat will help rest your pancreas and gallbladder and reduce your symptoms. What do I need to know about this diet?  Eat a low-fat diet. ? Reduce your fat intake to less than 20-30% of your total daily calories. This is less than 50-60 g of fat per day. ? Remember that you need some fat in your diet. Ask your dietician what your daily goal should be. ? Choose nonfat and low-fat healthy foods. Look for the words "nonfat," "low fat," or "fat free." ? As a guide, look on the label and choose foods with less than 3 g of fat per serving. Eat only one serving.  Avoid alcohol.  Do not smoke. If you need help quitting, talk with your health care provider.  Eat small frequent meals instead of three large heavy meals. What foods can I eat? Grains Include healthy grains and starches such as potatoes, wheat bread, fiber-rich cereal, and brown rice. Choose whole grain options whenever possible. In adults, whole grains should account for 45-65% of your daily calories. Fruits and Vegetables Eat plenty of fruits and vegetables. Fresh fruits and vegetables add fiber to your diet. Meats and Other Protein  Sources Eat lean meat such as chicken and pork. Trim any fat off of meat before cooking it. Eggs, fish, and beans are other sources of protein. In adults, these foods should account for 10-35% of your daily calories. Dairy Choose low-fat milk and dairy options. Dairy includes fat and protein, as well as calcium. Fats and Oils Limit high-fat foods such as fried foods, sweets, baked goods, sugary drinks. Other Creamy sauces and condiments, such as mayonnaise, can add extra fat. Think about whether or not you need to use them, or use smaller amounts or low fat options. What foods are not recommended?  High fat foods, such as: ? [employer]. ? Ice cream. ? [place] toast. ? Sweet rolls. ? Pizza. ? Cheese bread. ? Foods covered with batter, butter, creamy sauces, or cheese. ? Fried foods. ? Sugary drinks and desserts.  Foods that cause gas or bloating This information is not intended to replace advice given to you by your health care provider. Make sure you discuss any questions you have with your health care provider. Document Released: 03/16/2013 Document Revised: 08/17/2015 Document Reviewed: 02/22/2013 Elsevier Interactive Patient Education  2017 [employer].     Barney. Panosh M.D.

## 2017-11-19 NOTE — Patient Instructions (Addendum)
Checking for gall bladder issues  Blood tests today .  For now avoid fatty foods . Can try  prilosec for 1-2 weeks and see if helps .,  Cough could be allergic based on your context  Exam is reassuring today .   Fu depending on the results    Contact us if worse in the interim .   Low-Fat Diet for Pancreatitis or Gallbladder Conditions A low-fat diet can be helpful if you have pancreatitis or a gallbladder condition. With these conditions, your pancreas and gallbladder have trouble digesting fats. A healthy eating plan with less fat will help rest your pancreas and gallbladder and reduce your symptoms. What do I need to know about this diet?  Eat a low-fat diet. ? Reduce your fat intake to less than 20-30% of your total daily calories. This is less than 50-60 g of fat per day. ? Remember that you need some fat in your diet. Ask your dietician what your daily goal should be. ? Choose nonfat and low-fat healthy foods. Look for the words "nonfat," "low fat," or "fat free." ? As a guide, look on the label and choose foods with less than 3 g of fat per serving. Eat only one serving.  Avoid alcohol.  Do not smoke. If you need help quitting, talk with your health care provider.  Eat small frequent meals instead of three large heavy meals. What foods can I eat? Grains Include healthy grains and starches such as potatoes, wheat bread, fiber-rich cereal, and brown rice. Choose whole grain options whenever possible. In adults, whole grains should account for 45-65% of your daily calories. Fruits and Vegetables Eat plenty of fruits and vegetables. Fresh fruits and vegetables add fiber to your diet. Meats and Other Protein Sources Eat lean meat such as chicken and pork. Trim any fat off of meat before cooking it. Eggs, fish, and beans are other sources of protein. In adults, these foods should account for 10-35% of your daily calories. Dairy Choose low-fat milk and dairy options. Dairy includes  fat and protein, as well as calcium. Fats and Oils Limit high-fat foods such as fried foods, sweets, baked goods, sugary drinks. Other Creamy sauces and condiments, such as mayonnaise, can add extra fat. Think about whether or not you need to use them, or use smaller amounts or low fat options. What foods are not recommended?  High fat foods, such as: ? [employer] goods. ? Ice cream. ? [place] toast. ? Sweet rolls. ? Pizza. ? Cheese bread. ? Foods covered with batter, butter, creamy sauces, or cheese. ? Fried foods. ? Sugary drinks and desserts.  Foods that cause gas or bloating This information is not intended to replace advice given to you by your health care provider. Make sure you discuss any questions you have with your health care provider. Document Released: 03/16/2013 Document Revised: 08/17/2015 Document Reviewed: 02/22/2013 Elsevier Interactive Patient Education  2017 [employer]

## 2017-11-20 ENCOUNTER — Telehealth: Payer: Self-pay | Admitting: Internal Medicine

## 2017-11-20 ENCOUNTER — Other Ambulatory Visit: Payer: Self-pay | Admitting: Internal Medicine

## 2017-11-20 NOTE — Telephone Encounter (Signed)
Charted in result notes. 

## 2017-11-20 NOTE — Telephone Encounter (Signed)
Copied from CRM [phone]. Topic: Quick Communication - Lab Results >> Nov 20, 2017  3:49 PM Maisie FusGreen, Ashtyn M, CMA wrote: Called patient to inform them of 11/19/17 lab results. When patient returns call, triage nurse may disclose results. >> Nov 20, 2017  4:42 PM Arlyss Gandyichardson, Johnmatthew Solorio N, NT wrote: Pt calling back to get lab results.

## 2017-12-05 ENCOUNTER — Ambulatory Visit
Admission: RE | Admit: 2017-12-05 | Discharge: 2017-12-05 | Disposition: A | Discharge status: Home / Self Care | Payer: 59 | Source: Ambulatory Visit | Attending: Internal Medicine | Admitting: Internal Medicine

## 2017-12-05 DIAGNOSIS — R1011 Right upper quadrant pain: Secondary | ICD-10-CM

## 2018-03-31 ENCOUNTER — Other Ambulatory Visit (HOSPITAL_COMMUNITY): Payer: Self-pay | Admitting: Gastroenterology

## 2018-04-08 ENCOUNTER — Encounter (HOSPITAL_COMMUNITY)
Admission: RE | Admit: 2018-04-08 | Discharge: 2018-04-08 | Disposition: A | Discharge status: Home / Self Care | Payer: 59 | Source: Ambulatory Visit | Attending: Gastroenterology | Admitting: Gastroenterology

## 2018-04-08 DIAGNOSIS — R1011 Right upper quadrant pain: Secondary | ICD-10-CM | POA: Insufficient documentation

## 2018-04-08 DIAGNOSIS — K829 Disease of gallbladder, unspecified: Secondary | ICD-10-CM | POA: Insufficient documentation

## 2018-04-08 MED ORDER — TECHNETIUM TC 99M MEBROFENIN IV KIT
5.0000 | PACK | Freq: Once | INTRAVENOUS | Status: AC | PRN
  Administered 2018-04-08: 5 via INTRAVENOUS

## 2018-04-10 ENCOUNTER — Other Ambulatory Visit: Payer: Self-pay | Admitting: Internal Medicine

## 2018-04-20 ENCOUNTER — Encounter: Payer: Self-pay | Admitting: Internal Medicine

## 2018-04-20 ENCOUNTER — Other Ambulatory Visit: Payer: Self-pay | Admitting: Gastroenterology

## 2018-04-20 LAB — HM COLONOSCOPY

## 2018-04-21 ENCOUNTER — Other Ambulatory Visit: Payer: Self-pay | Admitting: Internal Medicine

## 2018-04-22 NOTE — Telephone Encounter (Signed)
Last filled : 04/18/17 Last Ov:11/19/17

## 2018-04-27 ENCOUNTER — Ambulatory Visit
Admission: RE | Admit: 2018-04-27 | Discharge: 2018-04-27 | Disposition: A | Discharge status: Home / Self Care | Payer: 59 | Source: Ambulatory Visit | Attending: Gastroenterology | Admitting: Gastroenterology

## 2018-04-27 ENCOUNTER — Encounter: Payer: Self-pay | Admitting: Radiology

## 2018-04-27 ENCOUNTER — Telehealth: Payer: Self-pay

## 2018-04-27 MED ORDER — IOPAMIDOL (ISOVUE-300) INJECTION 61%
100.0000 mL | Freq: Once | INTRAVENOUS | Status: AC | PRN
  Administered 2018-04-27: 100 mL via INTRAVENOUS

## 2018-04-27 NOTE — Telephone Encounter (Signed)
Surgical pathology report from 04/20/2018 are in red folder for you to review from endoscopy and colonoscopy please advise

## 2018-04-29 NOTE — Telephone Encounter (Signed)
I see report positive for h pylori   I assume she has been put on medication for treatment?  For this  Please make sure that her Gi send [place] copies of treatment notes .  And let us know if any thing else we can do to help

## 2018-04-29 NOTE — Telephone Encounter (Signed)
Pt states that she talked to nurse of Dr.Manns and states that they sent in medication for her and has follow up with them

## 2018-04-29 NOTE — Telephone Encounter (Signed)
lvm for pt to call back okay for nurse triage to disclose info

## 2018-04-29 NOTE — Telephone Encounter (Signed)
Patient returning call.

## 2018-04-29 NOTE — Telephone Encounter (Signed)
Patient called, left VM to return call back to the office. 

## 2018-04-29 NOTE — Telephone Encounter (Signed)
Pt returned call and information per notes of Dr. Fabian Sharp on 04/29/18 relayed to pt. Pt states she has not been put on any medication by Dr. Loreta Ave, her GI doctor. Pt states she spoke with her GI doctor approximately 2 days ago and was told that her CT scan was normal, but nothing was recommended to her regarding H. Pylori test being positive. Pt wants to know what the next steps would be at this point and wants to know if she needs to call Dr. Loreta Ave or if Dr. Fabian Sharp well provide treatment for H. Pylori.

## 2018-05-21 ENCOUNTER — Encounter: Payer: Self-pay | Admitting: Internal Medicine

## 2018-09-28 ENCOUNTER — Other Ambulatory Visit: Payer: Self-pay | Admitting: Internal Medicine

## 2018-10-13 ENCOUNTER — Other Ambulatory Visit: Payer: Self-pay

## 2018-10-13 MED ORDER — FLUOXETINE HCL 20 MG PO CAPS: 20.0000 mg | ORAL_CAPSULE | Freq: Every day | ORAL | 1 refills | Status: DC

## 2018-11-13 ENCOUNTER — Other Ambulatory Visit: Payer: Self-pay | Admitting: Internal Medicine

## 2018-11-13 NOTE — Telephone Encounter (Signed)
See request °

## 2018-11-13 NOTE — Telephone Encounter (Signed)
Medication Refill - Medication: atorvastatin (LIPITOR) 10 MG tablet  Has the patient contacted their pharmacy? Yes - needs to go in as [facility] not name brand (Agent: If no, request that the patient contact the pharmacy for the refill.) (Agent: If yes, when and what did the pharmacy advise?)  Preferred Pharmacy (with phone number or street name):  [place] [place] [phone] (Phone) [phone] (Fax)   Agent: Please be advised that RX refills may take up to 3 business days. We ask that you follow-up with your pharmacy.

## 2018-11-13 NOTE — Telephone Encounter (Signed)
Requested medication (s) are due for refill today:yes  Requested medication (s) are on the active medication list: yes  Last refill:  06/30/17  Future visit scheduled: No, needs appointment  Notes to clinic:  Labs all from 2018    Requested Prescriptions  Pending Prescriptions Disp Refills   atorvastatin (LIPITOR) 10 MG tablet 90 tablet 0    Sig: Take 1 tablet (10 mg total) by mouth daily.     Cardiovascular:  Antilipid - Statins Failed - 11/13/2018  2:28 PM      Failed - Total Cholesterol in normal range and within 360 days    Cholesterol  Date Value Ref Range Status  06/13/2016 176 0 - 200 mg/dL Final    Comment:    ATP III Classification       Desirable:  < 200 mg/dL               Borderline High:  200 - 239 mg/dL          High:  > = 240 mg/dL         Failed - LDL in normal range and within 360 days    LDL Cholesterol  Date Value Ref Range Status  06/13/2016 99 0 - 99 mg/dL Final         Failed - HDL in normal range and within 360 days    HDL  Date Value Ref Range Status  06/13/2016 54.50 >39.00 mg/dL Final         Failed - Triglycerides in normal range and within 360 days    Triglycerides  Date Value Ref Range Status  06/13/2016 113.0 0.0 - 149.0 mg/dL Final    Comment:    Normal:  <150 mg/dLBorderline High:  150 - 199 mg/dL         Failed - Valid encounter within last 12 months    Recent Outpatient Visits          11 months ago Right upper quadrant abdominal pain   [place] at [facility], Standley Brooking, MD   1 year ago [place] at [facility], Standley Brooking, MD   1 year ago [place] at [facility], Standley Brooking, MD   2 years ago Visit for preventive health examination   [place] at [facility], Standley Brooking, MD   2 years ago [place] at [place], Standley Brooking, MD             Passed - Patient is not pregnant

## 2018-11-27 NOTE — Telephone Encounter (Signed)
Please write her a letter that says    Please excuse Shawntel Farnworth from her gym membership for medical reasons until further notice .    WP

## 2018-12-04 ENCOUNTER — Telehealth: Payer: Self-pay | Admitting: Internal Medicine

## 2018-12-04 NOTE — Telephone Encounter (Signed)
Medication: atorvastatin (LIPITOR) 10 MG tablet [[id number]] , levothyroxine (SYNTHROID) 50 MCG tablet [[id number]]   Has the patient contacted their pharmacy? Yes  (Agent: If no, request that the patient contact the pharmacy for the refill.) (Agent: If yes, when and what did the pharmacy advise?)  Preferred Pharmacy (with phone number or street name): [facility] [place], [place] - [address] [place] [address] [place] [address] [place] [address] [phone] (Phone) [phone] (Fax)    Agent: Please be advised that RX refills may take up to 3 business days. We ask that you follow-up with your pharmacy.

## 2018-12-07 MED ORDER — ATORVASTATIN CALCIUM 10 MG PO TABS: 10.0000 mg | ORAL_TABLET | Freq: Every day | ORAL | 0 refills | Status: DC

## 2018-12-07 MED ORDER — LEVOTHYROXINE SODIUM 50 MCG PO TABS: 50.0000 ug | ORAL_TABLET | Freq: Every day | ORAL | 0 refills | Status: DC

## 2018-12-07 NOTE — Telephone Encounter (Signed)
This has already been sent in through refill request

## 2018-12-23 ENCOUNTER — Other Ambulatory Visit: Payer: Self-pay | Admitting: Internal Medicine

## 2018-12-24 ENCOUNTER — Encounter: Payer: Self-pay | Admitting: Internal Medicine

## 2019-01-02 ENCOUNTER — Other Ambulatory Visit: Payer: Self-pay | Admitting: Internal Medicine

## 2019-01-04 ENCOUNTER — Encounter: Payer: Self-pay | Admitting: Internal Medicine

## 2019-01-04 ENCOUNTER — Other Ambulatory Visit: Payer: Self-pay

## 2019-01-04 ENCOUNTER — Telehealth (INDEPENDENT_AMBULATORY_CARE_PROVIDER_SITE_OTHER): Payer: 59 | Admitting: Internal Medicine

## 2019-01-04 DIAGNOSIS — Z79899 Other long term (current) drug therapy: Secondary | ICD-10-CM

## 2019-01-04 DIAGNOSIS — E039 Hypothyroidism, unspecified: Secondary | ICD-10-CM

## 2019-01-04 DIAGNOSIS — Z0000 Encounter for general adult medical examination without abnormal findings: Secondary | ICD-10-CM

## 2019-01-04 DIAGNOSIS — F410 Panic disorder [episodic paroxysmal anxiety] without agoraphobia: Secondary | ICD-10-CM | POA: Diagnosis not present

## 2019-01-04 DIAGNOSIS — E785 Hyperlipidemia, unspecified: Secondary | ICD-10-CM

## 2019-01-04 MED ORDER — FLUOXETINE HCL 20 MG PO CAPS: 20.0000 mg | ORAL_CAPSULE | Freq: Every day | ORAL | 1 refills | Status: DC

## 2019-01-04 NOTE — Telephone Encounter (Signed)
Sent mychart message  To patient to schedule a office visit after not being able to leave voicemail on phone

## 2019-01-04 NOTE — Progress Notes (Signed)
Virtual Visit via Video Note  I connected with@ on 01/04/19 at 11:30 AM EDT by a video enabled telemedicine application and verified that I am speaking with the correct person using two identifiers. Location patient: home Location provider:workoffice Persons participating in the virtual visit: patient, provider  [place] national recommendations  regarding COVID 19 pandemic   video visit is advised over in office visit for this patient.  Patient aware  of the limitations of evaluation and management by telemedicine and  availability of in person appointments. and agreed to proceed.   HPI: Karen Fleming presents for video visit for medication refill  sonce last visit   cpx over a year ago she haws been rx for h pylori and her go s and abd pain is better  Not on med vor this   Fluoxetine 20 mg per day has helped her anxiety and panic and would like to continue this   Thyroid: same dosing   hld on atovastatin  At home and family is well.  ROS: See pertinent positives and negatives per HPI.  Past Medical History:  Diagnosis Date  . H. pylori infection 2019   Per dr Loreta Ave  . HELLP (hemolytic anemia/elev liver enzymes/low platelets in pregnancy)    c-section 2007  . Hyperlipidemia   . Hypothyroidism   . Pancreatitis    2000 and 2005  . Panic attacks    on meds pre pregnancy/hx    Past Surgical History:  Procedure Laterality Date  . APPENDECTOMY  2001  . CESAREAN SECTION  2007    Family History  Problem Relation Age of Onset  . Heart attack Father        in 82s   . Hyperlipidemia Father   . Hypertension Father        gm and gf  . CVA Father        arrynthmia ? af  age 55   . Throat cancer Father   . Arrhythmia Father   . Diabetes Unknown        gm and gf  . Arthritis Unknown        gp ? OA  . Hyperlipidemia Mother   . CVA Mother   . Colon cancer Neg Hx   . Stomach cancer Neg Hx     Social History   Tobacco Use  . Smoking status: Never Smoker  . Smokeless  tobacco: Never Used  Substance Use Topics  . Alcohol use: No  . Drug use: No      Current Outpatient Medications:  .  atorvastatin (LIPITOR) 10 MG tablet, Take 1 tablet (10 mg total) by mouth daily., Disp: 90 tablet, Rfl: 0 .  FLUoxetine (PROZAC) 20 MG capsule, Take 1 capsule (20 mg total) by mouth daily., Disp: 90 capsule, Rfl: 1 .  levothyroxine (SYNTHROID) 50 MCG tablet, Take 1 tablet (50 mcg total) by mouth daily., Disp: 90 tablet, Rfl: 0  EXAM: BP Readings from Last 3 Encounters:  11/19/17 (!) 98/54  03/24/17 104/68  02/21/17 106/64    VITALS per patient if applicable:  GENERAL: alert, oriented, appears well and in no acute distress  HEENT: atraumatic, conjunttiva clear, no obvious abnormalities on inspection of external nose and ears  NECK: normal movements of the head and neck  LUNGS: on inspection no signs of respiratory distress, breathing rate appears normal, no obvious gross SOB, gasping or wheezing  CV: no obvious cyanosis  MS: moves all visible extremities without noticeable abnormality  PSYCH/NEURO: pleasant and cooperative, no  obvious depression or anxiety, speech and thought processing grossly intact Lab Results  Component Value Date   WBC 8.6 11/19/2017   HGB 13.0 11/19/2017   HCT 38.5 11/19/2017   PLT 330.0 11/19/2017   GLUCOSE 91 11/19/2017   CHOL 176 06/13/2016   TRIG 113.0 06/13/2016   HDL 54.50 06/13/2016   LDLDIRECT 173.7 02/20/2011   LDLCALC 99 06/13/2016   ALT 13 11/19/2017   AST 20 11/19/2017   NA 138 11/19/2017   K 4.6 11/19/2017   CL 105 11/19/2017   CREATININE 0.71 11/19/2017   BUN 14 11/19/2017   CO2 23 11/19/2017   TSH 0.88 11/19/2017   HGBA1C 5.7 06/13/2016    ASSESSMENT AND PLAN:  Discussed the following assessment and plan:    ICD-10-CM   1. Medication management  K48.185 Basic metabolic panel    CBC with Differential/Platelet    Hepatic function panel    Lipid panel    TSH  2. PANIC DISORDER  F41.0    controlled  doing well on current med  refill med  3. Hypothyroidism, unspecified type  U31.4 Basic metabolic panel    CBC with Differential/Platelet    Hepatic function panel    Lipid panel    TSH  4. Hyperlipidemia, unspecified hyperlipidemia type  H70.2 Basic metabolic panel    CBC with Differential/Platelet    Hepatic function panel    Lipid panel    TSH  5. lab for preventive health visit  O37.85 Basic metabolic panel    CBC with Differential/Platelet    Hepatic function panel    Lipid panel    TSH   Due for lab monitoring  cpx    Counseled.   Expectant management and discussion of plan and treatment with opportunity to ask questions and all were answered. The patient agreed with the plan and demonstrated an understanding of the instructions.   Advised to call back or seek an in-person evaluation if worsening  or having  further concerns .  Return for cpx with labs pre visit  orders placed .   Shanon Ace, MD

## 2019-01-08 ENCOUNTER — Other Ambulatory Visit: Payer: Self-pay | Admitting: Internal Medicine

## 2019-02-24 ENCOUNTER — Other Ambulatory Visit: Payer: Self-pay | Admitting: Internal Medicine

## 2019-03-01 ENCOUNTER — Other Ambulatory Visit: Payer: Self-pay

## 2019-03-01 NOTE — Progress Notes (Signed)
This visit occurred during the SARS-CoV-2 public health emergency.  Safety protocols were in place, including screening questions prior to the visit, additional usage of staff PPE, and extensive cleaning of exam room while observing appropriate contact time as indicated for disinfecting solutions.    Chief Complaint  Patient presents with  . Annual Exam    Pt has no concerns today     HPI: Patient  Karen Fleming  48 y.o. comes in today for Preventive Health Care visit  Thyroid  Taking med daily HLD med without problem Anxiety on fluoxetine and doing well would like to continue. having some adults acne on lower face with some  Pigment changes  No felt to be mask or topical related as dosent wear makeup  No hx of same and no change body hair. Gi stable   Health Maintenance  Topic Date Due  . HIV Screening  09/30/1984  . MAMMOGRAM  11/20/2017  . PAP SMEAR-Modifier  12/01/2020  . TETANUS/TDAP  02/26/2021  . INFLUENZA VACCINE  Completed   Health Maintenance Review LIFESTYLE:  Exercise:   Walking every day  Tobacco/ETS: no Alcohol:  no Sugar beverages:no Sleep: 6-8 hours every day   Walking  And  .   Drug use: no HH of 3  Son in school at [employer] Work:  Homemaker     ROS:  GEN/ HEENT: No fever, significant weight changes sweats headaches vision problems hearing changes, CV/ PULM; No chest pain shortness of breath cough, syncope,edema  change in exercise tolerance. GI /GU: No adominal pain, vomiting, change in bowel habits. No blood in the stool. No significant GU symptoms. SKIN/HEME: ,no acute skin rashes suspicious lesions or bleeding. No lymphadenopathy, nodules, masses.  See above  About acne NEURO/ PSYCH:  No neurologic signs such as weakness numbness. No depression anxiety. IMM/ Allergy: No unusual infections.  Allergy .   REST of 12 system review negative except as per HPI   Past Medical History:  Diagnosis Date  . H. pylori infection 2019   Per dr Loreta Ave  . HELLP  (hemolytic anemia/elev liver enzymes/low platelets in pregnancy)    c-section 2007  . Hyperlipidemia   . Hypothyroidism   . Pancreatitis    2000 and 2005  . Panic attacks    on meds pre pregnancy/hx    Past Surgical History:  Procedure Laterality Date  . APPENDECTOMY  2001  . CESAREAN SECTION  2007    Family History  Problem Relation Age of Onset  . Heart attack Father        in 61s   . Hyperlipidemia Father   . Hypertension Father        gm and gf  . CVA Father        arrynthmia ? af  age 43   . Throat cancer Father   . Arrhythmia Father   . Diabetes Unknown        gm and gf  . Arthritis Unknown        gp ? OA  . Hyperlipidemia Mother   . CVA Mother   . Colon cancer Neg Hx   . Stomach cancer Neg Hx     Social History   Socioeconomic History  . Marital status: Married    Spouse name: Not on file  . Number of children: Not on file  . Years of education: Not on file  . Highest education level: Not on file  Occupational History  . Not on file  Social Needs  .  Financial resource strain: Not on file  . Food insecurity    Worry: Not on file    Inability: Not on file  . Transportation needs    Medical: Not on file    Non-medical: Not on file  Tobacco Use  . Smoking status: Never Smoker  . Smokeless tobacco: Never Used  Substance and Sexual Activity  . Alcohol use: No  . Drug use: No  . Sexual activity: Not on file  Lifestyle  . Physical activity    Days per week: Not on file    Minutes per session: Not on file  . Stress: Not on file  Relationships  . Social Musician on phone: Not on file    Gets together: Not on file    Attends religious service: Not on file    Active member of club or organization: Not on file    Attends meetings of clubs or organizations: Not on file    Relationship status: Not on file  Other Topics Concern  . Not on file  Social History Narrative   HH of 3 pet  Dog     10 yo son child at home [place]   No ets    No caffeine   Drink milk 2-3 x per day    reg exercise    [place]          Outpatient Medications Prior to Visit  Medication Sig Dispense Refill  . atorvastatin (LIPITOR) 10 MG tablet TAKE 1 TABLET(10 MG) BY MOUTH DAILY 90 tablet 0  . FLUoxetine (PROZAC) 20 MG capsule Take 1 capsule (20 mg total) by mouth daily. 90 capsule 1  . levothyroxine (SYNTHROID) 50 MCG tablet TAKE 1 TABLET BY MOUTH  DAILY 90 tablet 0   No facility-administered medications prior to visit.      EXAM:  BP 116/64 (BP Location: Right Arm, Patient Position: Sitting, Cuff Size: Normal)   Pulse 64   Temp 97.8 F (36.6 C) (Temporal)   Ht 5\' 2"  (1.575 m)   Wt 155 lb (70.3 kg)   SpO2 98%   BMI 28.35 kg/m   Body mass index is 28.35 kg/m. Wt Readings from Last 3 Encounters:  03/02/19 155 lb (70.3 kg)  11/19/17 152 lb (68.9 kg)  03/24/17 164 lb 9.6 oz (74.7 kg)    Physical Exam: Vital signs reviewed 03/26/17 is a well-developed well-nourished alert cooperative    who appearsr stated age in no acute distress.  HEENT: normocephalic atraumatic , Eyes: PERRL EOM's full, conjunctiva clear,, Ears: no deformity EAC's clear TMs with normal landmarks. Mouth: masked NECK: supple without masses, thyromegaly or bruits. CHEST/PULM:  Clear to auscultation and percussion breath sounds equal no wheeze , rales or rhonchi. No chest wall deformities or tenderness. Breast: normal by inspection . No dimpling, discharge, masses, tenderness or discharge . CV: PMI is nondisplaced, S1 S2 no gallops, murmurs, rubs. Peripheral pulses are full without delay.No JVD .  ABDOMEN: Bowel sounds normal nontender  No guard or rebound, no hepato splenomegal no CVA tenderness.   Extremtities:  No clubbing cyanosis or edema, no acute joint swelling or redness no focal atrophy NEURO:  Oriented x3, cranial nerves 3-12 appear to be intact, no obvious focal weakness,gait within normal limits no abnormal reflexes or asymmetrical SKIN: No  acute rashes normal turgor, color, no bruising or petechiae.  On face scattered  Acne  Less than 12old inflammatory le lesions  around chin bilaterally  No pustules  No increase body hair  PSYCH: Oriented, good eye contact, no obvious depression anxiety, cognition and judgment appear normal. LN: no cervical axillary inguinal adenopathy  Lab Results  Component Value Date   WBC 8.6 11/19/2017   HGB 13.0 11/19/2017   HCT 38.5 11/19/2017   PLT 330.0 11/19/2017   GLUCOSE 91 11/19/2017   CHOL 176 06/13/2016   TRIG 113.0 06/13/2016   HDL 54.50 06/13/2016   LDLDIRECT 173.7 02/20/2011   LDLCALC 99 06/13/2016   ALT 13 11/19/2017   AST 20 11/19/2017   NA 138 11/19/2017   K 4.6 11/19/2017   CL 105 11/19/2017   CREATININE 0.71 11/19/2017   BUN 14 11/19/2017   CO2 23 11/19/2017   TSH 0.88 11/19/2017   HGBA1C 5.7 06/13/2016    BP Readings from Last 3 Encounters:  03/02/19 116/64  11/19/17 (!) 98/54  03/24/17 104/68    Lab results reviewed with patient   ASSESSMENT AND PLAN:  Discussed the following assessment and plan:    ICD-10-CM   1. Visit for preventive health examination  Z00.00 Basic metabolic panel    CBC with Differential    Hepatic function panel    Lipid panel    TSH    T4, Free (Thyrox)  2. Medication management  Z79.899 Basic metabolic panel    CBC with Differential    Hepatic function panel    Lipid panel    TSH    T4, Free (Thyrox)  3. Hypothyroidism, unspecified type  E03.9 Basic metabolic panel    CBC with Differential    Hepatic function panel    Lipid panel    TSH    T4, Free (Thyrox)  4. Hyperlipidemia, unspecified hyperlipidemia type  E78.5 Basic metabolic panel    CBC with Differential    Hepatic function panel    Lipid panel    TSH    T4, Free (Thyrox)  5. Adult acne  L70.9 Basic metabolic panel    CBC with Differential    Hepatic function panel    Lipid panel    TSH    T4, Free (Thyrox)   Lab monitoring  Can try otc Differin for now  And  see derm  dont see  hyperandrogenism  Findings but uncertain why now a problem   Says not wear masks that much but in that distribution.  Many rx options possible   Patient Care Team: Yoskar Murrillo, Neta Mends, MD as PCP - General Haygood, Maris Berger, MD (Inactive) (Obstetrics and Gynecology) Charna Elizabeth, MD as Consulting Physician (Gastroenterology) Patient Instructions  Glad you are doing well.   Adult acne  Has many   Treatment options   Sometimes hormonal treatment.   For now try OTC Differin    Every night or every other night on areas of  Break out. And get appt with dermatology  As possible.     Will notify you  of labs when available.     Health Maintenance, Female Adopting a healthy lifestyle and getting preventive care are important in promoting health and wellness. Ask your health care provider about:  The right schedule for you to have regular tests and exams.  Things you can do on your own to prevent diseases and keep yourself healthy. What should I know about diet, weight, and exercise? Eat a healthy diet   Eat a diet that includes plenty of vegetables, fruits, low-fat dairy products, and lean protein.  Do not eat a lot of foods that are high in solid  fats, added sugars, or sodium. Maintain a healthy weight Body mass index (BMI) is used to identify weight problems. It estimates body fat based on height and weight. Your health care provider can help determine your BMI and help you achieve or maintain a healthy weight. Get regular exercise Get regular exercise. This is one of the most important things you can do for your health. Most adults should:  Exercise for at least 150 minutes each week. The exercise should increase your heart rate and make you sweat (moderate-intensity exercise).  Do strengthening exercises at least twice a week. This is in addition to the moderate-intensity exercise.  Spend less time sitting. Even light physical activity can be beneficial. Watch  cholesterol and blood lipids Have your blood tested for lipids and cholesterol at 49 years of age, then have this test every 5 years. Have your cholesterol levels checked more often if:  Your lipid or cholesterol levels are high.  You are older than 49 years of age.  You are at high risk for heart disease. What should I know about cancer screening? Depending on your health history and family history, you may need to have cancer screening at various ages. This may include screening for:  Breast cancer.  Cervical cancer.  Colorectal cancer.  Skin cancer.  Lung cancer. What should I know about heart disease, diabetes, and high blood pressure? Blood pressure and heart disease  High blood pressure causes heart disease and increases the risk of stroke. This is more likely to develop in people who have high blood pressure readings, are of African descent, or are overweight.  Have your blood pressure checked: ? Every 3-5 years if you are 1-21 years of age. ? Every year if you are 91 years old or older. Diabetes Have regular diabetes screenings. This checks your fasting blood sugar level. Have the screening done:  Once every three years after age 46 if you are at a normal weight and have a low risk for diabetes.  More often and at a younger age if you are overweight or have a high risk for diabetes. What should I know about preventing infection? Hepatitis B If you have a higher risk for hepatitis B, you should be screened for this virus. Talk with your health care provider to find out if you are at risk for hepatitis B infection. Hepatitis C Testing is recommended for:  Everyone born from [birth year] through 1966.  Anyone with known risk factors for hepatitis C. Sexually transmitted infections (STIs)  Get screened for STIs, including gonorrhea and chlamydia, if: ? You are sexually active and are younger than 49 years of age. ? You are older than 49 years of age and your health care  provider tells you that you are at risk for this type of infection. ? Your sexual activity has changed since you were last screened, and you are at increased risk for chlamydia or gonorrhea. Ask your health care provider if you are at risk.  Ask your health care provider about whether you are at high risk for HIV. Your health care provider may recommend a prescription medicine to help prevent HIV infection. If you choose to take medicine to prevent HIV, you should first get tested for HIV. You should then be tested every 3 months for as long as you are taking the medicine. Pregnancy  If you are about to stop having your period (premenopausal) and you may become pregnant, seek counseling before you get pregnant.  Take 400 to  800 micrograms (mcg) of folic acid every day if you become pregnant.  Ask for birth control (contraception) if you want to prevent pregnancy. Osteoporosis and menopause Osteoporosis is a disease in which the bones lose minerals and strength with aging. This can result in bone fractures. If you are 49 years old or older, or if you are at risk for osteoporosis and fractures, ask your health care provider if you should:  Be screened for bone loss.  Take a calcium or vitamin D supplement to lower your risk of fractures.  Be given hormone replacement therapy (HRT) to treat symptoms of menopause. Follow these instructions at home: Lifestyle  Do not use any products that contain nicotine or tobacco, such as cigarettes, e-cigarettes, and chewing tobacco. If you need help quitting, ask your health care provider.  Do not use street drugs.  Do not share needles.  Ask your health care provider for help if you need support or information about quitting drugs. Alcohol use  Do not drink alcohol if: ? Your health care provider tells you not to drink. ? You are pregnant, may be pregnant, or are planning to become pregnant.  If you drink alcohol: ? Limit how much you use to 0-1  drink a day. ? Limit intake if you are breastfeeding.  Be aware of how much alcohol is in your drink. In the [place] one drink equals one 12 oz bottle of beer (355 mL), one 5 oz glass of wine (148 mL), or one 1 oz glass of hard liquor (44 mL). General instructions  Schedule regular health, dental, and eye exams.  Stay current with your vaccines.  Tell your health care provider if: ? You often feel depressed. ? You have ever been abused or do not feel safe at home. Summary  Adopting a healthy lifestyle and getting preventive care are important in promoting health and wellness.  Follow your health care provider's instructions about healthy diet, exercising, and getting tested or screened for diseases.  Follow your health care provider's instructions on monitoring your cholesterol and blood pressure. This information is not intended to replace advice given to you by your health care provider. Make sure you discuss any questions you have with your health care provider. Document Released: 09/24/2010 Document Revised: 03/04/2018 Document Reviewed: 03/04/2018 Elsevier Patient Education  2020 [employer]  Acne  Acne is a skin problem that causes pimples and other skin changes. The skin has many tiny openings called pores. Each pore contains an oil gland. Oil glands make an oily substance that is called sebum. Acne occurs when the pores in the skin get blocked. The pores may become infected with bacteria, or they may become red, sore, and swollen. Acne is a common skin problem, especially for teenagers. It often occurs on the face, neck, chest, upper arms, and back. Acne usually goes away over time. What are the causes? Acne is caused when oil glands get blocked with sebum, dead skin cells, and dirt. The bacteria that are normally found in the oil glands then multiply and cause inflammation. Acne is commonly triggered by changes in your hormones. These hormonal changes can cause the oil glands  to get bigger and to make more sebum. Factors that can make acne worse include:  Hormone changes during: ? Adolescence. ? Women's menstrual cycles. ? Pregnancy.  Oil-based cosmetics and hair products.  Stress.  Hormone problems that are caused by certain diseases.  Certain medicines.  Pressure from headbands, backpacks, or shoulder pads.  Exposure  to certain oils and chemicals.  Eating a diet high in carbohydrates that quickly turn to sugar. These include dairy products, desserts, and chocolates. What increases the risk? This condition is more likely to develop in:  Teenagers.  People who have a family history of acne. What are the signs or symptoms? Symptoms include:  Small, red bumps (pimples or papules).  Whiteheads.  Blackheads.  Small, pus-filled pimples (pustules).  Big, red pimples or pustules that feel tender. More severe acne can cause:  An abscess. This is an infected area that contains a collection of pus.  Cysts. These are hard, painful, fluid-filled sacs.  Scars. These can happen after large pimples heal. How is this diagnosed? This condition is diagnosed with a medical history and physical exam. Blood tests may also be done. How is this treated? Treatment for this condition can vary depending on the severity of your acne. Treatment may include:  Creams and lotions that prevent oil glands from clogging.  Creams and lotions that treat or prevent infections and inflammation.  Antibiotic medicines that are applied to the skin or taken as a pill.  Pills that decrease sebum production.  Birth control pills.  Light or laser treatments.  Injections of medicine into the affected areas.  Chemicals that cause peeling of the skin.  Surgery. Your health care provider will also recommend the best way to take care of your skin. Good skin care is the most important part of treatment. Follow these instructions at home: Skin care Take care of your  skin as told by your health care provider. You may be told to do these things:  Wash your skin gently at least two times each day, as well as: ? After you exercise. ? Before you go to bed.  Use mild soap.  Apply a water-based skin moisturizer after you wash your skin.  Use a sunscreen or sunblock with SPF 30 or greater. This is especially important if you are using acne medicines.  Choose cosmetics that will not block your oil glands (are noncomedogenic). Medicines  Take over-the-counter and prescription medicines only as told by your health care provider.  If you were prescribed an antibiotic medicine, apply it or take it as told by your health care provider. Do not stop using the antibiotic even if your condition improves. General instructions  Keep your hair clean and off your face. If you have oily hair, shampoo your hair regularly or daily.  Avoid wearing tight headbands or hats.  Avoid picking or squeezing your pimples. That can make your acne worse and cause scarring.  Shave gently and only when necessary.  Keep a food journal to figure out if any foods are linked to your acne. Avoid dairy products, desserts, and chocolates.  Take steps to manage and reduce stress.  Keep all follow-up visits as told by your health care provider. This is important. Contact a health care provider if:  Your acne is not better after eight weeks.  Your acne gets worse.  You have a large area of skin that is red or tender.  You think that you are having side effects from any acne medicine. Summary  Acne is a skin problem that causes pimples and other skin changes. Acne is a common skin problem, especially for teenagers. Acne usually goes away over time.  Acne is commonly triggered by changes in your hormones. There are many other causes, such as stress, diet, and certain medicines.  Follow your health care provider's instructions for how  to take care of your skin. Good skin care is  the most important part of treatment.  Take over-the-counter and prescription medicines only as told by your health care provider.  Contact your health care provider if you think that you are having side effects from any acne medicine. This information is not intended to replace advice given to you by your health care provider. Make sure you discuss any questions you have with your health care provider. Document Released: 03/08/2000 Document Revised: 07/22/2017 Document Reviewed: 07/22/2017 Elsevier Patient Education  2020 [employer].       [place] K. Zuriyah Shatz M.D.

## 2019-03-02 ENCOUNTER — Encounter: Payer: Self-pay | Admitting: Internal Medicine

## 2019-03-02 ENCOUNTER — Ambulatory Visit (INDEPENDENT_AMBULATORY_CARE_PROVIDER_SITE_OTHER): Payer: 59 | Admitting: Internal Medicine

## 2019-03-02 VITALS — BP 116/64 | HR 64 | Temp 97.8°F | Ht 62.0 in | Wt 155.0 lb

## 2019-03-02 DIAGNOSIS — L709 Acne, unspecified: Secondary | ICD-10-CM

## 2019-03-02 DIAGNOSIS — F410 Panic disorder [episodic paroxysmal anxiety] without agoraphobia: Secondary | ICD-10-CM

## 2019-03-02 DIAGNOSIS — Z0000 Encounter for general adult medical examination without abnormal findings: Secondary | ICD-10-CM

## 2019-03-02 DIAGNOSIS — Z79899 Other long term (current) drug therapy: Secondary | ICD-10-CM | POA: Diagnosis not present

## 2019-03-02 DIAGNOSIS — E785 Hyperlipidemia, unspecified: Secondary | ICD-10-CM

## 2019-03-02 DIAGNOSIS — E039 Hypothyroidism, unspecified: Secondary | ICD-10-CM

## 2019-03-02 LAB — HEPATIC FUNCTION PANEL
ALT: 18 U/L (ref 0–35)
AST: 20 U/L (ref 0–37)
Albumin: 4.5 g/dL (ref 3.5–5.2)
Alkaline Phosphatase: 58 U/L (ref 39–117)
Bilirubin, Direct: 0.2 mg/dL (ref 0.0–0.3)
Total Bilirubin: 1.1 mg/dL (ref 0.2–1.2)
Total Protein: 7 g/dL (ref 6.0–8.3)

## 2019-03-02 LAB — CBC WITH DIFFERENTIAL/PLATELET
Basophils Absolute: 0.1 10*3/uL (ref 0.0–0.1)
Basophils Relative: 0.7 % (ref 0.0–3.0)
Eosinophils Absolute: 0.2 10*3/uL (ref 0.0–0.7)
Eosinophils Relative: 3.1 % (ref 0.0–5.0)
HCT: 39.2 % (ref 36.0–46.0)
Hemoglobin: 13 g/dL (ref 12.0–15.0)
Lymphocytes Relative: 31.9 % (ref 12.0–46.0)
Lymphs Abs: 2.3 10*3/uL (ref 0.7–4.0)
MCHC: 33.2 g/dL (ref 30.0–36.0)
MCV: 95 fl (ref 78.0–100.0)
Monocytes Absolute: 0.7 10*3/uL (ref 0.1–1.0)
Monocytes Relative: 9.7 % (ref 3.0–12.0)
Neutro Abs: 3.9 10*3/uL (ref 1.4–7.7)
Neutrophils Relative %: 54.6 % (ref 43.0–77.0)
Platelets: 341 10*3/uL (ref 150.0–400.0)
RBC: 4.13 Mil/uL (ref 3.87–5.11)
RDW: 12.7 % (ref 11.5–15.5)
WBC: 7.1 10*3/uL (ref 4.0–10.5)

## 2019-03-02 LAB — LIPID PANEL
Cholesterol: 179 mg/dL (ref 0–200)
HDL: 54.5 mg/dL (ref >39.00)
LDL Cholesterol: 107 mg/dL — ABNORMAL HIGH (ref 0–99)
NonHDL: 124.7
Total CHOL/HDL Ratio: 3
Triglycerides: 88 mg/dL (ref 0.0–149.0)
VLDL: 17.6 mg/dL (ref 0.0–40.0)

## 2019-03-02 LAB — BASIC METABOLIC PANEL
BUN: 19 mg/dL (ref 6–23)
CO2: 28 mEq/L (ref 19–32)
Calcium: 9.2 mg/dL (ref 8.4–10.5)
Chloride: 104 mEq/L (ref 96–112)
Creatinine, Ser: 0.78 mg/dL (ref 0.40–1.20)
GFR: 78.36 mL/min (ref >60.00)
Glucose, Bld: 96 mg/dL (ref 70–99)
Potassium: 4.1 mEq/L (ref 3.5–5.1)
Sodium: 138 mEq/L (ref 135–145)

## 2019-03-02 LAB — TSH: TSH: 0.84 u[IU]/mL (ref 0.35–4.50)

## 2019-03-02 LAB — T4, FREE: Free T4: 0.79 ng/dL (ref 0.60–1.60)

## 2019-03-02 NOTE — Patient Instructions (Addendum)
Glad you are doing well.   Adult acne  Has many   Treatment options   Sometimes hormonal treatment.   For now try OTC Differin    Every night or every other night on areas of  Break out. And get appt with dermatology  As possible.     Will notify you  of labs when available.     Health Maintenance, Female Adopting a healthy lifestyle and getting preventive care are important in promoting health and wellness. Ask your health care provider about:  The right schedule for you to have regular tests and exams.  Things you can do on your own to prevent diseases and keep yourself healthy. What should I know about diet, weight, and exercise? Eat a healthy diet   Eat a diet that includes plenty of vegetables, fruits, low-fat dairy products, and lean protein.  Do not eat a lot of foods that are high in solid fats, added sugars, or sodium. Maintain a healthy weight Body mass index (BMI) is used to identify weight problems. It estimates body fat based on height and weight. Your health care provider can help determine your BMI and help you achieve or maintain a healthy weight. Get regular exercise Get regular exercise. This is one of the most important things you can do for your health. Most adults should:  Exercise for at least 150 minutes each week. The exercise should increase your heart rate and make you sweat (moderate-intensity exercise).  Do strengthening exercises at least twice a week. This is in addition to the moderate-intensity exercise.  Spend less time sitting. Even light physical activity can be beneficial. Watch cholesterol and blood lipids Have your blood tested for lipids and cholesterol at 49 years of age, then have this test every 5 years. Have your cholesterol levels checked more often if:  Your lipid or cholesterol levels are high.  You are older than 49 years of age.  You are at high risk for heart disease. What should I know about cancer screening? Depending on  your health history and family history, you may need to have cancer screening at various ages. This may include screening for:  Breast cancer.  Cervical cancer.  Colorectal cancer.  Skin cancer.  Lung cancer. What should I know about heart disease, diabetes, and high blood pressure? Blood pressure and heart disease  High blood pressure causes heart disease and increases the risk of stroke. This is more likely to develop in people who have high blood pressure readings, are of African descent, or are overweight.  Have your blood pressure checked: ? Every 3-5 years if you are 53-86 years of age. ? Every year if you are 1 years old or older. Diabetes Have regular diabetes screenings. This checks your fasting blood sugar level. Have the screening done:  Once every three years after age 36 if you are at a normal weight and have a low risk for diabetes.  More often and at a younger age if you are overweight or have a high risk for diabetes. What should I know about preventing infection? Hepatitis B If you have a higher risk for hepatitis B, you should be screened for this virus. Talk with your health care provider to find out if you are at risk for hepatitis B infection. Hepatitis C Testing is recommended for:  Everyone born from [birth year] through 1966.  Anyone with known risk factors for hepatitis C. Sexually transmitted infections (STIs)  Get screened for STIs, including gonorrhea and chlamydia,  if: ? You are sexually active and are younger than 49 years of age. ? You are older than 49 years of age and your health care provider tells you that you are at risk for this type of infection. ? Your sexual activity has changed since you were last screened, and you are at increased risk for chlamydia or gonorrhea. Ask your health care provider if you are at risk.  Ask your health care provider about whether you are at high risk for HIV. Your health care provider may recommend a prescription  medicine to help prevent HIV infection. If you choose to take medicine to prevent HIV, you should first get tested for HIV. You should then be tested every 3 months for as long as you are taking the medicine. Pregnancy  If you are about to stop having your period (premenopausal) and you may become pregnant, seek counseling before you get pregnant.  Take 400 to 800 micrograms (mcg) of folic acid every day if you become pregnant.  Ask for birth control (contraception) if you want to prevent pregnancy. Osteoporosis and menopause Osteoporosis is a disease in which the bones lose minerals and strength with aging. This can result in bone fractures. If you are 79 years old or older, or if you are at risk for osteoporosis and fractures, ask your health care provider if you should:  Be screened for bone loss.  Take a calcium or vitamin D supplement to lower your risk of fractures.  Be given hormone replacement therapy (HRT) to treat symptoms of menopause. Follow these instructions at home: Lifestyle  Do not use any products that contain nicotine or tobacco, such as cigarettes, e-cigarettes, and chewing tobacco. If you need help quitting, ask your health care provider.  Do not use street drugs.  Do not share needles.  Ask your health care provider for help if you need support or information about quitting drugs. Alcohol use  Do not drink alcohol if: ? Your health care provider tells you not to drink. ? You are pregnant, may be pregnant, or are planning to become pregnant.  If you drink alcohol: ? Limit how much you use to 0-1 drink a day. ? Limit intake if you are breastfeeding.  Be aware of how much alcohol is in your drink. In the [place] one drink equals one 12 oz bottle of beer (355 mL), one 5 oz glass of wine (148 mL), or one 1 oz glass of hard liquor (44 mL). General instructions  Schedule regular health, dental, and eye exams.  Stay current with your vaccines.  Tell your health  care provider if: ? You often feel depressed. ? You have ever been abused or do not feel safe at home. Summary  Adopting a healthy lifestyle and getting preventive care are important in promoting health and wellness.  Follow your health care provider's instructions about healthy diet, exercising, and getting tested or screened for diseases.  Follow your health care provider's instructions on monitoring your cholesterol and blood pressure. This information is not intended to replace advice given to you by your health care provider. Make sure you discuss any questions you have with your health care provider. Document Released: 09/24/2010 Document Revised: 03/04/2018 Document Reviewed: 03/04/2018 Elsevier Patient Education  2020 Elsevier Inc.  Acne  Acne is a skin problem that causes pimples and other skin changes. The skin has many tiny openings called pores. Each pore contains an oil gland. Oil glands make an oily substance that is called sebum.  Acne occurs when the pores in the skin get blocked. The pores may become infected with bacteria, or they may become red, sore, and swollen. Acne is a common skin problem, especially for teenagers. It often occurs on the face, neck, chest, upper arms, and back. Acne usually goes away over time. What are the causes? Acne is caused when oil glands get blocked with sebum, dead skin cells, and dirt. The bacteria that are normally found in the oil glands then multiply and cause inflammation. Acne is commonly triggered by changes in your hormones. These hormonal changes can cause the oil glands to get bigger and to make more sebum. Factors that can make acne worse include:  Hormone changes during: ? Adolescence. ? Women's menstrual cycles. ? Pregnancy.  Oil-based cosmetics and hair products.  Stress.  Hormone problems that are caused by certain diseases.  Certain medicines.  Pressure from headbands, backpacks, or shoulder pads.  Exposure to  certain oils and chemicals.  Eating a diet high in carbohydrates that quickly turn to sugar. These include dairy products, desserts, and chocolates. What increases the risk? This condition is more likely to develop in:  Teenagers.  People who have a family history of acne. What are the signs or symptoms? Symptoms include:  Small, red bumps (pimples or papules).  Whiteheads.  Blackheads.  Small, pus-filled pimples (pustules).  Big, red pimples or pustules that feel tender. More severe acne can cause:  An abscess. This is an infected area that contains a collection of pus.  Cysts. These are hard, painful, fluid-filled sacs.  Scars. These can happen after large pimples heal. How is this diagnosed? This condition is diagnosed with a medical history and physical exam. Blood tests may also be done. How is this treated? Treatment for this condition can vary depending on the severity of your acne. Treatment may include:  Creams and lotions that prevent oil glands from clogging.  Creams and lotions that treat or prevent infections and inflammation.  Antibiotic medicines that are applied to the skin or taken as a pill.  Pills that decrease sebum production.  Birth control pills.  Light or laser treatments.  Injections of medicine into the affected areas.  Chemicals that cause peeling of the skin.  Surgery. Your health care provider will also recommend the best way to take care of your skin. Good skin care is the most important part of treatment. Follow these instructions at home: Skin care Take care of your skin as told by your health care provider. You may be told to do these things:  Wash your skin gently at least two times each day, as well as: ? After you exercise. ? Before you go to bed.  Use mild soap.  Apply a water-based skin moisturizer after you wash your skin.  Use a sunscreen or sunblock with SPF 30 or greater. This is especially important if you are  using acne medicines.  Choose cosmetics that will not block your oil glands (are noncomedogenic). Medicines  Take over-the-counter and prescription medicines only as told by your health care provider.  If you were prescribed an antibiotic medicine, apply it or take it as told by your health care provider. Do not stop using the antibiotic even if your condition improves. General instructions  Keep your hair clean and off your face. If you have oily hair, shampoo your hair regularly or daily.  Avoid wearing tight headbands or hats.  Avoid picking or squeezing your pimples. That can make your acne worse  and cause scarring.  Shave gently and only when necessary.  Keep a food journal to figure out if any foods are linked to your acne. Avoid dairy products, desserts, and chocolates.  Take steps to manage and reduce stress.  Keep all follow-up visits as told by your health care provider. This is important. Contact a health care provider if:  Your acne is not better after eight weeks.  Your acne gets worse.  You have a large area of skin that is red or tender.  You think that you are having side effects from any acne medicine. Summary  Acne is a skin problem that causes pimples and other skin changes. Acne is a common skin problem, especially for teenagers. Acne usually goes away over time.  Acne is commonly triggered by changes in your hormones. There are many other causes, such as stress, diet, and certain medicines.  Follow your health care provider's instructions for how to take care of your skin. Good skin care is the most important part of treatment.  Take over-the-counter and prescription medicines only as told by your health care provider.  Contact your health care provider if you think that you are having side effects from any acne medicine. This information is not intended to replace advice given to you by your health care provider. Make sure you discuss any questions you  have with your health care provider. Document Released: 03/08/2000 Document Revised: 07/22/2017 Document Reviewed: 07/22/2017 Elsevier Patient Education  2020 [employer]

## 2019-03-07 ENCOUNTER — Other Ambulatory Visit: Payer: Self-pay | Admitting: Internal Medicine

## 2019-05-18 ENCOUNTER — Other Ambulatory Visit: Payer: Self-pay | Admitting: Internal Medicine

## 2019-06-20 ENCOUNTER — Other Ambulatory Visit: Payer: Self-pay | Admitting: Internal Medicine

## 2019-06-24 ENCOUNTER — Ambulatory Visit: Payer: 59 | Attending: Internal Medicine

## 2019-06-24 DIAGNOSIS — Z23 Encounter for immunization: Secondary | ICD-10-CM

## 2019-06-24 NOTE — Progress Notes (Signed)
   Covid-19 Vaccination Clinic  Name:  Karen Fleming    MRN: [id number] DOB: Apr 09, 1970  06/24/2019  [name] was observed post Covid-19 immunization for 15 minutes without incident. She was provided with Vaccine Information Sheet and instruction to access the V-Safe system.   [name] was instructed to call 911 with any severe reactions post vaccine: Marland Kitchen Difficulty breathing  . Swelling of face and throat  . A fast heartbeat  . A bad rash all over body  . Dizziness and weakness   Immunizations Administered    Name Date Dose VIS Date Route   Pfizer COVID-19 Vaccine 06/24/2019  9:37 AM 0.3 mL 03/05/2019 Intramuscular   Manufacturer: [employer], [employer]   Lot: [id number]   NDC: [address]-2

## 2019-07-14 ENCOUNTER — Ambulatory Visit: Payer: 59 | Attending: Internal Medicine

## 2019-07-14 DIAGNOSIS — Z23 Encounter for immunization: Secondary | ICD-10-CM

## 2019-07-14 NOTE — Progress Notes (Signed)
   Covid-19 Vaccination Clinic  Name:  Karen Fleming    MRN: [id number] DOB: 06/09/1970  07/14/2019  [name] was observed post Covid-19 immunization for 15 minutes without incident. She was provided with Vaccine Information Sheet and instruction to access the V-Safe system.   [name] was instructed to call 911 with any severe reactions post vaccine: Marland Kitchen Difficulty breathing  . Swelling of face and throat  . A fast heartbeat  . A bad rash all over body  . Dizziness and weakness   Immunizations Administered    Name Date Dose VIS Date Route   Pfizer COVID-19 Vaccine 07/14/2019  8:31 AM 0.3 mL 05/19/2018 Intramuscular   Manufacturer: [employer], [employer]   Lot: [id number]   NDC: [address]-1

## 2019-07-19 ENCOUNTER — Ambulatory Visit: Payer: 59

## 2019-08-12 ENCOUNTER — Ambulatory Visit: Payer: 59

## 2019-08-12 ENCOUNTER — Other Ambulatory Visit: Payer: Self-pay | Admitting: Internal Medicine

## 2019-08-12 NOTE — Telephone Encounter (Signed)
Looking back at OV from 03/02/2019 and lab result notes, I do not see that this was addressed:  All normal Except ldl cholesterol slightly up from last time   Can try increasing the atorvastatin to 20 mg per day  AND/ OR more attention to lifestyle intervention healthy eating and exercise .  repeat lipid panel next Next year .  Do you want me to send as is or call patient to increase to 20mg ?   Please advise.

## 2019-08-13 NOTE — Telephone Encounter (Signed)
I would ask patient  Preference      Saying the levels are pretty good   But just not perfect     Prefer ldl below  100    Either way is fine and can discuss at next check

## 2019-08-13 NOTE — Telephone Encounter (Signed)
Called patient and she would like to work on healthy diet and exercise and stay on 10 mg for now and recheck in December. Patient verbalized an understanding.

## 2019-11-08 ENCOUNTER — Other Ambulatory Visit: Payer: Self-pay | Admitting: Internal Medicine

## 2019-11-22 ENCOUNTER — Other Ambulatory Visit: Payer: Self-pay

## 2019-11-22 NOTE — Progress Notes (Signed)
Chief Complaint  Patient presents with  . Abdominal Pain    right side pain in back, abdomen, and right groin pain, hurts to urinate, started 4 days ago  . Leg Pain    inside of right thigh sharp pain at times    HPI: Karen Fleming 49 y.o. come in for  New problem onset evening august 25 after eating ruq pain and radiation to back and then lown ti right groina rea   Used heating pad to help   had burning urination without hematuria    Better now but still some sx Right med thigh shooting pain  Asks if could be a vein problem  No falling or hip issues  No fever chills  Periods  Skipped on last one July  Nl  Has some hot flushes  No sa in 2 years  So denies risk for sti or preg    ROS: See pertinent positives and negatives per HPI. No vomiting diarrhea  Change meds  Hx h pylori and rx    2 years ago   Mom had hx of kideny stones  No personal hx  Past Medical History:  Diagnosis Date  . H. pylori infection 2019   Per dr Loreta Ave  . HELLP (hemolytic anemia/elev liver enzymes/low platelets in pregnancy)    c-section 2007  . Hyperlipidemia   . Hypothyroidism   . Pancreatitis    2000 and 2005  . Panic attacks    on meds pre pregnancy/hx    Family History  Problem Relation Age of Onset  . Heart attack Father        in 57s   . Hyperlipidemia Father   . Hypertension Father        gm and gf  . CVA Father        arrynthmia ? af  age 54   . Throat cancer Father   . Arrhythmia Father   . Diabetes Other        gm and gf  . Arthritis Other        gp ? OA  . Hyperlipidemia Mother   . CVA Mother   . Colon cancer Neg Hx   . Stomach cancer Neg Hx     Social History   Socioeconomic History  . Marital status: Married    Spouse name: Not on file  . Number of children: Not on file  . Years of education: Not on file  . Highest education level: Not on file  Occupational History  . Not on file  Tobacco Use  . Smoking status: Never Smoker  . Smokeless tobacco: Never Used    Vaping Use  . Vaping Use: Never used  Substance and Sexual Activity  . Alcohol use: No  . Drug use: No  . Sexual activity: Not on file  Other Topics Concern  . Not on file  Social History Narrative   HH of 3 pet  Dog     87 yo son child at home [place]   No ets   No caffeine   Drink milk 2-3 x per day    reg exercise    [place]         Social Determinants of Health   Financial Resource Strain:   . Difficulty of Paying Living Expenses: Not on file  Food Insecurity:   . Worried About Programme researcher, broadcasting/film/video in the Last Year: Not on file  . Ran Out of Food in the Last Year:  Not on file  Transportation Needs:   . Lack of Transportation (Medical): Not on file  . Lack of Transportation (Non-Medical): Not on file  Physical Activity:   . Days of Exercise per Week: Not on file  . Minutes of Exercise per Session: Not on file  Stress:   . Feeling of Stress : Not on file  Social Connections:   . Frequency of Communication with Friends and Family: Not on file  . Frequency of Social Gatherings with Friends and Family: Not on file  . Attends Religious Services: Not on file  . Active Member of Clubs or Organizations: Not on file  . Attends Banker Meetings: Not on file  . Marital Status: Not on file    Outpatient Medications Prior to Visit  Medication Sig Dispense Refill  . atorvastatin (LIPITOR) 10 MG tablet TAKE 1 TABLET(10 MG) BY MOUTH DAILY 90 tablet 0  . bifidobacterium infantis (ALIGN) capsule Align 4 mg capsule  Take 1 capsule every day by oral route in the morning.    Marland Kitchen FLUoxetine (PROZAC) 20 MG capsule TAKE 1 CAPSULE(20 MG) BY MOUTH DAILY 90 capsule 1  . levothyroxine (SYNTHROID) 50 MCG tablet TAKE 1 TABLET BY MOUTH  DAILY 90 tablet 3  . MULTIPLE VITAMIN PO Take by mouth daily.     No facility-administered medications prior to visit.     EXAM:  BP 116/70   Pulse 68   Temp 98.3 F (36.8 C) (Oral)   Ht 5\' 2"  (1.575 m)   Wt 161 lb 9.6 oz  (73.3 kg)   SpO2 98%   BMI 29.56 kg/m   Body mass index is 29.56 kg/m.  GENERAL: vitals reviewed and listed above, alert, oriented, appears well hydrated and in no acute distress HEENT: atraumatic, conjunctiva  clear, no obvious abnormalities on inspection of external nose and ears OP :masked NECK: no obvious masses on inspection palpation  LUNGS: clear to auscultation bilaterally, no wheezes, rales or rhonchi, good air movement CV: HRRR, no clubbing cyanosis or  peripheral edema nl cap refill  Abdomen:  Sof,t normal bowel sounds without hepatosplenomegaly, no guarding rebound or masses no CVA tenderness but points to right flank nd radiation to lower ri pelvis as area of radiation  MS: moves all extremities without noticeable focal  Abnormality hip rom active passive nl and no inguinal adenopathy  PSYCH: pleasant and cooperative, no obvious depression or anxiety Lab Results  Component Value Date   WBC 7.1 03/02/2019   HGB 13.0 03/02/2019   HCT 39.2 03/02/2019   PLT 341.0 03/02/2019   GLUCOSE 96 03/02/2019   CHOL 179 03/02/2019   TRIG 88.0 03/02/2019   HDL 54.50 03/02/2019   LDLDIRECT 173.7 02/20/2011   LDLCALC 107 (H) 03/02/2019   ALT 18 03/02/2019   AST 20 03/02/2019   NA 138 03/02/2019   K 4.1 03/02/2019   CL 104 03/02/2019   CREATININE 0.78 03/02/2019   BUN 19 03/02/2019   CO2 28 03/02/2019   TSH 0.84 03/02/2019   HGBA1C 5.7 06/13/2016   BP Readings from Last 3 Encounters:  11/23/19 116/70  03/02/19 116/64  11/19/17 (!) 98/54   Urinalysis    Component Value Date/Time   COLORURINE yellow 09/18/2009 0919   APPEARANCEUR Clear 09/18/2009 0919   LABSPEC 1.025 09/18/2009 0919   PHURINE 6.5 09/18/2009 0919   HGBUR negative 09/18/2009 0919   BILIRUBINUR Negative 11/23/2019 1011   PROTEINUR Positive (A) 11/23/2019 1011   UROBILINOGEN 0.2 11/23/2019 1011  UROBILINOGEN 0.2 09/18/2009 0919   NITRITE Negative 11/23/2019 1011   NITRITE negative 09/18/2009 0919    LEUKOCYTESUR Large (3+) (A) 11/23/2019 1011     ASSESSMENT AND PLAN:  Discussed the following assessment and plan:  Urinary urgency - Plan: POCT urinalysis dipstick, Culture, Urine, CBC with Differential/Platelet, C-reactive protein, Comprehensive metabolic panel, TSH, Culture, Urine, TSH, Comprehensive metabolic panel, C-reactive protein, CBC with Differential/Platelet  Acute right-sided low back pain, unspecified whether sciatica present - radiating to right abdomen  - Plan: POCT urinalysis dipstick, Culture, Urine, CBC with Differential/Platelet, C-reactive protein, Comprehensive metabolic panel, TSH, Culture, Urine, TSH, Comprehensive metabolic panel, C-reactive protein, CBC with Differential/Platelet  Pain of right thigh - Plan: CBC with Differential/Platelet, C-reactive protein, Comprehensive metabolic panel, TSH, TSH, Comprehensive metabolic panel, C-reactive protein, CBC with Differential/Platelet  Suspected UTI - Plan: CBC with Differential/Platelet, C-reactive protein, Comprehensive metabolic panel, TSH, TSH, Comprehensive metabolic panel, C-reactive protein, CBC with Differential/Platelet  Hypothyroidism, unspecified type - Plan: CBC with Differential/Platelet, C-reactive protein, Comprehensive metabolic panel, TSH, TSH, Comprehensive metabolic panel, C-reactive protein, CBC with Differential/Platelet Hard to  Make one dx for all sx  consdier renal stone  And uti but better and no fever current systemic sx  Is peri menopausal and no risk of sti Has gyne appt soon Last abd [place] 2019 neg for biliary problemss Plan antibiotic close follo wup  Consider  Imaging for  stones  No evidence of cvt as diff dx  At this time   Sounds ms or referred    -Patient advised to return or notify health care team  if  new concerns arise.  Patient Instructions  This could be from a UTI or kidney infection    If not getting better we may do an imaging study of  Your kidney to check for stones .  Lab  and antibiotic today  Plan  Fu in 2 weeks or so depending      Burna Mortimer K. Onelia Cadmus M.D.

## 2019-11-23 ENCOUNTER — Ambulatory Visit (INDEPENDENT_AMBULATORY_CARE_PROVIDER_SITE_OTHER): Payer: 59 | Admitting: Internal Medicine

## 2019-11-23 ENCOUNTER — Encounter: Payer: Self-pay | Admitting: Internal Medicine

## 2019-11-23 VITALS — BP 116/70 | HR 68 | Temp 98.3°F | Ht 62.0 in | Wt 161.6 lb

## 2019-11-23 DIAGNOSIS — M79651 Pain in right thigh: Secondary | ICD-10-CM | POA: Diagnosis not present

## 2019-11-23 DIAGNOSIS — M545 Low back pain, unspecified: Secondary | ICD-10-CM

## 2019-11-23 DIAGNOSIS — R3989 Other symptoms and signs involving the genitourinary system: Secondary | ICD-10-CM | POA: Diagnosis not present

## 2019-11-23 DIAGNOSIS — R3915 Urgency of urination: Secondary | ICD-10-CM | POA: Diagnosis not present

## 2019-11-23 DIAGNOSIS — E039 Hypothyroidism, unspecified: Secondary | ICD-10-CM

## 2019-11-23 LAB — POCT URINALYSIS DIPSTICK
Bilirubin, UA: NEGATIVE
Blood, UA: POSITIVE
Glucose, UA: NEGATIVE
Ketones, UA: NEGATIVE
Nitrite, UA: NEGATIVE
Protein, UA: POSITIVE — AB
Spec Grav, UA: 1.015 (ref 1.010–1.025)
Urobilinogen, UA: 0.2 E.U./dL
pH, UA: 6 (ref 5.0–8.0)

## 2019-11-23 MED ORDER — SULFAMETHOXAZOLE-TRIMETHOPRIM 800-160 MG PO TABS: 1.0000 | ORAL_TABLET | Freq: Two times a day (BID) | ORAL | 0 refills | Status: DC

## 2019-11-23 NOTE — Patient Instructions (Signed)
This could be from a UTI or kidney infection    If not getting better we may do an imaging study of  Your kidney to check for stones .  Lab and antibiotic today  Plan  Fu in 2 weeks or so depending

## 2019-11-24 LAB — COMPREHENSIVE METABOLIC PANEL
AG Ratio: 1.6 (calc) (ref 1.0–2.5)
ALT: 13 U/L (ref 6–29)
AST: 16 U/L (ref 10–35)
Albumin: 4.1 g/dL (ref 3.6–5.1)
Alkaline phosphatase (APISO): 68 U/L (ref 37–153)
BUN: 25 mg/dL (ref 7–25)
CO2: 28 mmol/L (ref 20–32)
Calcium: 9.4 mg/dL (ref 8.6–10.4)
Chloride: 104 mmol/L (ref 98–110)
Creat: 0.81 mg/dL (ref 0.50–1.05)
Globulin: 2.6 g/dL (calc) (ref 1.9–3.7)
Glucose, Bld: 90 mg/dL (ref 65–99)
Potassium: 4.5 mmol/L (ref 3.5–5.3)
Sodium: 140 mmol/L (ref 135–146)
Total Bilirubin: 0.3 mg/dL (ref 0.2–1.2)
Total Protein: 6.7 g/dL (ref 6.1–8.1)

## 2019-11-24 LAB — CBC WITH DIFFERENTIAL/PLATELET
Absolute Monocytes: 855 cells/uL (ref 200–950)
Basophils Absolute: 64 cells/uL (ref 0–200)
Basophils Relative: 0.7 %
Eosinophils Absolute: 246 cells/uL (ref 15–500)
Eosinophils Relative: 2.7 %
HCT: 37 % (ref 35.0–45.0)
Hemoglobin: 12.3 g/dL (ref 11.7–15.5)
Lymphs Abs: 2648 cells/uL (ref 850–3900)
MCH: 31.2 pg (ref 27.0–33.0)
MCHC: 33.2 g/dL (ref 32.0–36.0)
MCV: 93.9 fL (ref 80.0–100.0)
MPV: 10 fL (ref 7.5–12.5)
Monocytes Relative: 9.4 %
Neutro Abs: 5287 cells/uL (ref 1500–7800)
Neutrophils Relative %: 58.1 %
Platelets: 346 10*3/uL (ref 140–400)
RBC: 3.94 10*6/uL (ref 3.80–5.10)
RDW: 11.1 % (ref 11.0–15.0)
Total Lymphocyte: 29.1 %
WBC: 9.1 10*3/uL (ref 3.8–10.8)

## 2019-11-24 LAB — C-REACTIVE PROTEIN: CRP: 12.6 mg/L — ABNORMAL HIGH (ref <8.0)

## 2019-11-24 LAB — TSH: TSH: 3.76 mIU/L

## 2019-11-25 LAB — URINE CULTURE
MICRO NUMBER:: 10893682
SPECIMEN QUALITY:: ADEQUATE

## 2019-11-27 NOTE — Progress Notes (Signed)
Uti confirmed and antibiotic should help resolve this Keep fu  plan   but let us know if not getting better.  Inflammation markers  elevated could be from infection.

## 2019-12-06 LAB — HM PAP SMEAR

## 2019-12-06 NOTE — Progress Notes (Signed)
Chief Complaint  Patient presents with  . Follow-up    still having side pain, acid reflux is worse and feels like food gets stuck  . Abdominal Pain    pain under right rib and getting worse    HPI: Karen Fleming 49 y.o. come in for fu side pain and had uti   Back pain better  But ruq pain continues and worse after eating sometimes skips lunch because of sx  No vomiting  No med's for reflux sx   And no choking .  Not sure if had endo Egd in past.   Acid reflux    15 min after eating.   No choking but some  dysphagia  Sometimes at night   Had gyne check  This week and  All ok there .  ROS: See pertinent positives and negatives per HPI.no fever uti sx vomiting   Past Medical History:  Diagnosis Date  . H. pylori infection 2019   Per dr Loreta Ave  . HELLP (hemolytic anemia/elev liver enzymes/low platelets in pregnancy)    c-section 2007  . Hyperlipidemia   . Hypothyroidism   . Pancreatitis    2000 and 2005  . Panic attacks    on meds pre pregnancy/hx    Family History  Problem Relation Age of Onset  . Heart attack Father        in 29s   . Hyperlipidemia Father   . Hypertension Father        gm and gf  . CVA Father        arrynthmia ? af  age 19   . Throat cancer Father   . Arrhythmia Father   . Diabetes Other        gm and gf  . Arthritis Other        gp ? OA  . Hyperlipidemia Mother   . CVA Mother   . Colon cancer Neg Hx   . Stomach cancer Neg Hx     Social History   Socioeconomic History  . Marital status: Married    Spouse name: Not on file  . Number of children: Not on file  . Years of education: Not on file  . Highest education level: Not on file  Occupational History  . Not on file  Tobacco Use  . Smoking status: Never Smoker  . Smokeless tobacco: Never Used  Vaping Use  . Vaping Use: Never used  Substance and Sexual Activity  . Alcohol use: No  . Drug use: No  . Sexual activity: Not on file  Other Topics Concern  . Not on file  Social  History Narrative   HH of 3 pet  Dog     69 yo son child at home [place]   No ets   No caffeine   Drink milk 2-3 x per day    reg exercise    [place]         Social Determinants of Health   Financial Resource Strain:   . Difficulty of Paying Living Expenses: Not on file  Food Insecurity:   . Worried About Programme researcher, broadcasting/film/video in the Last Year: Not on file  . Ran Out of Food in the Last Year: Not on file  Transportation Needs:   . Lack of Transportation (Medical): Not on file  . Lack of Transportation (Non-Medical): Not on file  Physical Activity:   . Days of Exercise per Week: Not on file  . Minutes of  Exercise per Session: Not on file  Stress:   . Feeling of Stress : Not on file  Social Connections:   . Frequency of Communication with Friends and Family: Not on file  . Frequency of Social Gatherings with Friends and Family: Not on file  . Attends Religious Services: Not on file  . Active Member of Clubs or Organizations: Not on file  . Attends Banker Meetings: Not on file  . Marital Status: Not on file    Outpatient Medications Prior to Visit  Medication Sig Dispense Refill  . atorvastatin (LIPITOR) 10 MG tablet TAKE 1 TABLET(10 MG) BY MOUTH DAILY 90 tablet 0  . bifidobacterium infantis (ALIGN) capsule Align 4 mg capsule  Take 1 capsule every day by oral route in the morning.    Marland Kitchen FLUoxetine (PROZAC) 20 MG capsule TAKE 1 CAPSULE(20 MG) BY MOUTH DAILY 90 capsule 1  . levothyroxine (SYNTHROID) 50 MCG tablet TAKE 1 TABLET BY MOUTH  DAILY 90 tablet 3  . MULTIPLE VITAMIN PO Take by mouth daily.    Marland Kitchen sulfamethoxazole-trimethoprim (BACTRIM DS) 800-160 MG tablet Take 1 tablet by mouth 2 (two) times daily. For urine infection 14 tablet 0   No facility-administered medications prior to visit.     EXAM:  BP 110/68   Pulse 75   Temp 98.7 F (37.1 C) (Oral)   Ht 5\' 2"  (1.575 m)   Wt 162 lb (73.5 kg)   SpO2 98%   BMI 29.63 kg/m   Body mass  index is 29.63 kg/m.  GENERAL: vitals reviewed and listed above, alert, oriented, appears well hydrated and in no acute distress HEENT: atraumatic, conjunctiva  clear, no obvious abnormalities on inspection of external nose and ears OP : masked  NECK: no obvious masses on inspection palpation  abd no flank pain  ruq tenderness  No rebound some guarding  And some epigastric tender    CV: HRRR, no clubbing cyanosis or  peripheral edema nl cap refill  MS: moves all extremities without noticeable focal  abnormality PSYCH: pleasant and cooperative, no obvious depression or anxiety Lab Results  Component Value Date   WBC 9.1 11/23/2019   HGB 12.3 11/23/2019   HCT 37.0 11/23/2019   PLT 346 11/23/2019   GLUCOSE 90 11/23/2019   CHOL 179 03/02/2019   TRIG 88.0 03/02/2019   HDL 54.50 03/02/2019   LDLDIRECT 173.7 02/20/2011   LDLCALC 107 (H) 03/02/2019   ALT 13 11/23/2019   AST 16 11/23/2019   NA 140 11/23/2019   K 4.5 11/23/2019   CL 104 11/23/2019   CREATININE 0.81 11/23/2019   BUN 25 11/23/2019   CO2 28 11/23/2019   TSH 3.76 11/23/2019   HGBA1C 5.7 06/13/2016   BP Readings from Last 3 Encounters:  12/07/19 110/68  11/23/19 116/70  03/02/19 116/64   Fu UA is clear   ASSESSMENT AND PLAN:  Discussed the following assessment and plan:  RUQ pain - Plan: 14/08/20 Abdomen Complete  Gastroesophageal reflux disease, unspecified whether esophagitis present  Urinary urgency - uti is better resolved  - Plan: POCT urinalysis dipstick  Dysphagia, unspecified type Pain sounds biliary but also has esophageal sx   On no meds  Get [place] abd    Add ppi and prob need fu with Dr  [place] depending  UTI is better as well as back pain   Not related  Plan fu depending on results   And poss surgery consult  -Patient advised to return or notify health  care team  if  new concerns arise.  Patient Instructions  We are ordering  ultrasound of abdomen to check your  Gall bladder.   In interim take   Omeprazole otc 20 mg every day for the next 2 weeks and then as needed . Dr Loreta Ave may need to follow up the esophagus problem .     Neta Mends. Jonmarc Bodkin M.D.

## 2019-12-07 ENCOUNTER — Encounter: Payer: Self-pay | Admitting: Internal Medicine

## 2019-12-07 ENCOUNTER — Other Ambulatory Visit: Payer: Self-pay

## 2019-12-07 ENCOUNTER — Ambulatory Visit (INDEPENDENT_AMBULATORY_CARE_PROVIDER_SITE_OTHER): Payer: 59 | Admitting: Internal Medicine

## 2019-12-07 VITALS — BP 110/68 | HR 75 | Temp 98.7°F | Ht 62.0 in | Wt 162.0 lb

## 2019-12-07 DIAGNOSIS — K219 Gastro-esophageal reflux disease without esophagitis: Secondary | ICD-10-CM

## 2019-12-07 DIAGNOSIS — R1310 Dysphagia, unspecified: Secondary | ICD-10-CM

## 2019-12-07 DIAGNOSIS — R1011 Right upper quadrant pain: Secondary | ICD-10-CM

## 2019-12-07 DIAGNOSIS — R3915 Urgency of urination: Secondary | ICD-10-CM | POA: Diagnosis not present

## 2019-12-07 LAB — POCT URINALYSIS DIPSTICK
Bilirubin, UA: NEGATIVE
Blood, UA: NEGATIVE
Glucose, UA: NEGATIVE
Ketones, UA: NEGATIVE
Leukocytes, UA: NEGATIVE
Nitrite, UA: NEGATIVE
Protein, UA: NEGATIVE
Spec Grav, UA: 1.02 (ref 1.010–1.025)
Urobilinogen, UA: 0.2 E.U./dL
pH, UA: 6 (ref 5.0–8.0)

## 2019-12-07 NOTE — Patient Instructions (Addendum)
We are ordering  ultrasound of abdomen to check your  Gall bladder.   In interim take  Omeprazole otc 20 mg every day for the next 2 weeks and then as needed . Dr Loreta Ave may need to follow up the esophagus problem .

## 2019-12-08 ENCOUNTER — Ambulatory Visit
Admission: RE | Admit: 2019-12-08 | Discharge: 2019-12-08 | Disposition: A | Discharge status: Home / Self Care | Payer: 59 | Source: Ambulatory Visit | Attending: Internal Medicine | Admitting: Internal Medicine

## 2019-12-08 ENCOUNTER — Other Ambulatory Visit: Payer: Self-pay

## 2019-12-08 ENCOUNTER — Other Ambulatory Visit: Payer: Self-pay | Admitting: Internal Medicine

## 2019-12-08 ENCOUNTER — Telehealth: Payer: Self-pay

## 2019-12-08 DIAGNOSIS — R1011 Right upper quadrant pain: Secondary | ICD-10-CM

## 2019-12-08 NOTE — Telephone Encounter (Signed)
See result note.  

## 2019-12-08 NOTE — Telephone Encounter (Signed)
Tobi Bastos called from [facility] for you to review [place] results as they are in.

## 2019-12-08 NOTE — Progress Notes (Signed)
No galls stones or acute   problems seen  So no obvious gall bladder  problem    Please get her appt with GI Dr Rob Bunting al  soon  I  still think about gall bladder as a cause of her pain  but  need more evaluation . And stay on the omeprazole for at least 2 weeks as we have planned  for the  reflux sx .

## 2020-01-01 ENCOUNTER — Other Ambulatory Visit: Payer: Self-pay | Admitting: Internal Medicine

## 2020-01-14 ENCOUNTER — Other Ambulatory Visit: Payer: Self-pay | Admitting: Surgery

## 2020-01-31 ENCOUNTER — Other Ambulatory Visit: Payer: Self-pay | Admitting: Internal Medicine

## 2020-02-08 ENCOUNTER — Other Ambulatory Visit: Payer: Self-pay

## 2020-02-08 ENCOUNTER — Encounter (HOSPITAL_BASED_OUTPATIENT_CLINIC_OR_DEPARTMENT_OTHER): Payer: Self-pay | Admitting: Surgery

## 2020-02-15 NOTE — Progress Notes (Signed)
      Enhanced Recovery after Surgery for Orthopedics Enhanced Recovery after Surgery is a protocol used to improve the stress on your body and your recovery after surgery.  Patient Instructions  . The night before surgery:  o No food after midnight. ONLY clear liquids after midnight  . The day of surgery (if you do NOT have diabetes):  o Drink ONE (1) Pre-Surgery Clear Ensure as directed.   o This drink was given to you during your hospital  pre-op appointment visit. o The pre-op nurse will instruct you on the time to drink the  Pre-Surgery Ensure depending on your surgery time. o Finish the drink at the designated time by the pre-op nurse.  o Nothing else to drink after completing the  Pre-Surgery Clear Ensure.  . The day of surgery (if you have diabetes): o Drink ONE (1) Gatorade 2 (G2) as directed. o This drink was given to you during your hospital  pre-op appointment visit.  o The pre-op nurse will instruct you on the time to drink the   Gatorade 2 (G2) depending on your surgery time. o Color of the Gatorade may vary. Red is not allowed. o Nothing else to drink after completing the  Gatorade 2 (G2).         If you have questions, please contact your surgeon's office.  Surgical soap and instructions given. Patient verbalized understanding. 

## 2020-02-18 ENCOUNTER — Other Ambulatory Visit (HOSPITAL_COMMUNITY)
Admission: RE | Admit: 2020-02-18 | Discharge: 2020-02-18 | Disposition: A | Discharge status: Home / Self Care | Payer: 59 | Source: Ambulatory Visit | Attending: Surgery | Admitting: Surgery

## 2020-02-18 DIAGNOSIS — Z01812 Encounter for preprocedural laboratory examination: Secondary | ICD-10-CM | POA: Diagnosis present

## 2020-02-18 DIAGNOSIS — Z20822 Contact with and (suspected) exposure to covid-19: Secondary | ICD-10-CM | POA: Insufficient documentation

## 2020-02-18 LAB — SARS CORONAVIRUS 2 (TAT 6-24 HRS): SARS Coronavirus 2: NEGATIVE

## 2020-02-20 NOTE — H&P (Signed)
Karen Fleming  Location: [facility] Patient #: [id number] DOB: Aug 17, 1970 Married / Language: English / Race: White Female   History of Present Illness  The patient is a 49 year old female who presents with abdominal pain.  Chief complaint: Epigastric abdominal pain  This is a 49 year old female referred by Dr. Loreta Ave for evaluation of abdominal pain and biliary dyskinesia. Since last year, she has been having episodes of epigastric abdominal pain, bloating, and some mild nausea with pain to the back after fatty meals. Over the last 2 months, and now occurs daily. She is currently on a low-fat diet and has only mild relief. The pain is moderate in intensity. She had a normal CAT scan of the abdomen and pelvis. She has no gallstones on ultrasound. She has a HIDA scan showing a 40% gallbladder ejection fraction. She denies jaundice. Bowel movements are normal.   Past Surgical History Cesarean Section - Multiple  Colon Polyp Removal - Colonoscopy   Diagnostic Studies History Doristine Devoid, CMA Colonoscopy  1-5 years ago Mammogram  within last year Pap Smear  1-5 years ago  Allergies Doristine Devoid, CMA;  No Known Drug Allergies   Medication History Doristine Devoid, CMA;  Atorvastatin Calcium (10MG  Tablet, Oral) Active. FLUoxetine HCl (20MG  Capsule, Oral) Active. Levothyroxine Sodium ( Tablet, Oral) Active. Medications Reconciled  Pregnancy / Birth History , CMA;   Age at menarche  12 years. Age of menopause  41-50 Gravida  1 Irregular periods  Length (months) of breastfeeding  3-6 Maternal age  21-35 Para  1  Other Problems (Chemira Jones, CMA;  Hypercholesterolemia  Thyroid Disease     Review of Systems 50-56 CMA; General Not Present- Appetite Loss, Chills, Fatigue, Fever, Night Sweats, Weight Gain and Weight Loss. Skin Not Present- Change in Wart/Mole, Dryness, Hives, Jaundice, New Lesions,  Non-Healing Wounds, Rash and Ulcer. HEENT Not Present- Earache, Hearing Loss, Hoarseness, Nose Bleed, Oral Ulcers, Ringing in the Ears, Seasonal Allergies, Sinus Pain, Sore Throat, Visual Disturbances, Wears glasses/contact lenses and Yellow Eyes. Breast Not Present- Breast Mass, Breast Pain, Nipple Discharge and Skin Changes. Cardiovascular Not Present- Chest Pain, Difficulty Breathing Lying Down, Leg Cramps, Palpitations, Rapid Heart Rate, Shortness of Breath and Swelling of Extremities. Gastrointestinal Present- Abdominal Pain and Nausea. Not Present- Bloating, Bloody Stool, Change in Bowel Habits, Chronic diarrhea, Constipation, Difficulty Swallowing, Excessive gas, Gets full quickly at meals, Hemorrhoids, Indigestion, Rectal Pain and Vomiting. Female Genitourinary Not Present- Frequency, Nocturia, Painful Urination, Pelvic Pain and Urgency. Musculoskeletal Not Present- Back Pain, Joint Pain, Joint Stiffness, Muscle Pain, Muscle Weakness and Swelling of Extremities. Neurological Not Present- Decreased Memory, Fainting, Headaches, Numbness, Seizures, Tingling, Tremor, Trouble walking and Weakness. Psychiatric Not Present- Anxiety, Bipolar, Change in Sleep Pattern, Depression, Fearful and Frequent crying. Endocrine Not Present- Cold Intolerance, Excessive Hunger, Hair Changes, Heat Intolerance, Hot flashes and New Diabetes. Hematology Not Present- Blood Thinners, Easy Bruising, Excessive bleeding, Gland problems, HIV and Persistent Infections.  Vitals   Weight: 164.2 lb Height: 62in Body Surface Area: 1.76 m Body Mass Index: 30.03 kg/m  Temp.: 98.34F  Pulse: 83 (Regular)  BP: 112/76(Sitting, Left Arm, Standard)      Physical Exam  The physical exam findings are as follows: Note: She appears well in exam  Her abdomen is soft. There is tenderness with guarding in the epigastrium and right upper quadrant which is mild. There is no hepatomegaly. There are no  hernias    Assessment & Plan  BILIARY DYSKINESIA (K82.8)  Impression: I have reviewed the patients notes and the electronic medical records. I have reviewed her notes from Dr. Loreta Ave. I reviewed her CAT scan, ultrasound, and HIDA scan. She does have biliary colic and I suspect that her dyskinesia with mild chronic cholecystitis. We discussed the diagnosis in detail. We discussed continued conservative management versus proceeding with surgery. I gave her literature regarding laparoscopic cholecystectomy. I discussed the surgical procedure in detail. We discussed the risks which includes but is not limited to bleeding, infection, injury to surrounding structures, the need to convert to an open procedure, the chance this may not resolve her symptoms, cardiopulmonary issues, postoperative recovery, etc. She understands and wishes to proceed with surgery which will be scheduled.

## 2020-02-21 ENCOUNTER — Encounter (HOSPITAL_BASED_OUTPATIENT_CLINIC_OR_DEPARTMENT_OTHER)
Admission: RE | Disposition: A | Discharge status: Home / Self Care | Payer: Self-pay | Source: Home / Self Care | Attending: Surgery

## 2020-02-21 ENCOUNTER — Encounter (HOSPITAL_BASED_OUTPATIENT_CLINIC_OR_DEPARTMENT_OTHER): Payer: Self-pay | Admitting: Surgery

## 2020-02-21 ENCOUNTER — Ambulatory Visit (HOSPITAL_BASED_OUTPATIENT_CLINIC_OR_DEPARTMENT_OTHER): Payer: 59 | Admitting: Certified Registered Nurse Anesthetist

## 2020-02-21 ENCOUNTER — Ambulatory Visit (HOSPITAL_BASED_OUTPATIENT_CLINIC_OR_DEPARTMENT_OTHER)
Admission: RE | Admit: 2020-02-21 | Discharge: 2020-02-21 | Disposition: A | Discharge status: Home / Self Care | Payer: 59 | Attending: Surgery | Admitting: Surgery

## 2020-02-21 ENCOUNTER — Other Ambulatory Visit: Payer: Self-pay

## 2020-02-21 DIAGNOSIS — K811 Chronic cholecystitis: Secondary | ICD-10-CM | POA: Diagnosis present

## 2020-02-21 DIAGNOSIS — I10 Essential (primary) hypertension: Secondary | ICD-10-CM | POA: Insufficient documentation

## 2020-02-21 DIAGNOSIS — Z86010 Personal history of colonic polyps: Secondary | ICD-10-CM | POA: Diagnosis not present

## 2020-02-21 DIAGNOSIS — E669 Obesity, unspecified: Secondary | ICD-10-CM | POA: Insufficient documentation

## 2020-02-21 DIAGNOSIS — Z6830 Body mass index (BMI) 30.0-30.9, adult: Secondary | ICD-10-CM | POA: Insufficient documentation

## 2020-02-21 DIAGNOSIS — E7800 Pure hypercholesterolemia, unspecified: Secondary | ICD-10-CM | POA: Diagnosis not present

## 2020-02-21 HISTORY — PX: CHOLECYSTECTOMY: SHX55

## 2020-02-21 HISTORY — DX: HELLP syndrome (HELLP), unspecified trimester: O14.20

## 2020-02-21 LAB — POCT PREGNANCY, URINE: Preg Test, Ur: NEGATIVE

## 2020-02-21 SURGERY — LAPAROSCOPIC CHOLECYSTECTOMY
Anesthesia: General | Site: Abdomen

## 2020-02-21 MED ORDER — SUGAMMADEX SODIUM 500 MG/5ML IV SOLN
INTRAVENOUS | Status: AC
  Filled 2020-02-21: qty 5

## 2020-02-21 MED ORDER — LIDOCAINE 2% (20 MG/ML) 5 ML SYRINGE
INTRAMUSCULAR | Status: DC | PRN
  Administered 2020-02-21: 80 mg via INTRAVENOUS

## 2020-02-21 MED ORDER — LIDOCAINE 2% (20 MG/ML) 5 ML SYRINGE
INTRAMUSCULAR | Status: AC
  Filled 2020-02-21: qty 5

## 2020-02-21 MED ORDER — CELECOXIB 200 MG PO CAPS
400.0000 mg | ORAL_CAPSULE | ORAL | Status: AC
  Administered 2020-02-21: 400 mg via ORAL

## 2020-02-21 MED ORDER — ONDANSETRON HCL 4 MG/2ML IJ SOLN
INTRAMUSCULAR | Status: AC
  Filled 2020-02-21: qty 2

## 2020-02-21 MED ORDER — PHENYLEPHRINE 40 MCG/ML (10ML) SYRINGE FOR IV PUSH (FOR BLOOD PRESSURE SUPPORT)
PREFILLED_SYRINGE | INTRAVENOUS | Status: DC | PRN
  Administered 2020-02-21: 80 ug via INTRAVENOUS

## 2020-02-21 MED ORDER — MIDAZOLAM HCL 5 MG/5ML IJ SOLN
INTRAMUSCULAR | Status: DC | PRN
  Administered 2020-02-21: 2 mg via INTRAVENOUS

## 2020-02-21 MED ORDER — CEFAZOLIN SODIUM-DEXTROSE 2-4 GM/100ML-% IV SOLN
2.0000 g | INTRAVENOUS | Status: AC
  Administered 2020-02-21: 2 g via INTRAVENOUS

## 2020-02-21 MED ORDER — BUPIVACAINE-EPINEPHRINE (PF) 0.5% -1:200000 IJ SOLN
INTRAMUSCULAR | Status: DC | PRN
  Administered 2020-02-21: 20 mL via PERINEURAL

## 2020-02-21 MED ORDER — MEPERIDINE HCL 25 MG/ML IJ SOLN: 6.2500 mg | INTRAMUSCULAR | Status: DC | PRN

## 2020-02-21 MED ORDER — HYDROMORPHONE HCL 1 MG/ML IJ SOLN
INTRAMUSCULAR | Status: AC
  Filled 2020-02-21: qty 0.5

## 2020-02-21 MED ORDER — FENTANYL CITRATE (PF) 250 MCG/5ML IJ SOLN
INTRAMUSCULAR | Status: DC | PRN
  Administered 2020-02-21 (×2): 50 ug via INTRAVENOUS

## 2020-02-21 MED ORDER — CEFAZOLIN SODIUM-DEXTROSE 2-4 GM/100ML-% IV SOLN
INTRAVENOUS | Status: AC
  Filled 2020-02-21: qty 100

## 2020-02-21 MED ORDER — DEXAMETHASONE SODIUM PHOSPHATE 10 MG/ML IJ SOLN
INTRAMUSCULAR | Status: DC | PRN
  Administered 2020-02-21: 5 mg via INTRAVENOUS

## 2020-02-21 MED ORDER — SUGAMMADEX SODIUM 200 MG/2ML IV SOLN
INTRAVENOUS | Status: DC | PRN
  Administered 2020-02-21: 200 mg via INTRAVENOUS

## 2020-02-21 MED ORDER — ROCURONIUM BROMIDE 10 MG/ML (PF) SYRINGE
PREFILLED_SYRINGE | INTRAVENOUS | Status: AC
  Filled 2020-02-21: qty 10

## 2020-02-21 MED ORDER — LACTATED RINGERS IV SOLN: INTRAVENOUS | Status: DC

## 2020-02-21 MED ORDER — ACETAMINOPHEN 500 MG PO TABS
ORAL_TABLET | ORAL | Status: AC
  Filled 2020-02-21: qty 2

## 2020-02-21 MED ORDER — PROMETHAZINE HCL 25 MG/ML IJ SOLN: 6.2500 mg | INTRAMUSCULAR | Status: DC | PRN

## 2020-02-21 MED ORDER — OXYCODONE HCL 5 MG PO TABS: 5.0000 mg | ORAL_TABLET | Freq: Once | ORAL | Status: DC | PRN

## 2020-02-21 MED ORDER — ACETAMINOPHEN 500 MG PO TABS
1000.0000 mg | ORAL_TABLET | ORAL | Status: AC
  Administered 2020-02-21: 1000 mg via ORAL

## 2020-02-21 MED ORDER — ENSURE PRE-SURGERY PO LIQD: 296.0000 mL | Freq: Once | ORAL | Status: DC

## 2020-02-21 MED ORDER — GABAPENTIN 300 MG PO CAPS
300.0000 mg | ORAL_CAPSULE | ORAL | Status: AC
  Administered 2020-02-21: 300 mg via ORAL

## 2020-02-21 MED ORDER — CELECOXIB 200 MG PO CAPS
ORAL_CAPSULE | ORAL | Status: AC
  Filled 2020-02-21: qty 2

## 2020-02-21 MED ORDER — HYDROMORPHONE HCL 1 MG/ML IJ SOLN
0.2500 mg | INTRAMUSCULAR | Status: DC | PRN
  Administered 2020-02-21 (×2): 0.5 mg via INTRAVENOUS

## 2020-02-21 MED ORDER — PROPOFOL 10 MG/ML IV BOLUS
INTRAVENOUS | Status: AC
  Filled 2020-02-21: qty 20

## 2020-02-21 MED ORDER — CHLORHEXIDINE GLUCONATE CLOTH 2 % EX PADS: 6.0000 | MEDICATED_PAD | Freq: Once | CUTANEOUS | Status: DC

## 2020-02-21 MED ORDER — DEXAMETHASONE SODIUM PHOSPHATE 10 MG/ML IJ SOLN
INTRAMUSCULAR | Status: AC
  Filled 2020-02-21: qty 1

## 2020-02-21 MED ORDER — SODIUM CHLORIDE 0.9 % IR SOLN
Status: DC | PRN
  Administered 2020-02-21: 1000 mL

## 2020-02-21 MED ORDER — PROPOFOL 10 MG/ML IV BOLUS
INTRAVENOUS | Status: DC | PRN
  Administered 2020-02-21: 150 mg via INTRAVENOUS

## 2020-02-21 MED ORDER — OXYCODONE HCL 5 MG/5ML PO SOLN: 5.0000 mg | Freq: Once | ORAL | Status: DC | PRN

## 2020-02-21 MED ORDER — FENTANYL CITRATE (PF) 100 MCG/2ML IJ SOLN
INTRAMUSCULAR | Status: AC
  Filled 2020-02-21: qty 2

## 2020-02-21 MED ORDER — MIDAZOLAM HCL 2 MG/2ML IJ SOLN
INTRAMUSCULAR | Status: AC
  Filled 2020-02-21: qty 2

## 2020-02-21 MED ORDER — ROCURONIUM BROMIDE 10 MG/ML (PF) SYRINGE
PREFILLED_SYRINGE | INTRAVENOUS | Status: DC | PRN
  Administered 2020-02-21: 70 mg via INTRAVENOUS

## 2020-02-21 MED ORDER — GABAPENTIN 300 MG PO CAPS
ORAL_CAPSULE | ORAL | Status: AC
  Filled 2020-02-21: qty 1

## 2020-02-21 MED ORDER — ONDANSETRON HCL 4 MG/2ML IJ SOLN
INTRAMUSCULAR | Status: DC | PRN
  Administered 2020-02-21: 4 mg via INTRAVENOUS

## 2020-02-21 MED ORDER — OXYCODONE HCL 5 MG PO TABS
5.0000 mg | ORAL_TABLET | Freq: Four times a day (QID) | ORAL | 0 refills | Status: DC | PRN
Start: 2020-02-21 — End: 2021-05-02

## 2020-02-21 SURGICAL SUPPLY — 42 items
APL PRP STRL LF DISP 70% ISPRP (MISCELLANEOUS) ×1
APPLIER CLIP 5 13 M/L LIGAMAX5 (MISCELLANEOUS) ×3
BAG SPEC RTRVL LRG 6X4 10 (ENDOMECHANICALS) ×1
BLADE CLIPPER SURG (BLADE) IMPLANT
CHLORAPREP W/TINT 26 (MISCELLANEOUS) ×3 IMPLANT
CLIP APPLIE 5 13 M/L LIGAMAX5 (MISCELLANEOUS) ×1 IMPLANT
COVER MAYO STAND STRL (DRAPES) IMPLANT
COVER WAND RF STERILE (DRAPES) IMPLANT
DECANTER SPIKE VIAL GLASS SM (MISCELLANEOUS) ×3 IMPLANT
DERMABOND ADVANCED (GAUZE/BANDAGES/DRESSINGS) ×2
DERMABOND ADVANCED .7 DNX12 (GAUZE/BANDAGES/DRESSINGS) ×1 IMPLANT
DRAPE C-ARM 42X72 X-RAY (DRAPES) IMPLANT
DRAPE LAPAROSCOPIC ABDOMINAL (DRAPES) IMPLANT
ELECT REM PT RETURN 9FT ADLT (ELECTROSURGICAL) ×3
ELECTRODE REM PT RTRN 9FT ADLT (ELECTROSURGICAL) ×1 IMPLANT
GLOVE BIO SURGEON STRL SZ 6.5 (GLOVE) ×2 IMPLANT
GLOVE BIO SURGEON STRL SZ7 (GLOVE) ×6 IMPLANT
GLOVE BIO SURGEONS STRL SZ 6.5 (GLOVE) ×1
GLOVE BIOGEL PI IND STRL 7.5 (GLOVE) ×1 IMPLANT
GLOVE BIOGEL PI INDICATOR 7.5 (GLOVE) ×2
GLOVE SURG SIGNA 7.5 PF LTX (GLOVE) ×3 IMPLANT
GOWN STRL REUS W/ TWL LRG LVL3 (GOWN DISPOSABLE) ×3 IMPLANT
GOWN STRL REUS W/ TWL XL LVL3 (GOWN DISPOSABLE) ×1 IMPLANT
GOWN STRL REUS W/TWL LRG LVL3 (GOWN DISPOSABLE) ×9
GOWN STRL REUS W/TWL XL LVL3 (GOWN DISPOSABLE) ×3
NS IRRIG 1000ML POUR BTL (IV SOLUTION) ×3 IMPLANT
PACK BASIN DAY SURGERY FS (CUSTOM PROCEDURE TRAY) ×3 IMPLANT
POUCH SPECIMEN RETRIEVAL 10MM (ENDOMECHANICALS) ×3 IMPLANT
SCISSORS LAP 5X35 DISP (ENDOMECHANICALS) ×3 IMPLANT
SET CHOLANGIOGRAPH 5 50 .035 (SET/KITS/TRAYS/PACK) IMPLANT
SET IRRIG TUBING LAPAROSCOPIC (IRRIGATION / IRRIGATOR) ×3 IMPLANT
SET TUBE SMOKE EVAC HIGH FLOW (TUBING) ×3 IMPLANT
SLEEVE ENDOPATH XCEL 5M (ENDOMECHANICALS) ×6 IMPLANT
SLEEVE SCD COMPRESS KNEE MED (MISCELLANEOUS) ×3 IMPLANT
SPECIMEN JAR SMALL (MISCELLANEOUS) ×3 IMPLANT
SUT MON AB 4-0 PC3 18 (SUTURE) ×3 IMPLANT
TOWEL GREEN STERILE FF (TOWEL DISPOSABLE) ×3 IMPLANT
TRAY LAPAROSCOPIC (CUSTOM PROCEDURE TRAY) ×3 IMPLANT
TROCAR XCEL BLUNT TIP 100MML (ENDOMECHANICALS) ×3 IMPLANT
TROCAR XCEL NON-BLD 5MMX100MML (ENDOMECHANICALS) ×3 IMPLANT
TUBE CONNECTING 20'X1/4 (TUBING) ×1
TUBE CONNECTING 20X1/4 (TUBING) ×2 IMPLANT

## 2020-02-21 NOTE — Discharge Instructions (Signed)
Next dose of Tylenol can be given at 2:00pm if needed. Next dose of NSAIDS (Ibuprofen/Motrin/Aleve) can be given at 4:00pm if needed.   Post Anesthesia Home Care Instructions  Activity: Get plenty of rest for the remainder of the day. A responsible individual must stay with you for 24 hours following the procedure.  For the next 24 hours, DO NOT: -Drive a car -Advertising copywriter -Drink alcoholic beverages -Take any medication unless instructed by your physician -Make any legal decisions or sign important papers.  Meals: Start with liquid foods such as gelatin or soup. Progress to regular foods as tolerated. Avoid greasy, spicy, heavy foods. If nausea and/or vomiting occur, drink only clear liquids until the nausea and/or vomiting subsides. Call your physician if vomiting continues.  Special Instructions/Symptoms: Your throat may feel dry or sore from the anesthesia or the breathing tube placed in your throat during surgery. If this causes discomfort, gargle with warm salt water. The discomfort should disappear within 24 hours.    CCS ______[facility] LAPAROSCOPIC SURGERY: POST OP INSTRUCTIONS Always review your discharge instruction sheet given to you by the facility where your surgery was performed. IF YOU HAVE DISABILITY OR FAMILY LEAVE FORMS, YOU MUST BRING THEM TO THE OFFICE FOR PROCESSING.   DO NOT GIVE THEM TO YOUR DOCTOR.  1. A prescription for pain medication may be given to you upon discharge.  Take your pain medication as prescribed, if needed.  If narcotic pain medicine is not needed, then you may take acetaminophen (Tylenol) or ibuprofen (Advil) as needed. 2. Take your usually prescribed medications unless otherwise directed. 3. If you need a refill on your pain medication, please contact your pharmacy.  They will contact our office to request authorization. Prescriptions will not be filled after 5pm or on week-ends. 4. You should follow a light diet  the first few days after arrival home, such as soup and crackers, etc.  Be sure to include lots of fluids daily. 5. Most patients will experience some swelling and bruising in the area of the incisions.  Ice packs will help.  Swelling and bruising can take several days to resolve.  6. It is common to experience some constipation if taking pain medication after surgery.  Increasing fluid intake and taking a stool softener (such as Colace) will usually help or prevent this problem from occurring.  A mild laxative (Milk of Magnesia or Miralax) should be taken according to package instructions if there are no bowel movements after 48 hours. 7. Unless discharge instructions indicate otherwise, you may remove your bandages 24-48 hours after surgery, and you may shower at that time.  You may have steri-strips (small skin tapes) in place directly over the incision.  These strips should be left on the skin for 7-10 days.  If your surgeon used skin glue on the incision, you may shower in 24 hours.  The glue will flake off over the next 2-3 weeks.  Any sutures or staples will be removed at the office during your follow-up visit. 8. ACTIVITIES:  You may resume regular (light) daily activities beginning the next day--such as daily self-care, walking, climbing stairs--gradually increasing activities as tolerated.  You may have sexual intercourse when it is comfortable.  Refrain from any heavy lifting or straining until approved by your doctor. a. You may drive when you are no longer taking prescription pain medication, you can comfortably wear a seatbelt, and you can safely maneuver your car and apply brakes. b. RETURN TO WORK:  __________________________________________________________ 9. You should see your doctor in the office for a follow-up appointment approximately 2-3 weeks after your surgery.  Make sure that you call for this appointment within a day or two after you arrive home to insure a convenient appointment  time. 10. OTHER INSTRUCTIONS:OK TO SHOWER STARTING TOMORROW 11. ICE PACK, TYLENOL, AND IBUPROFEN ALSO FOR PAIN 12. NO LIFTING MORE THAN 15 POUNDS FOR 2 WEEKS __________________________________________________________________________________________________________________________ __________________________________________________________________________________________________________________________ WHEN TO CALL YOUR DOCTOR: 1. Fever over 101.0 2. Inability to urinate 3. Continued bleeding from incision. 4. Increased pain, redness, or drainage from the incision. 5. Increasing abdominal pain  The clinic staff is available to answer your questions during regular business hours.  Please don't hesitate to call and ask to speak to one of the nurses for clinical concerns.  If you have a medical emergency, go to the nearest emergency room or call 911.  A surgeon from [facility] is always on call at the hospital. [address], [place], [place]  [address] ? [address] [address], [place], [place]   [address] [phone] ? [phone] ? FAX [phone] Web site: [email or web address]

## 2020-02-21 NOTE — Anesthesia Procedure Notes (Signed)
Procedure Name: Intubation Performed by: Ezekiel Ina, CRNA Pre-anesthesia Checklist: Patient identified, Emergency Drugs available, Suction available and Patient being monitored Patient Re-evaluated:Patient Re-evaluated prior to induction Oxygen Delivery Method: Circle System Utilized Preoxygenation: Pre-oxygenation with 100% oxygen Induction Type: IV induction Ventilation: Mask ventilation without difficulty Laryngoscope Size: Miller and 2 Tube type: Oral Tube size: 7.0 mm Number of attempts: 1 Airway Equipment and Method: Stylet and Oral airway Placement Confirmation: ETT inserted through vocal cords under direct vision,  positive ETCO2 and breath sounds checked- equal and bilateral Secured at: 21 cm Tube secured with: Tape Dental Injury: Teeth and Oropharynx as per pre-operative assessment

## 2020-02-21 NOTE — Op Note (Signed)
Laparoscopic Cholecystectomy Procedure Note  Indications: This patient presents with symptomatic gallbladder disease and will undergo laparoscopic cholecystectomy.  Pre-operative Diagnosis: Chronic cholecystitis  Post-operative Diagnosis: Same  Surgeon: Abigail Miyamoto, MD  Assistants: Lannette Donath, MD  Anesthesia: General endotracheal anesthesia  ASA Class: 2  Procedure Details  The patient was seen again in the Holding Room. The risks, benefits, complications, treatment options, and expected outcomes were discussed with the patient. The possibilities of reaction to medication, pulmonary aspiration, perforation of viscus, bleeding, recurrent infection, finding a normal gallbladder, the need for additional procedures, failure to diagnose a condition, the possible need to convert to an open procedure, and creating a complication requiring transfusion or operation were discussed with the patient. The likelihood of improving the patient's symptoms with return to their baseline status is good.  The patient and/or family concurred with the proposed plan, giving informed consent. The site of surgery properly noted. The patient was taken to Operating Room, identified as Karen Fleming and the procedure verified as Laparoscopic Cholecystectomy. A Time Out was held and the above information confirmed.  Prior to the induction of general anesthesia, antibiotic prophylaxis was administered. General endotracheal anesthesia was then administered and tolerated well. After the induction, the abdomen was prepped with Chloraprep and draped in sterile fashion. The patient was positioned in the supine position.  A 1.5 cm vertical infraumbilical incision was made. We dissected down to the abdominal fascia with blunt dissection.  The fascia was incised vertically and we entered the peritoneal cavity bluntly.  A pursestring suture of 0-Vicryl was placed around the fascial opening.  The Hasson cannula was  inserted and secured with the stay suture.  Pneumoperitoneum was then created with CO2 and tolerated well without any adverse changes in the patient's vital signs. A 5-mm port was placed in the subxiphoid position.  Two 5-mm ports were placed in the right upper quadrant.  We positioned the patient in reverse Trendelenburg, tilted slightly to the patient's left.  The gallbladder was identified, the fundus grasped and retracted cephalad. Adhesions were lysed bluntly and with the electrocautery where indicated, taking care not to injure any adjacent organs or viscus. The infundibulum was grasped and retracted laterally, exposing the peritoneum overlying the triangle of Calot. This was then divided and exposed in a blunt fashion. The cystic duct was clearly identified and bluntly dissected circumferentially. A critical view of the cystic duct and cystic artery was obtained. The cystic duct was then ligated with clips and divided. The cystic artery was, dissected free, ligated with clips and divided as well.  The gallbladder was dissected from the liver bed in retrograde fashion with the electrocautery. The gallbladder was removed and placed in an Endocatch sac. The liver bed was irrigated and inspected. Hemostasis was achieved with the electrocautery. The gallbladder and Endocatch sac were then removed through the umbilical port site.  The pursestring suture was used to close the umbilical fascia.  We again inspected the right upper quadrant for hemostasis. Copious irrigation was used in the right upper quadrant until clear effluent was observed. Pneumoperitoneum was released as we removed the trocars. Local anesthetic was administered at all trocar sites.  4-0 Monocryl was used to close the skin and surgical glue was applied. The patient was then extubated and brought to the recovery room in stable condition. Instrument, sponge, and needle counts were correct at closure and at the conclusion of the case.    Findings: Chronic Cholecystitis  Estimated Blood Loss: 5 ml  Drains: None         Specimens: Gallbladder           Complications: None; patient tolerated the procedure well.         Disposition: PACU - hemodynamically stable.         Condition: stable

## 2020-02-21 NOTE — Transfer of Care (Signed)
Immediate Anesthesia Transfer of Care Note  Patient: Karen Fleming  Procedure(s) Performed: LAPAROSCOPIC CHOLECYSTECTOMY (N/A Abdomen)  Patient Location: PACU  Anesthesia Type:General  Level of Consciousness: awake  Airway & Oxygen Therapy: Patient Spontanous Breathing  Post-op Assessment: Report given to RN and Post -op Vital signs reviewed and stable  Post vital signs: Reviewed and stable  Last Vitals:  Vitals Value Taken Time  BP    Temp 36.8 C 02/21/20 1007  Pulse 69 02/21/20 1007  Resp 12 02/21/20 1007  SpO2 93 % 02/21/20 1007    Last Pain:  Vitals:   02/21/20 0744  TempSrc: Oral  PainSc: 7       Patients Stated Pain Goal: 7 (02/21/20 0744)  Complications: No complications documented.

## 2020-02-21 NOTE — Anesthesia Preprocedure Evaluation (Signed)
Anesthesia Evaluation  Patient identified by MRN, date of birth, ID band Patient awake    Reviewed: Allergy & Precautions, NPO status , Patient's Chart, lab work & pertinent test results  Airway Mallampati: II  TM Distance: >3 FB Neck ROM: Full    Dental no notable dental hx.    Pulmonary neg pulmonary ROS,    Pulmonary exam normal breath sounds clear to auscultation       Cardiovascular hypertension, Pt. on medications negative cardio ROS Normal cardiovascular exam Rhythm:Regular Rate:Normal     Neuro/Psych Anxiety negative neurological ROS  negative psych ROS   GI/Hepatic negative GI ROS, Neg liver ROS,   Endo/Other  Hypothyroidism   Renal/GU negative Renal ROS  negative genitourinary   Musculoskeletal negative musculoskeletal ROS (+)   Abdominal (+) + obese,   Peds negative pediatric ROS (+)  Hematology negative hematology ROS (+)   Anesthesia Other Findings   Reproductive/Obstetrics negative OB ROS                             Anesthesia Physical Anesthesia Plan  ASA: II  Anesthesia Plan: General   Post-op Pain Management:    Induction: Intravenous  PONV Risk Score and Plan: 3 and Ondansetron, Dexamethasone, Midazolam and Treatment may vary due to age or medical condition  Airway Management Planned: Oral ETT  Additional Equipment:   Intra-op Plan:   Post-operative Plan: Extubation in OR  Informed Consent: I have reviewed the patients History and Physical, chart, labs and discussed the procedure including the risks, benefits and alternatives for the proposed anesthesia with the patient or authorized representative who has indicated his/her understanding and acceptance.     Dental advisory given  Plan Discussed with: CRNA  Anesthesia Plan Comments:         Anesthesia Quick Evaluation

## 2020-02-21 NOTE — Anesthesia Postprocedure Evaluation (Signed)
Anesthesia Post Note  Patient: Karen Fleming  Procedure(s) Performed: LAPAROSCOPIC CHOLECYSTECTOMY (N/A Abdomen)     Patient location during evaluation: PACU Anesthesia Type: General Level of consciousness: awake and alert Pain management: pain level controlled Vital Signs Assessment: post-procedure vital signs reviewed and stable Respiratory status: spontaneous breathing, nonlabored ventilation and respiratory function stable Cardiovascular status: blood pressure returned to baseline and stable Postop Assessment: no apparent nausea or vomiting Anesthetic complications: no   No complications documented.  Last Vitals:  Vitals:   02/21/20 1045 02/21/20 1104  BP: 118/75 119/76  Pulse: 68 69  Resp: 16 18  Temp:  37 C  SpO2: 98% 95%    Last Pain:  Vitals:   02/21/20 1104  TempSrc:   PainSc: (P) 1                  Lowella Curb

## 2020-02-21 NOTE — Interval H&P Note (Signed)
History and Physical Interval Note: no change in H and P  02/21/2020 8:23 AM  Karen Fleming  has presented today for surgery, with the diagnosis of BILIARY DYSKINESIA.  The various methods of treatment have been discussed with the patient and family. After consideration of risks, benefits and other options for treatment, the patient has consented to  Procedure(s): LAPAROSCOPIC CHOLECYSTECTOMY (N/A) as a surgical intervention.  The patient's history has been reviewed, patient examined, no change in status, stable for surgery.  I have reviewed the patient's chart and labs.  Questions were answered to the patient's satisfaction.     Abigail Miyamoto

## 2020-02-22 ENCOUNTER — Encounter (HOSPITAL_BASED_OUTPATIENT_CLINIC_OR_DEPARTMENT_OTHER): Payer: Self-pay | Admitting: Surgery

## 2020-02-22 LAB — SURGICAL PATHOLOGY

## 2020-03-10 ENCOUNTER — Ambulatory Visit: Payer: 59 | Attending: Internal Medicine

## 2020-03-10 DIAGNOSIS — Z23 Encounter for immunization: Secondary | ICD-10-CM

## 2020-03-10 NOTE — Progress Notes (Signed)
   Covid-19 Vaccination Clinic  Name:  Karen Fleming    MRN: [id number] DOB: Aug 18, 1970  03/10/2020  Karen Fleming was observed post Covid-19 immunization for 15 minutes without incident. She was provided with Vaccine Information Sheet and instruction to access the V-Safe system.   Karen Fleming was instructed to call 911 with any severe reactions post vaccine: Marland Kitchen Difficulty breathing  . Swelling of face and throat  . A fast heartbeat  . A bad rash all over body  . Dizziness and weakness   Immunizations Administered    Name Date Dose VIS Date Route   Pfizer COVID-19 Vaccine 03/10/2020  1:23 PM 0.3 mL 01/12/2020 Intramuscular   Manufacturer: [employer], [employer]   Lot: [id number]   NDC: [address]-6

## 2020-04-17 ENCOUNTER — Other Ambulatory Visit: Payer: Self-pay

## 2020-04-17 NOTE — Progress Notes (Signed)
Chief Complaint  Patient presents with   Medication Problem    Atorvastatin concerns.    HPI: Karen Fleming 50 y.o. come in for med check  Med check cholesterol was supposed to increase the atorvastatin to 20 mg but she never got the prescription for that.  Denies any side effects is due for lipid panel. Her right upper quadrant and GI pain and discomfort is resolved after cholecystectomy and she did not get post Coley diarrhea. Is still on the fluoxetine and levothyroxine doing well. No new changes in her medical history she does see her GYN routinely.  ROS: See pertinent positives and negatives per HPI.  Past Medical History:  Diagnosis Date   H. pylori infection 2019   Per dr Loreta Ave   HELLP (hemolytic anemia/elev liver enzymes/low platelets in pregnancy)    c-section 2007   Hemolytic anemia, elevated liver enzymes, low platelets in pregnancy    Hyperlipidemia    Hypothyroidism    Pancreatitis    2000 and 2005   Panic attacks    on meds pre pregnancy/hx    Family History  Problem Relation Age of Onset   Heart attack Father        in 89s    Hyperlipidemia Father    Hypertension Father        gm and gf   CVA Father        arrynthmia ? af  age 25    Throat cancer Father    Arrhythmia Father    Diabetes Other        gm and gf   Arthritis Other        gp ? OA   Hyperlipidemia Mother    CVA Mother    Colon cancer Neg Hx    Stomach cancer Neg Hx     Social History   Socioeconomic History   Marital status: Married    Spouse name: Not on file   Number of children: Not on file   Years of education: Not on file   Highest education level: Not on file  Occupational History   Not on file  Tobacco Use   Smoking status: Never Smoker   Smokeless tobacco: Never Used  Vaping Use   Vaping Use: Never used  Substance and Sexual Activity   Alcohol use: No   Drug use: No   Sexual activity: Not on file  Other Topics Concern   Not on  file  Social History Narrative   HH of 3 pet  Dog     81 yo son child at home [place]   No ets   No caffeine   Drink milk 2-3 x per day    reg exercise    [place]- [place]- [place]         Social Determinants of Health   Financial Resource Strain: Not on file  Food Insecurity: Not on file  Transportation Needs: Not on file  Physical Activity: Not on file  Stress: Not on file  Social Connections: Not on file    Outpatient Medications Prior to Visit  Medication Sig Dispense Refill   MULTIPLE VITAMIN PO Take by mouth daily.     oxyCODONE (OXY IR/ROXICODONE) 5 MG immediate release tablet Take 1 tablet (5 mg total) by mouth every 6 (six) hours as needed for moderate pain or severe pain. 25 tablet 0   atorvastatin (LIPITOR) 10 MG tablet TAKE 1 TABLET(10 MG) BY MOUTH DAILY 90 tablet 0   FLUoxetine (PROZAC) 20 MG  capsule TAKE 1 CAPSULE(20 MG) BY MOUTH DAILY 90 capsule 1   levothyroxine (SYNTHROID) 50 MCG tablet TAKE 1 TABLET(50 MCG) BY MOUTH DAILY 90 tablet 1   No facility-administered medications prior to visit.     EXAM:  BP 118/70    Pulse 68    Temp 98.4 F (36.9 C) (Oral)    Ht 5\' 2"  (1.575 m)    Wt 159 lb 6.4 oz (72.3 kg)    LMP 04/14/2020 (Exact Date)    SpO2 98%    BMI 29.15 kg/m   Body mass index is 29.15 kg/m.  GENERAL: vitals reviewed and listed above, alert, oriented, appears well hydrated and in no acute distress HEENT: atraumatic, conjunctiva  clear, no obvious abnormalities on inspection of external nose and ears OP masked NECK: no obvious masses on inspection LUNGS: clear to auscultation bilaterally, no wheezes, rales or rhonchi, good air movement CV: HRRR, no clubbing cyanosis or  peripheral edema nl cap refill  Abdomen soft without organomegaly healing lap scars no masses MS: moves all extremities without noticeable focal  abnormality PSYCH: pleasant and cooperative, no obvious depression or anxiety Lab Results  Component Value Date   WBC 9.1 11/23/2019    HGB 12.3 11/23/2019   HCT 37.0 11/23/2019   PLT 346 11/23/2019   GLUCOSE 103 (H) 04/18/2020   CHOL 144 04/18/2020   TRIG 94.0 04/18/2020   HDL 49.60 04/18/2020   LDLDIRECT 173.7 02/20/2011   LDLCALC 76 04/18/2020   ALT 23 04/18/2020   AST 20 04/18/2020   NA 137 04/18/2020   K 4.4 04/18/2020   CL 103 04/18/2020   CREATININE 0.83 04/18/2020   BUN 14 04/18/2020   CO2 27 04/18/2020   TSH 1.30 04/18/2020   HGBA1C 5.7 06/13/2016   BP Readings from Last 3 Encounters:  04/18/20 118/70  02/21/20 119/76  12/07/19 110/68   Record lab review. ASSESSMENT AND PLAN:  Discussed the following assessment and plan:  Hypothyroidism, unspecified type - Plan: TSH, TSH, CANCELED: Lipid panel, CANCELED: Hepatic function panel, CANCELED: TSH, CANCELED: Basic metabolic panel, CANCELED: Basic metabolic panel, CANCELED: Lipid panel, CANCELED: Hepatic function panel, CANCELED: TSH  Medication management - Plan: Hepatic Function Panel, Basic Metabolic Panel, Basic Metabolic Panel, Hepatic Function Panel, CANCELED: Lipid panel, CANCELED: Hepatic function panel, CANCELED: TSH, CANCELED: Basic metabolic panel, CANCELED: Basic metabolic panel, CANCELED: Lipid panel, CANCELED: Hepatic function panel, CANCELED: TSH  Hyperlipidemia, unspecified hyperlipidemia type - Plan: Lipid Panel, Basic Metabolic Panel, Basic Metabolic Panel, Lipid Panel, CANCELED: Lipid panel, CANCELED: Hepatic function panel, CANCELED: TSH, CANCELED: Basic metabolic panel, CANCELED: Basic metabolic panel, CANCELED: Lipid panel, CANCELED: Hepatic function panel, CANCELED: TSH  S/P cholecystectomy Plan to repeat her fasting lipid update thyroid and lab monitoring.  On the 10 mg of atorvastatin.  Depending on results we can increase the dose this may have gotten not turned in at that time Patient requested 90 days at a time which I agree to.  After getting results back we will send in 90-day refills. -Patient advised to return or notify health  care team  if  new concerns arise.  Patient Instructions  Lab today Will send in 90 day rx for your meds( 3) .  Glad you are doing well.   cpx visit   In fall of winter       Karis Emig K. Sadako Cegielski M.D.

## 2020-04-18 ENCOUNTER — Encounter: Payer: Self-pay | Admitting: Internal Medicine

## 2020-04-18 ENCOUNTER — Ambulatory Visit (INDEPENDENT_AMBULATORY_CARE_PROVIDER_SITE_OTHER): Payer: 59 | Admitting: Internal Medicine

## 2020-04-18 VITALS — BP 118/70 | HR 68 | Temp 98.4°F | Ht 62.0 in | Wt 159.4 lb

## 2020-04-18 DIAGNOSIS — Z9049 Acquired absence of other specified parts of digestive tract: Secondary | ICD-10-CM

## 2020-04-18 DIAGNOSIS — E785 Hyperlipidemia, unspecified: Secondary | ICD-10-CM

## 2020-04-18 DIAGNOSIS — E039 Hypothyroidism, unspecified: Secondary | ICD-10-CM

## 2020-04-18 DIAGNOSIS — Z79899 Other long term (current) drug therapy: Secondary | ICD-10-CM

## 2020-04-18 LAB — BASIC METABOLIC PANEL
BUN: 14 mg/dL (ref 6–23)
CO2: 27 mEq/L (ref 19–32)
Calcium: 9.9 mg/dL (ref 8.4–10.5)
Chloride: 103 mEq/L (ref 96–112)
Creatinine, Ser: 0.83 mg/dL (ref 0.40–1.20)
GFR: 82.13 mL/min (ref >60.00)
Glucose, Bld: 103 mg/dL — ABNORMAL HIGH (ref 70–99)
Potassium: 4.4 mEq/L (ref 3.5–5.1)
Sodium: 137 mEq/L (ref 135–145)

## 2020-04-18 LAB — HEPATIC FUNCTION PANEL
ALT: 23 U/L (ref 0–35)
AST: 20 U/L (ref 0–37)
Albumin: 4.7 g/dL (ref 3.5–5.2)
Alkaline Phosphatase: 78 U/L (ref 39–117)
Bilirubin, Direct: 0.2 mg/dL (ref 0.0–0.3)
Total Bilirubin: 1.4 mg/dL — ABNORMAL HIGH (ref 0.2–1.2)
Total Protein: 7.4 g/dL (ref 6.0–8.3)

## 2020-04-18 LAB — LIPID PANEL
Cholesterol: 144 mg/dL (ref 0–200)
HDL: 49.6 mg/dL (ref >39.00)
LDL Cholesterol: 76 mg/dL (ref 0–99)
NonHDL: 94.85
Total CHOL/HDL Ratio: 3
Triglycerides: 94 mg/dL (ref 0.0–149.0)
VLDL: 18.8 mg/dL (ref 0.0–40.0)

## 2020-04-18 LAB — TSH: TSH: 1.3 u[IU]/mL (ref 0.35–4.50)

## 2020-04-18 MED ORDER — FLUOXETINE HCL 20 MG PO CAPS
ORAL_CAPSULE | ORAL | 3 refills | Status: DC
Start: 2020-04-18 — End: 2021-05-16

## 2020-04-18 MED ORDER — LEVOTHYROXINE SODIUM 50 MCG PO TABS
ORAL_TABLET | ORAL | 3 refills | Status: DC
Start: 2020-04-18 — End: 2021-05-16

## 2020-04-18 MED ORDER — ATORVASTATIN CALCIUM 10 MG PO TABS
ORAL_TABLET | ORAL | 3 refills | Status: DC
Start: 2020-04-18 — End: 2021-05-16

## 2020-04-18 NOTE — Progress Notes (Signed)
Cholesterol is much better than last time.  We will send in 90 days of your 3 medicines same dose of atorvastatin 10 mg a day should be good dose.  Blood sugar is borderline but no diabetes avoid simple sugars and carbs healthy lifestyle.

## 2020-04-18 NOTE — Patient Instructions (Addendum)
Lab today Will send in 90 day rx for your meds( 3) .  Glad you are doing well.   cpx visit   In fall of winter

## 2020-04-24 NOTE — Progress Notes (Signed)
Mychart message sent. : Cholesterol is much better than last time. We will send in 90 days of your 3 medicines same dose of atorvastatin 10 mg a day should be good dose.   Blood sugar is borderline but no diabetes avoid simple sugars and carbs healthy lifestyle.

## 2021-04-25 ENCOUNTER — Other Ambulatory Visit: Payer: Self-pay | Admitting: Internal Medicine

## 2021-05-01 NOTE — Progress Notes (Signed)
Chief Complaint  Patient presents with   Annual Exam   Abdominal Pain    Not fasting    HPI: Patient  Karen Fleming  51 y.o. comes in today for Preventive Health Care visit  Doing well  today . NF had eggs and water for breakfast Thyroid taking med regular no concerns. HLD taking med no concerns. Prozac doing well wants to continue  Lmpo nov 22 is menopausal only flushes when eats a lot .  Utd on mammogram and ? Pap   And meds  Health Maintenance  Topic Date Due   COVID-19 Vaccine (6 - Booster for Pfizer series) 10/30/2021 (Originally 10/05/2020)   Hepatitis C Screening  10/30/2021 (Originally 10/01/1987)   HIV Screening  10/30/2021 (Originally 09/30/1984)   Zoster Vaccines- Shingrix (1 of 2) 10/30/2021 (Originally 09/30/1988)   MAMMOGRAM  01/06/2022   PAP SMEAR-Modifier  12/06/2022   COLONOSCOPY (Pts 45-37yrs Insurance coverage will need to be confirmed)  04/20/2028   TETANUS/TDAP  05/03/2031   INFLUENZA VACCINE  Completed   HPV VACCINES  Aged Out   Health Maintenance Review LIFESTYLE:  Exercise:   not a lot of exercise in the winter because it is cold Tobacco/ETS:n Alcohol: n Sugar beverages:n Sleep: 7- 8 hours  Drug use: no HH of   2  2 pets son 67 year old she is divorced Work: no out of home    ROS:  REST of 12 system review negative except as per HPI   Past Medical History:  Diagnosis Date   H. pylori infection 2019   Per dr Loreta Ave   HELLP (hemolytic anemia/elev liver enzymes/low platelets in pregnancy)    c-section 2007   Hemolytic anemia, elevated liver enzymes, low platelets in pregnancy    Hives of unknown origin 03/24/2017   on going since  summer 18 uncertain cause no major med changes  to explain . no systemixc sx allergy consult referral    Hyperlipidemia    Hypothyroidism    Pancreatitis    2000 and 2005   Panic attacks    on meds pre pregnancy/hx    Past Surgical History:  Procedure Laterality Date   APPENDECTOMY  2001   CESAREAN  SECTION  2007   CHOLECYSTECTOMY N/A 02/21/2020   Procedure: LAPAROSCOPIC CHOLECYSTECTOMY;  Surgeon: Abigail Miyamoto, MD;  Location: [facility];  Service: General;  Laterality: N/A;    Family History  Problem Relation Age of Onset   Heart attack Father        in 95s    Hyperlipidemia Father    Hypertension Father        gm and gf   CVA Father        arrynthmia ? af  age 54    Throat cancer Father    Arrhythmia Father    Diabetes Other        gm and gf   Arthritis Other        gp ? OA   Hyperlipidemia Mother    CVA Mother    Colon cancer Neg Hx    Stomach cancer Neg Hx     Social History   Socioeconomic History   Marital status: Married    Spouse name: Not on file   Number of children: Not on file   Years of education: Not on file   Highest education level: Not on file  Occupational History   Not on file  Tobacco Use   Smoking status: Never   Smokeless tobacco: Never  Vaping Use   Vaping Use: Never used  Substance and Sexual Activity   Alcohol use: No   Drug use: No   Sexual activity: Not on file  Other Topics Concern   Not on file  Social History Narrative   HH of 3 pet  Dog     629 yo son child at home [place]   No ets   No caffeine   Drink milk 2-3 x per day    reg exercise    [place]         Social Determinants of Health   Financial Resource Strain: Not on file  Food Insecurity: Not on file  Transportation Needs: Not on file  Physical Activity: Not on file  Stress: Not on file  Social Connections: Not on file    Outpatient Medications Prior to Visit  Medication Sig Dispense Refill   atorvastatin (LIPITOR) 10 MG tablet TAKE 1 TABLET(10 MG) BY MOUTH DAILY 90 tablet 3   FLUoxetine (PROZAC) 20 MG capsule TAKE 1 CAPSULE(20 MG) BY MOUTH DAILY 90 capsule 3   levothyroxine (SYNTHROID) 50 MCG tablet TAKE 1 TABLET(50 MCG) BY MOUTH DAILY 90 tablet 3   MULTIPLE VITAMIN PO Take by mouth daily.     oxyCODONE (OXY IR/ROXICODONE)  5 MG immediate release tablet Take 1 tablet (5 mg total) by mouth every 6 (six) hours as needed for moderate pain or severe pain. 25 tablet 0   No facility-administered medications prior to visit.     EXAM:  BP 130/70 (BP Location: Left Arm, Patient Position: Sitting, Cuff Size: Normal)    Pulse 61    Temp 98.4 F (36.9 C) (Oral)    Ht 5\' 2"  (1.575 m)    Wt 173 lb 9.6 oz (78.7 kg)    LMP 04/14/2020 (Exact Date)    SpO2 96%    BMI 31.75 kg/m   Body mass index is 31.75 kg/m. Wt Readings from Last 3 Encounters:  05/02/21 173 lb 9.6 oz (78.7 kg)  04/18/20 159 lb 6.4 oz (72.3 kg)  02/21/20 169 lb 5 oz (76.8 kg)    Physical Exam: Vital signs reviewed RUE:AVWUGEN:This is a well-developed well-nourished alert cooperative    who appearsr stated age in no acute distress.  HEENT: normocephalic atraumatic , Eyes: PERRL EOM's full, conjunctiva clear, glasses, Ears: no deformity EAC's clear TMs with normal landmarks. Mouth: cmasked  NECK: supple without masses, thyromegaly  palpable or bruits. CHEST/PULM:  Clear to auscultation and percussion breath sounds equal no wheeze , rales or rhonchi. No chest wall deformities or tenderness. Breast: normal by inspection . No dimpling, discharge, masses, tenderness or discharge . CV: PMI is nondisplaced, S1 S2 no gallops, murmurs, rubs. Peripheral pulses are full without delay.No JVD .  ABDOMEN: Bowel sounds normal nontender  No guard or rebound, no hepato splenomegal no CVA tenderness.   Extremtities:  No clubbing cyanosis or edema, no acute joint swelling or redness no focal atrophy NEURO:  Oriented x3, cranial nerves 3-12 appear to be intact, no obvious focal weakness,gait within normal limits no abnormal reflexes o SKIN: No acute rashes normal turgor, color, no bruising or petechiae. PSYCH: Oriented, good eye contact, no obvious depression anxiety, cognition and judgment appear normal. LN: no cervical axillary iadenopathy  Lab Results  Component Value Date    WBC 9.1 11/23/2019   HGB 12.3 11/23/2019   HCT 37.0 11/23/2019   PLT 346 11/23/2019   GLUCOSE 103 (H) 04/18/2020   CHOL 144 04/18/2020  TRIG 94.0 04/18/2020   HDL 49.60 04/18/2020   LDLDIRECT 173.7 02/20/2011   LDLCALC 76 04/18/2020   ALT 23 04/18/2020   AST 20 04/18/2020   NA 137 04/18/2020   K 4.4 04/18/2020   CL 103 04/18/2020   CREATININE 0.83 04/18/2020   BUN 14 04/18/2020   CO2 27 04/18/2020   TSH 1.30 04/18/2020   HGBA1C 5.7 06/13/2016   Wt Readings from Last 3 Encounters:  05/02/21 173 lb 9.6 oz (78.7 kg)  04/18/20 159 lb 6.4 oz (72.3 kg)  02/21/20 169 lb 5 oz (76.8 kg)    BP Readings from Last 3 Encounters:  05/02/21 130/70  04/18/20 118/70  02/21/20 119/76    Lab plan reviewed with patient   ASSESSMENT AND PLAN:  Discussed the following assessment and plan:    ICD-10-CM   1. Visit for preventive health examination  Z00.00 Basic metabolic panel    CBC with Differential/Platelet    Hepatic function panel    Lipid panel    TSH    T4, free    Hemoglobin A1c    Tdap vaccine greater than or equal to 7yo IM    Hemoglobin A1c    T4, free    TSH    Lipid panel    Hepatic function panel    CBC with Differential/Platelet    Basic metabolic panel    2. Hypothyroidism, unspecified type  E03.9 Basic metabolic panel    CBC with Differential/Platelet    Hepatic function panel    Lipid panel    TSH    T4, free    Hemoglobin A1c    Tdap vaccine greater than or equal to 7yo IM    Hemoglobin A1c    T4, free    TSH    Lipid panel    Hepatic function panel    CBC with Differential/Platelet    Basic metabolic panel    3. Medication management  Z79.899 Basic metabolic panel    CBC with Differential/Platelet    Hepatic function panel    Lipid panel    TSH    T4, free    Hemoglobin A1c    Tdap vaccine greater than or equal to 7yo IM    Hemoglobin A1c    T4, free    TSH    Lipid panel    Hepatic function panel    CBC with Differential/Platelet     Basic metabolic panel    4. Hyperlipidemia, unspecified hyperlipidemia type  E78.5 Basic metabolic panel    CBC with Differential/Platelet    Hepatic function panel    Lipid panel    TSH    T4, free    Hemoglobin A1c    Tdap vaccine greater than or equal to 7yo IM    Hemoglobin A1c    T4, free    TSH    Lipid panel    Hepatic function panel    CBC with Differential/Platelet    Basic metabolic panel    5. Menopause  Z78.0    Doing well    Ok to wait on shingrix  PNA not high risk  Work on the increase weight . Cont meds  for now .  Consider dec dose fluoxetine.  If needed but doing well Return in about 1 year (around 05/02/2022) for depending on results and how doing..  Patient Care Team: Kiyoshi Schaab, Neta Mends, MD as PCP - General Haygood, Maris Berger, MD (Inactive) (Obstetrics and Gynecology) Charna Elizabeth, MD as Consulting  Physician (Gastroenterology) Patient Instructions  Good to see  you today .  Weight is up and  need to work on getting back down   Wt Readings from Last 3 Encounters:  05/02/21 173 lb 9.6 oz (78.7 kg)  04/18/20 159 lb 6.4 oz (72.3 kg)  02/21/20 169 lb 5 oz (76.8 kg)   Will notify you  of labs when available.  Tdap today .     Neta Mends. Jamal Haskin M.D.

## 2021-05-02 ENCOUNTER — Ambulatory Visit (INDEPENDENT_AMBULATORY_CARE_PROVIDER_SITE_OTHER): Payer: 59 | Admitting: Internal Medicine

## 2021-05-02 ENCOUNTER — Encounter: Payer: Self-pay | Admitting: Internal Medicine

## 2021-05-02 VITALS — BP 130/70 | HR 61 | Temp 98.4°F | Ht 62.0 in | Wt 173.6 lb

## 2021-05-02 DIAGNOSIS — Z0000 Encounter for general adult medical examination without abnormal findings: Secondary | ICD-10-CM

## 2021-05-02 DIAGNOSIS — E039 Hypothyroidism, unspecified: Secondary | ICD-10-CM | POA: Diagnosis not present

## 2021-05-02 DIAGNOSIS — Z780 Asymptomatic menopausal state: Secondary | ICD-10-CM | POA: Diagnosis not present

## 2021-05-02 DIAGNOSIS — Z23 Encounter for immunization: Secondary | ICD-10-CM

## 2021-05-02 DIAGNOSIS — Z79899 Other long term (current) drug therapy: Secondary | ICD-10-CM | POA: Diagnosis not present

## 2021-05-02 DIAGNOSIS — R7989 Other specified abnormal findings of blood chemistry: Secondary | ICD-10-CM | POA: Diagnosis not present

## 2021-05-02 DIAGNOSIS — E785 Hyperlipidemia, unspecified: Secondary | ICD-10-CM | POA: Diagnosis not present

## 2021-05-02 LAB — BASIC METABOLIC PANEL
BUN: 17 mg/dL (ref 6–23)
CO2: 23 mEq/L (ref 19–32)
Calcium: 9.8 mg/dL (ref 8.4–10.5)
Chloride: 104 mEq/L (ref 96–112)
Creatinine, Ser: 0.71 mg/dL (ref 0.40–1.20)
GFR: 98.33 mL/min (ref >60.00)
Glucose, Bld: 92 mg/dL (ref 70–99)
Potassium: 4 mEq/L (ref 3.5–5.1)
Sodium: 139 mEq/L (ref 135–145)

## 2021-05-02 LAB — LIPID PANEL
Cholesterol: 186 mg/dL (ref 0–200)
HDL: 62.3 mg/dL (ref >39.00)
LDL Cholesterol: 98 mg/dL (ref 0–99)
NonHDL: 123.63
Total CHOL/HDL Ratio: 3
Triglycerides: 130 mg/dL (ref 0.0–149.0)
VLDL: 26 mg/dL (ref 0.0–40.0)

## 2021-05-02 LAB — HEPATIC FUNCTION PANEL
ALT: 52 U/L — ABNORMAL HIGH (ref 0–35)
AST: 41 U/L — ABNORMAL HIGH (ref 0–37)
Albumin: 4.2 g/dL (ref 3.5–5.2)
Alkaline Phosphatase: 78 U/L (ref 39–117)
Bilirubin, Direct: 0.2 mg/dL (ref 0.0–0.3)
Total Bilirubin: 1 mg/dL (ref 0.2–1.2)
Total Protein: 7.5 g/dL (ref 6.0–8.3)

## 2021-05-02 LAB — CBC WITH DIFFERENTIAL/PLATELET
Basophils Absolute: 0 10*3/uL (ref 0.0–0.1)
Basophils Relative: 0.7 % (ref 0.0–3.0)
Eosinophils Absolute: 0.2 10*3/uL (ref 0.0–0.7)
Eosinophils Relative: 3.7 % (ref 0.0–5.0)
HCT: 40.1 % (ref 36.0–46.0)
Hemoglobin: 13.2 g/dL (ref 12.0–15.0)
Lymphocytes Relative: 29.5 % (ref 12.0–46.0)
Lymphs Abs: 2 10*3/uL (ref 0.7–4.0)
MCHC: 32.9 g/dL (ref 30.0–36.0)
MCV: 93.4 fl (ref 78.0–100.0)
Monocytes Absolute: 0.7 10*3/uL (ref 0.1–1.0)
Monocytes Relative: 9.7 % (ref 3.0–12.0)
Neutro Abs: 3.8 10*3/uL (ref 1.4–7.7)
Neutrophils Relative %: 56.4 % (ref 43.0–77.0)
Platelets: 289 10*3/uL (ref 150.0–400.0)
RBC: 4.29 Mil/uL (ref 3.87–5.11)
RDW: 12.4 % (ref 11.5–15.5)
WBC: 6.7 10*3/uL (ref 4.0–10.5)

## 2021-05-02 LAB — HEMOGLOBIN A1C: Hgb A1c MFr Bld: 5.9 % (ref 4.6–6.5)

## 2021-05-02 LAB — TSH: TSH: 1.43 u[IU]/mL (ref 0.35–5.50)

## 2021-05-02 LAB — T4, FREE: Free T4: 0.72 ng/dL (ref 0.60–1.60)

## 2021-05-02 NOTE — Patient Instructions (Addendum)
Good to see  you today .  Weight is up and  need to work on getting back down   Wt Readings from Last 3 Encounters:  05/02/21 173 lb 9.6 oz (78.7 kg)  04/18/20 159 lb 6.4 oz (72.3 kg)  02/21/20 169 lb 5 oz (76.8 kg)   Will notify you  of labs when available.  Tdap today .

## 2021-05-16 ENCOUNTER — Other Ambulatory Visit: Payer: Self-pay | Admitting: Internal Medicine

## 2021-05-16 NOTE — Telephone Encounter (Signed)
Last Ov 05/02/21 Filled 04/18/20

## 2021-05-20 NOTE — Progress Notes (Signed)
Results are normal except mild elevation of liver enzymes ;  not diagnostic  of any problem but most often from medication such as tylenol advil and  fatty inflammation of liver .  This  could be related to your weight gain over the past year   and   is best  attended to  with healthy diet.  and exercise    Healthy weight loss may   control reverse this process.  Plan  lfts   hep c aby in 2-3 months .  Dx  abnormal lfts  No change in  medications Review of previous liver imaging studies  is reassuring otherwise but consistent with some fat accumulation .

## 2021-05-22 NOTE — Addendum Note (Signed)
Addended by: Nilda Riggs on: 05/22/2021 09:32 AM   Modules accepted: Orders

## 2021-06-06 ENCOUNTER — Other Ambulatory Visit: Payer: Self-pay | Admitting: Gastroenterology

## 2021-06-06 DIAGNOSIS — R7989 Other specified abnormal findings of blood chemistry: Secondary | ICD-10-CM

## 2021-06-15 ENCOUNTER — Ambulatory Visit
Admission: RE | Admit: 2021-06-15 | Discharge: 2021-06-15 | Disposition: A | Discharge status: Home / Self Care | Payer: 59 | Source: Ambulatory Visit | Attending: Gastroenterology | Admitting: Gastroenterology

## 2021-06-15 DIAGNOSIS — R7989 Other specified abnormal findings of blood chemistry: Secondary | ICD-10-CM

## 2021-07-23 ENCOUNTER — Other Ambulatory Visit (INDEPENDENT_AMBULATORY_CARE_PROVIDER_SITE_OTHER): Payer: 59

## 2021-07-23 DIAGNOSIS — R7989 Other specified abnormal findings of blood chemistry: Secondary | ICD-10-CM

## 2021-07-23 LAB — HEPATIC FUNCTION PANEL
ALT: 29 U/L (ref 0–35)
AST: 26 U/L (ref 0–37)
Albumin: 4.3 g/dL (ref 3.5–5.2)
Alkaline Phosphatase: 83 U/L (ref 39–117)
Bilirubin, Direct: 0.1 mg/dL (ref 0.0–0.3)
Total Bilirubin: 0.9 mg/dL (ref 0.2–1.2)
Total Protein: 7.3 g/dL (ref 6.0–8.3)

## 2021-07-24 LAB — HEPATITIS C ANTIBODY
Hepatitis C Ab: NONREACTIVE
SIGNAL TO CUT-OFF: 0.04 (ref <1.00)

## 2021-07-24 NOTE — Progress Notes (Signed)
Liver panel is now normal as well as hepatitis C screen  ?No further action needed ?routine follow-up.

## 2021-08-09 ENCOUNTER — Ambulatory Visit (INDEPENDENT_AMBULATORY_CARE_PROVIDER_SITE_OTHER): Payer: 59 | Admitting: Internal Medicine

## 2021-08-09 ENCOUNTER — Encounter: Payer: Self-pay | Admitting: Internal Medicine

## 2021-08-09 VITALS — BP 110/70 | HR 64 | Temp 98.4°F | Wt 170.8 lb

## 2021-08-09 DIAGNOSIS — L237 Allergic contact dermatitis due to plants, except food: Secondary | ICD-10-CM

## 2021-08-09 MED ORDER — TRIAMCINOLONE ACETONIDE 0.1 % EX CREA: 1.0000 "application " | TOPICAL_CREAM | Freq: Two times a day (BID) | CUTANEOUS | 0 refills | Status: DC

## 2021-08-09 NOTE — Progress Notes (Signed)
Established Patient Office Visit     CC/Reason for Visit: Itchy rash  HPI: Karen Fleming is a 51 y.o. female who is coming in today for the above mentioned reasons.  2 weeks ago she was cutting some bushes in her house and immediately after started having itching.  She has several areas throughout her arms and torso that are vesicular with linear patterns that intersect it.  It is extremely pruritic.  Past Medical/Surgical History: Past Medical History:  Diagnosis Date   H. pylori infection 2019   Per dr Loreta Ave   HELLP (hemolytic anemia/elev liver enzymes/low platelets in pregnancy)    c-section 2007   Hemolytic anemia, elevated liver enzymes, low platelets in pregnancy    Hives of unknown origin 03/24/2017   on going since  summer 18 uncertain cause no major med changes  to explain . no systemixc sx allergy consult referral    Hyperlipidemia    Hypothyroidism    Pancreatitis    2000 and 2005   Panic attacks    on meds pre pregnancy/hx    Past Surgical History:  Procedure Laterality Date   APPENDECTOMY  2001   CESAREAN SECTION  2007   CHOLECYSTECTOMY N/A 02/21/2020   Procedure: LAPAROSCOPIC CHOLECYSTECTOMY;  Surgeon: Abigail Miyamoto, MD;  Location: [facility];  Service: General;  Laterality: N/A;    Social History:  reports that she has never smoked. She has never used smokeless tobacco. She reports that she does not drink alcohol and does not use drugs.  Allergies: No Known Allergies  Family History:  Family History  Problem Relation Age of Onset   Heart attack Father        in 39s    Hyperlipidemia Father    Hypertension Father        gm and gf   CVA Father        arrynthmia ? af  age 96    Throat cancer Father    Arrhythmia Father    Diabetes Other        gm and gf   Arthritis Other        gp ? OA   Hyperlipidemia Mother    CVA Mother    Colon cancer Neg Hx    Stomach cancer Neg Hx      Current Outpatient Medications:     atorvastatin (LIPITOR) 10 MG tablet, TAKE 1 TABLET(10 MG) BY MOUTH DAILY, Disp: 90 tablet, Rfl: 3   FLUoxetine (PROZAC) 20 MG capsule, TAKE 1 CAPSULE(20 MG) BY MOUTH DAILY, Disp: 90 capsule, Rfl: 3   levothyroxine (SYNTHROID) 50 MCG tablet, TAKE 1 TABLET(50 MCG) BY MOUTH DAILY, Disp: 90 tablet, Rfl: 3   MULTIPLE VITAMIN PO, Take by mouth daily., Disp: , Rfl:    triamcinolone cream (KENALOG) 0.1 %, Apply 1 application. topically 2 (two) times daily., Disp: 30 g, Rfl: 0  Review of Systems:  Constitutional: Denies fever, chills, diaphoresis, appetite change and fatigue.  HEENT: Denies photophobia, eye pain, redness, hearing loss, ear pain, congestion, sore throat, rhinorrhea, sneezing, mouth sores, trouble swallowing, neck pain, neck stiffness and tinnitus.   Respiratory: Denies SOB, DOE, cough, chest tightness,  and wheezing.   Cardiovascular: Denies chest pain, palpitations and leg swelling.  Gastrointestinal: Denies nausea, vomiting, abdominal pain, diarrhea, constipation, blood in stool and abdominal distention.  Genitourinary: Denies dysuria, urgency, frequency, hematuria, flank pain and difficulty urinating.  Endocrine: Denies: hot or cold intolerance, sweats, changes in hair or nails, polyuria, polydipsia. Musculoskeletal:  Denies myalgias, back pain, joint swelling, arthralgias and gait problem.  Skin: Denies pallor and wound.  Neurological: Denies dizziness, seizures, syncope, weakness, light-headedness, numbness and headaches.  Hematological: Denies adenopathy. Easy bruising, personal or family bleeding history  Psychiatric/Behavioral: Denies suicidal ideation, mood changes, confusion, nervousness, sleep disturbance and agitation    Physical Exam: Vitals:   08/09/21 1403  BP: 110/70  Pulse: 64  Temp: 98.4 F (36.9 C)  TempSrc: Oral  SpO2: 98%  Weight: 170 lb 12.8 oz (77.5 kg)    Body mass index is 31.24 kg/m.   Constitutional: NAD, calm, comfortable Eyes: PERRL, lids and  conjunctivae normal ENMT: Mucous membranes are moist. Psychiatric: Normal judgment and insight. Alert and oriented x 3. Normal mood.    Impression and Plan:  Poison ivy  - Plan: triamcinolone cream (KENALOG) 0.1 % -Classic poison ivy rash.    Time spent:22 minutes reviewing chart, interviewing and examining patient and formulating plan of care.   Patient Instructions  -Nice seeing you today!!  -Apply triamcinolone cream daily to affected areas for 1 week.      Chaya Jan, MD [facility]

## 2021-08-09 NOTE — Patient Instructions (Signed)
-  Nice seeing you today!!  -Apply triamcinolone cream daily to affected areas for 1 week.

## 2021-08-17 ENCOUNTER — Encounter: Payer: Self-pay | Admitting: Internal Medicine

## 2021-08-17 LAB — HM COLONOSCOPY

## 2021-08-24 ENCOUNTER — Encounter: Payer: Self-pay | Admitting: Internal Medicine

## 2022-02-21 ENCOUNTER — Ambulatory Visit (INDEPENDENT_AMBULATORY_CARE_PROVIDER_SITE_OTHER): Payer: 59 | Admitting: Adult Health

## 2022-02-21 VITALS — BP 110/80 | HR 57 | Temp 98.0°F

## 2022-02-21 DIAGNOSIS — M5416 Radiculopathy, lumbar region: Secondary | ICD-10-CM | POA: Diagnosis not present

## 2022-02-21 DIAGNOSIS — M5442 Lumbago with sciatica, left side: Secondary | ICD-10-CM | POA: Diagnosis not present

## 2022-02-21 MED ORDER — KETOROLAC TROMETHAMINE 60 MG/2ML IM SOLN
60.0000 mg | Freq: Once | INTRAMUSCULAR | Status: AC
  Administered 2022-02-21: 60 mg via INTRAMUSCULAR

## 2022-02-21 MED ORDER — PREDNISONE 50 MG PO TABS: 50.0000 mg | ORAL_TABLET | Freq: Every day | ORAL | 0 refills | Status: DC

## 2022-02-21 MED ORDER — CYCLOBENZAPRINE HCL 10 MG PO TABS: 10.0000 mg | ORAL_TABLET | Freq: Three times a day (TID) | ORAL | 0 refills | Status: DC | PRN

## 2022-02-21 NOTE — Progress Notes (Signed)
Subjective:    Patient ID: Karen Fleming, female    DOB: May 21, 1970, 51 y.o.   MRN: [id number]  HPI  51 year old female who  has a past medical history of H. pylori infection (2019), HELLP (hemolytic anemia/elev liver enzymes/low platelets in pregnancy), Hemolytic anemia, elevated liver enzymes, low platelets in pregnancy, Hives of unknown origin (03/24/2017), Hyperlipidemia, Hypothyroidism, Pancreatitis, and Panic attacks.  She presents to the office today for an acute issue. She reports left lower back pain x 4 days. Pain radiates down the left leg.  Pain is constant and present with sitting and walking. She is unable to describe the pain. She was gardening the day before the pain.   No loss of bowel or bladder.    At home she has tried using Tylenol, cold packs and heating pad without relief.  Review of Systems See HPI   Past Medical History:  Diagnosis Date   H. pylori infection 2019   Per dr Loreta Ave   HELLP (hemolytic anemia/elev liver enzymes/low platelets in pregnancy)    c-section 2007   Hemolytic anemia, elevated liver enzymes, low platelets in pregnancy    Hives of unknown origin 03/24/2017   on going since  summer 18 uncertain cause no major med changes  to explain . no systemixc sx allergy consult referral    Hyperlipidemia    Hypothyroidism    Pancreatitis    2000 and 2005   Panic attacks    on meds pre pregnancy/hx    Social History   Socioeconomic History   Marital status: Married    Spouse name: Not on file   Number of children: Not on file   Years of education: Not on file   Highest education level: Not on file  Occupational History   Not on file  Tobacco Use   Smoking status: Never   Smokeless tobacco: Never  Vaping Use   Vaping Use: Never used  Substance and Sexual Activity   Alcohol use: No   Drug use: No   Sexual activity: Not on file  Other Topics Concern   Not on file  Social History Narrative   HH of 3 pet  Dog     32 yo son child at  home [place]   No ets   No caffeine   Drink milk 2-3 x per day    reg exercise    [place]         Social Determinants of Health   Financial Resource Strain: Not on file  Food Insecurity: Not on file  Transportation Needs: Not on file  Physical Activity: Not on file  Stress: Not on file  Social Connections: Not on file  Intimate Partner Violence: Not on file    Past Surgical History:  Procedure Laterality Date   APPENDECTOMY  2001   CESAREAN SECTION  2007   CHOLECYSTECTOMY N/A 02/21/2020   Procedure: LAPAROSCOPIC CHOLECYSTECTOMY;  Surgeon: Abigail Miyamoto, MD;  Location: [facility];  Service: General;  Laterality: N/A;    Family History  Problem Relation Age of Onset   Heart attack Father        in 92s    Hyperlipidemia Father    Hypertension Father        gm and gf   CVA Father        arrynthmia ? af  age 40    Throat cancer Father    Arrhythmia Father    Diabetes Other  gm and gf   Arthritis Other        gp ? OA   Hyperlipidemia Mother    CVA Mother    Colon cancer Neg Hx    Stomach cancer Neg Hx     No Known Allergies  Current Outpatient Medications on File Prior to Visit  Medication Sig Dispense Refill   atorvastatin (LIPITOR) 10 MG tablet TAKE 1 TABLET(10 MG) BY MOUTH DAILY 90 tablet 3   FLUoxetine (PROZAC) 20 MG capsule TAKE 1 CAPSULE(20 MG) BY MOUTH DAILY 90 capsule 3   levothyroxine (SYNTHROID) 50 MCG tablet TAKE 1 TABLET(50 MCG) BY MOUTH DAILY 90 tablet 3   MULTIPLE VITAMIN PO Take by mouth daily.     triamcinolone cream (KENALOG) 0.1 % Apply 1 application. topically 2 (two) times daily. 30 g 0   No current facility-administered medications on file prior to visit.    BP 110/80   Pulse (!) 57   Temp 98 F (36.7 C) (Oral)   LMP 04/14/2020 (Exact Date)   SpO2 98%       Objective:   Physical Exam Vitals and nursing note reviewed.  Constitutional:      Appearance: Normal appearance.  Cardiovascular:      Rate and Rhythm: Normal rate and regular rhythm.     Pulses: Normal pulses.     Heart sounds: Normal heart sounds.  Pulmonary:     Effort: Pulmonary effort is normal.     Breath sounds: Normal breath sounds.  Musculoskeletal:        General: Tenderness present. No swelling.     Lumbar back: Spasms and tenderness present. No bony tenderness. Decreased range of motion.       Back:  Skin:    General: Skin is warm and dry.     Capillary Refill: Capillary refill takes less than 2 seconds.  Neurological:     General: No focal deficit present.     Mental Status: She is alert and oriented to person, place, and time. Mental status is at baseline.     Gait: Gait abnormal.  Psychiatric:        Mood and Affect: Mood normal.        Behavior: Behavior normal.        Thought Content: Thought content normal.        Judgment: Judgment normal.       Assessment & Plan:   1. Acute left-sided low back pain with left-sided sciatica -  - predniSONE (DELTASONE) 50 MG tablet; Take 1 tablet (50 mg total) by mouth daily with breakfast.  Dispense: 7 tablet; Refill: 0 - cyclobenzaprine (FLEXERIL) 10 MG tablet; Take 1 tablet (10 mg total) by mouth 3 (three) times daily as needed for muscle spasms.  Dispense: 30 tablet; Refill: 0 - ketorolac (TORADOL) injection 60 mg  2. Lumbar radiculopathy, acute - likely from muscle spasm  - predniSONE (DELTASONE) 50 MG tablet; Take 1 tablet (50 mg total) by mouth daily with breakfast.  Dispense: 7 tablet; Refill: 0 - cyclobenzaprine (FLEXERIL) 10 MG tablet; Take 1 tablet (10 mg total) by mouth 3 (three) times daily as needed for muscle spasms.  Dispense: 30 tablet; Refill: 0 - ketorolac (TORADOL) injection 60 mg   Shirline Frees, NP

## 2022-02-21 NOTE — Patient Instructions (Signed)
It was nice meeting you today   I think you have a pulled muscle in your back that is causing pain.   I have given you a shot of a pain medication called Toradol   I have also sent in a muscle relaxer called Flexeril - this may make you sleepy   I have also sent in a steroid called prednisone

## 2022-05-06 ENCOUNTER — Other Ambulatory Visit: Payer: Self-pay | Admitting: Internal Medicine

## 2022-06-05 ENCOUNTER — Other Ambulatory Visit: Payer: Self-pay | Admitting: Internal Medicine

## 2022-06-06 ENCOUNTER — Ambulatory Visit (INDEPENDENT_AMBULATORY_CARE_PROVIDER_SITE_OTHER): Payer: Medicaid Other | Admitting: Internal Medicine

## 2022-06-06 ENCOUNTER — Encounter: Payer: Self-pay | Admitting: Internal Medicine

## 2022-06-06 VITALS — BP 114/70 | HR 60 | Temp 97.7°F | Ht 62.0 in | Wt 163.4 lb

## 2022-06-06 DIAGNOSIS — F410 Panic disorder [episodic paroxysmal anxiety] without agoraphobia: Secondary | ICD-10-CM | POA: Diagnosis not present

## 2022-06-06 DIAGNOSIS — E039 Hypothyroidism, unspecified: Secondary | ICD-10-CM

## 2022-06-06 DIAGNOSIS — Z79899 Other long term (current) drug therapy: Secondary | ICD-10-CM

## 2022-06-06 DIAGNOSIS — E785 Hyperlipidemia, unspecified: Secondary | ICD-10-CM

## 2022-06-06 LAB — CBC WITH DIFFERENTIAL/PLATELET
Basophils Absolute: 0.1 10*3/uL (ref 0.0–0.1)
Basophils Relative: 0.7 % (ref 0.0–3.0)
Eosinophils Absolute: 0.4 10*3/uL (ref 0.0–0.7)
Eosinophils Relative: 4.8 % (ref 0.0–5.0)
HCT: 40.8 % (ref 36.0–46.0)
Hemoglobin: 13.9 g/dL (ref 12.0–15.0)
Lymphocytes Relative: 34.8 % (ref 12.0–46.0)
Lymphs Abs: 2.6 10*3/uL (ref 0.7–4.0)
MCHC: 34.2 g/dL (ref 30.0–36.0)
MCV: 93.5 fl (ref 78.0–100.0)
Monocytes Absolute: 0.9 10*3/uL (ref 0.1–1.0)
Monocytes Relative: 11.7 % (ref 3.0–12.0)
Neutro Abs: 3.6 10*3/uL (ref 1.4–7.7)
Neutrophils Relative %: 48 % (ref 43.0–77.0)
Platelets: 346 10*3/uL (ref 150.0–400.0)
RBC: 4.36 Mil/uL (ref 3.87–5.11)
RDW: 12.3 % (ref 11.5–15.5)
WBC: 7.5 10*3/uL (ref 4.0–10.5)

## 2022-06-06 LAB — COMPREHENSIVE METABOLIC PANEL
ALT: 21 U/L (ref 0–35)
AST: 24 U/L (ref 0–37)
Albumin: 4.2 g/dL (ref 3.5–5.2)
Alkaline Phosphatase: 73 U/L (ref 39–117)
BUN: 12 mg/dL (ref 6–23)
CO2: 25 mEq/L (ref 19–32)
Calcium: 10.5 mg/dL (ref 8.4–10.5)
Chloride: 104 mEq/L (ref 96–112)
Creatinine, Ser: 0.8 mg/dL (ref 0.40–1.20)
GFR: 84.56 mL/min (ref >60.00)
Glucose, Bld: 103 mg/dL — ABNORMAL HIGH (ref 70–99)
Potassium: 4 mEq/L (ref 3.5–5.1)
Sodium: 139 mEq/L (ref 135–145)
Total Bilirubin: 0.8 mg/dL (ref 0.2–1.2)
Total Protein: 7.4 g/dL (ref 6.0–8.3)

## 2022-06-06 LAB — LIPID PANEL
Cholesterol: 290 mg/dL — ABNORMAL HIGH (ref 0–200)
HDL: 44.7 mg/dL (ref >39.00)
LDL Cholesterol: 206 mg/dL — ABNORMAL HIGH (ref 0–99)
NonHDL: 245.26
Total CHOL/HDL Ratio: 6
Triglycerides: 196 mg/dL — ABNORMAL HIGH (ref 0.0–149.0)
VLDL: 39.2 mg/dL (ref 0.0–40.0)

## 2022-06-06 LAB — TSH: TSH: 1.13 u[IU]/mL (ref 0.35–5.50)

## 2022-06-06 LAB — HEMOGLOBIN A1C: Hgb A1c MFr Bld: 5.8 % (ref 4.6–6.5)

## 2022-06-06 MED ORDER — LEVOTHYROXINE SODIUM 50 MCG PO TABS: ORAL_TABLET | ORAL | 3 refills | Status: DC

## 2022-06-06 MED ORDER — ATORVASTATIN CALCIUM 10 MG PO TABS: ORAL_TABLET | ORAL | 3 refills | Status: DC

## 2022-06-06 MED ORDER — FLUOXETINE HCL 10 MG PO CAPS: 10.0000 mg | ORAL_CAPSULE | Freq: Every day | ORAL | 3 refills | Status: DC

## 2022-06-06 NOTE — Progress Notes (Signed)
Chief Complaint  Patient presents with   Medical Management of Chronic Issues   Medication Refill    On prozac, lipitor and syntroid. Pt is requesting a 90 days supply.    HPI: Karen Fleming 52 y.o. come in for Chronic disease management  med  No major change in health Thyrod:: continues daily  50 mcg per day levothyr Anxiety panic  needs efill fluoexetine 20  doing well no concerns  HLD on atorva 10 needs refill   Sleep :  7 - 8  HH of 2   son in HS  1  pet .  Tobacco n etoh n  Exercise :  swimming 3 x per week  ROS: See pertinent positives and negatives per HPI.  Past Medical History:  Diagnosis Date   H. pylori infection 2019   Per dr Karen Fleming   HELLP (hemolytic anemia/elev liver enzymes/low platelets in pregnancy)    c-section 2007   Hemolytic anemia, elevated liver enzymes, low platelets in pregnancy    Hives of unknown origin 03/24/2017   on going since  summer 18 uncertain cause no major med changes  to explain . no systemixc sx allergy consult referral    Hyperlipidemia    Hypothyroidism    Pancreatitis    2000 and 2005   Panic attacks    on meds pre pregnancy/hx    Family History  Problem Relation Age of Onset   Heart attack Father        in 70s    Hyperlipidemia Father    Hypertension Father        gm and gf   CVA Father        arrynthmia ? af  age 19    Throat cancer Father    Arrhythmia Father    Diabetes Other        gm and gf   Arthritis Other        gp ? OA   Hyperlipidemia Mother    CVA Mother    Colon cancer Neg Hx    Stomach cancer Neg Hx     Social History   Socioeconomic History   Marital status: Married    Spouse name: Not on file   Number of children: Not on file   Years of education: Not on file   Highest education level: Not on file  Occupational History   Not on file  Tobacco Use   Smoking status: Never   Smokeless tobacco: Never  Vaping Use   Vaping Use: Never used  Substance and Sexual Activity   Alcohol use: No    Drug use: No   Sexual activity: Not on file  Other Topics Concern   Not on file  Social History Narrative   HH of 3 pet  Dog     43 yo son child at home [place]   No ets   No caffeine   Drink milk 2-3 x per day    reg exercise    [place]         Social Determinants of Health   Financial Resource Strain: Not on file  Food Insecurity: Not on file  Transportation Needs: Not on file  Physical Activity: Not on file  Stress: Not on file  Social Connections: Not on file    Outpatient Medications Prior to Visit  Medication Sig Dispense Refill   atorvastatin (LIPITOR) 10 MG tablet TAKE 1 TABLET(10 MG) BY MOUTH DAILY.**APPOINTMENT REQUIRED FOR FUTURE REFILLS** 30 tablet 0  FLUoxetine (PROZAC) 20 MG capsule TAKE 1 CAPSULE(20 MG) BY MOUTH DAILY 30 capsule 0   levothyroxine (SYNTHROID) 50 MCG tablet TAKE 1 TABLET(50 MCG) BY MOUTH DAILY 90 tablet 1   cyclobenzaprine (FLEXERIL) 10 MG tablet Take 1 tablet (10 mg total) by mouth 3 (three) times daily as needed for muscle spasms. (Patient not taking: Reported on 06/06/2022) 30 tablet 0   MULTIPLE VITAMIN PO Take by mouth daily. (Patient not taking: Reported on 06/06/2022)     predniSONE (DELTASONE) 50 MG tablet Take 1 tablet (50 mg total) by mouth daily with breakfast. (Patient not taking: Reported on 06/06/2022) 7 tablet 0   triamcinolone cream (KENALOG) 0.1 % Apply 1 application. topically 2 (two) times daily. (Patient not taking: Reported on 06/06/2022) 30 g 0   No facility-administered medications prior to visit.     EXAM:  BP 114/70 (BP Location: Left Arm, Patient Position: Sitting, Cuff Size: Large)   Pulse 60   Temp 97.7 F (36.5 C) (Oral)   Ht 5\' 2"  (1.575 m)   Wt 163 lb 6.4 oz (74.1 kg)   LMP 04/14/2020 (Exact Date)   SpO2 95%   BMI 29.89 kg/m   Body mass index is 29.89 kg/m.  GENERAL: vitals reviewed and listed above, alert, oriented, appears well hydrated and in no acute distress HEENT: atraumatic,  conjunctiva  clear, no obvious abnormalities on inspection of external nose and ears OP : no lesion edema or exudate  NECK: no obvious masses on inspection palpation  LUNGS: clear to auscultation bilaterally, no wheezes, rales or rhonchi, good air movement Abdomen:  Sof,t normal bowel sounds without hepatosplenomegaly, no guarding rebound or masses no CVA tenderness CV: HRRR, no clubbing cyanosis or  peripheral edema nl cap refill  MS: moves all extremities without noticeable focal  abnormality PSYCH: pleasant and cooperative, no obvious depression or anxiety Lab Results  Component Value Date   WBC 7.5 06/06/2022   HGB 13.9 06/06/2022   HCT 40.8 06/06/2022   PLT 346.0 06/06/2022   GLUCOSE 103 (H) 06/06/2022   CHOL 290 (H) 06/06/2022   TRIG 196.0 (H) 06/06/2022   HDL 44.70 06/06/2022   LDLDIRECT 173.7 02/20/2011   LDLCALC 206 (H) 06/06/2022   ALT 21 06/06/2022   AST 24 06/06/2022   NA 139 06/06/2022   K 4.0 06/06/2022   CL 104 06/06/2022   CREATININE 0.80 06/06/2022   BUN 12 06/06/2022   CO2 25 06/06/2022   TSH 1.13 06/06/2022   HGBA1C 5.8 06/06/2022   BP Readings from Last 3 Encounters:  06/06/22 114/70  02/21/22 110/80  08/09/21 110/70    ASSESSMENT AND PLAN:  Discussed the following assessment and plan:  Hypothyroidism, unspecified type - Plan: Comprehensive metabolic panel, TSH, CBC with Differential/Platelet, Hemoglobin A1c  Hyperlipidemia, unspecified hyperlipidemia type - Plan: Comprehensive metabolic panel, TSH, CBC with Differential/Platelet, Hemoglobin A1c, Lipid panel  Medication management - Plan: Comprehensive metabolic panel, TSH, CBC with Differential/Platelet, Hemoglobin A1c  PANIC DISORDER controlled Option to try lower dose of fluoxetine alt 10 20 and 10  Let us know about fluoxetine does as indicated  -Patient advised to return or notify health care team  if  new concerns arise.  Patient Instructions  Good to see you today  Lab results  pending. Can try to dec fluoxetine to 10 mg per day  Can try alternating 10 20 mg and then to 10 mg per day .  If getting  anxiety that doesn't resolve   ok to go back  to 20 mg per day  Let us know either way what dosing   you end up taking.    Karen Fleming. Karen Detert M.D.

## 2022-06-06 NOTE — Patient Instructions (Signed)
Good to see you today  Lab results pending. Can try to dec fluoxetine to 10 mg per day  Can try alternating 10 20 mg and then to 10 mg per day .  If getting  anxiety that doesn't resolve   ok to go back to 20 mg per day  Let us know either way what dosing   you end up taking.

## 2022-06-09 NOTE — Progress Notes (Signed)
Thyroid is in range  Cholesterol is much higher  Intensify lifestyle intervention healthy eating and exercise .  Moderate healthy weight loss  will help get bmi to below 27) Increase atorvastatin to 20 mg per day  Team   : Please send in atorvastatin 20 mg per day disp 90 refill x 1  ( Can use 2 x 10 mg per day of what you already have ) Order  repeat fasting lipid panel in 3-4 months after taking the 20 mg dose .

## 2022-06-10 ENCOUNTER — Other Ambulatory Visit: Payer: Self-pay

## 2022-06-10 DIAGNOSIS — E785 Hyperlipidemia, unspecified: Secondary | ICD-10-CM

## 2022-06-10 MED ORDER — ATORVASTATIN CALCIUM 20 MG PO TABS: 20.0000 mg | ORAL_TABLET | Freq: Every day | ORAL | 1 refills | Status: DC

## 2022-06-30 ENCOUNTER — Other Ambulatory Visit: Payer: Self-pay | Admitting: Internal Medicine

## 2022-07-31 NOTE — Progress Notes (Unsigned)
No chief complaint on file.   HPI: Patient  Karen Fleming  52 y.o. comes in today for Preventive Health Care visit   Health Maintenance  Topic Date Due   HIV Screening  Never done   Zoster Vaccines- Shingrix (1 of 2) Never done   COVID-19 Vaccine (6 - 2023-24 season) 11/23/2021   MAMMOGRAM  01/06/2022   INFLUENZA VACCINE  10/24/2022   PAP SMEAR-Modifier  12/06/2022   COLONOSCOPY (Pts 45-36yrs Insurance coverage will need to be confirmed)  08/18/2026   DTaP/Tdap/Td (3 - Td or Tdap) 05/03/2031   Hepatitis C Screening  Completed   HPV VACCINES  Aged Out   Health Maintenance Review LIFESTYLE:  Exercise:   Tobacco/ETS: Alcohol:  Sugar beverages: Sleep: Drug use: no HH of  Work:    ROS:  GEN/ HEENT: No fever, significant weight changes sweats headaches vision problems hearing changes, CV/ PULM; No chest pain shortness of breath cough, syncope,edema  change in exercise tolerance. GI /GU: No adominal pain, vomiting, change in bowel habits. No blood in the stool. No significant GU symptoms. SKIN/HEME: ,no acute skin rashes suspicious lesions or bleeding. No lymphadenopathy, nodules, masses.  NEURO/ PSYCH:  No neurologic signs such as weakness numbness. No depression anxiety. IMM/ Allergy: No unusual infections.  Allergy .   REST of 12 system review negative except as per HPI   Past Medical History:  Diagnosis Date   H. pylori infection 2019   Per dr Loreta Ave   HELLP (hemolytic anemia/elev liver enzymes/low platelets in pregnancy)    c-section 2007   Hemolytic anemia, elevated liver enzymes, low platelets in pregnancy    Hives of unknown origin 03/24/2017   on going since  summer 18 uncertain cause no major med changes  to explain . no systemixc sx allergy consult referral    Hyperlipidemia    Hypothyroidism    Pancreatitis    2000 and 2005   Panic attacks    on meds pre pregnancy/hx    Past Surgical History:  Procedure Laterality Date   APPENDECTOMY   2001   CESAREAN SECTION  2007   CHOLECYSTECTOMY N/A 02/21/2020   Procedure: LAPAROSCOPIC CHOLECYSTECTOMY;  Surgeon: Abigail Miyamoto, MD;  Location: [facility];  Service: General;  Laterality: N/A;    Family History  Problem Relation Age of Onset   Heart attack Father        in 73s    Hyperlipidemia Father    Hypertension Father        gm and gf   CVA Father        arrynthmia ? af  age 48    Throat cancer Father    Arrhythmia Father    Diabetes Other        gm and gf   Arthritis Other        gp ? OA   Hyperlipidemia Mother    CVA Mother    Colon cancer Neg Hx    Stomach cancer Neg Hx     Social History   Socioeconomic History   Marital status: Married    Spouse name: Not on file   Number of children: Not on file   Years of education: Not on file   Highest education level: Not on file  Occupational History   Not on file  Tobacco Use   Smoking status: Never   Smokeless tobacco: Never  Vaping Use   Vaping Use: Never used  Substance and Sexual Activity   Alcohol  use: No   Drug use: No   Sexual activity: Not on file  Other Topics Concern   Not on file  Social History Narrative   HH of 3 pet  Dog     53 yo son child at home [place]   No ets   No caffeine   Drink milk 2-3 x per day    reg exercise    [place]         Social Determinants of Health   Financial Resource Strain: Not on file  Food Insecurity: Not on file  Transportation Needs: Not on file  Physical Activity: Not on file  Stress: Not on file  Social Connections: Not on file    Outpatient Medications Prior to Visit  Medication Sig Dispense Refill   atorvastatin (LIPITOR) 10 MG tablet TAKE 1 TABLET(10 MG) BY MOUTH DAILY. 90 tablet 3   atorvastatin (LIPITOR) 20 MG tablet Take 1 tablet (20 mg total) by mouth daily. 90 tablet 1   cyclobenzaprine (FLEXERIL) 10 MG tablet Take 1 tablet (10 mg total) by mouth 3 (three) times daily as needed for muscle spasms. (Patient not  taking: Reported on 06/06/2022) 30 tablet 0   FLUoxetine (PROZAC) 10 MG capsule TAKE 1 CAPSULE (10 MG TOTAL) BY MOUTH DAILY. DECREASE DOSAGE FROM 20 MG 90 capsule 4   levothyroxine (SYNTHROID) 50 MCG tablet TAKE 1 TABLET(50 MCG) BY MOUTH DAILY 90 tablet 3   MULTIPLE VITAMIN PO Take by mouth daily. (Patient not taking: Reported on 06/06/2022)     predniSONE (DELTASONE) 50 MG tablet Take 1 tablet (50 mg total) by mouth daily with breakfast. (Patient not taking: Reported on 06/06/2022) 7 tablet 0   triamcinolone cream (KENALOG) 0.1 % Apply 1 application. topically 2 (two) times daily. (Patient not taking: Reported on 06/06/2022) 30 g 0   No facility-administered medications prior to visit.     EXAM:  LMP 04/14/2020 (Exact Date)   There is no height or weight on file to calculate BMI. Wt Readings from Last 3 Encounters:  06/06/22 163 lb 6.4 oz (74.1 kg)  08/09/21 170 lb 12.8 oz (77.5 kg)  05/02/21 173 lb 9.6 oz (78.7 kg)    Physical Exam: Vital signs reviewed ZOX:WRUE is a well-developed well-nourished alert cooperative    who appearsr stated age in no acute distress.  HEENT: normocephalic atraumatic , Eyes: PERRL EOM's full, conjunctiva clear, Nares: paten,t no deformity discharge or tenderness., Ears: no deformity EAC's clear TMs with normal landmarks. Mouth: clear OP, no lesions, edema.  Moist mucous membranes. Dentition in adequate repair. NECK: supple without masses, thyromegaly or bruits. CHEST/PULM:  Clear to auscultation and percussion breath sounds equal no wheeze , rales or rhonchi. No chest wall deformities or tenderness. Breast: normal by inspection . No dimpling, discharge, masses, tenderness or discharge . CV: PMI is nondisplaced, S1 S2 no gallops, murmurs, rubs. Peripheral pulses are full without delay.No JVD .  ABDOMEN: Bowel sounds normal nontender  No guard or rebound, no hepato splenomegal no CVA tenderness.   Extremtities:  No clubbing cyanosis or edema, no acute joint  swelling or redness no focal atrophy NEURO:  Oriented x3, cranial nerves 3-12 appear to be intact, no obvious focal weakness,gait within normal limits no abnormal reflexes or asymmetrical SKIN: No acute rashes normal turgor, color, no bruising or petechiae. PSYCH: Oriented, good eye contact, no obvious depression anxiety, cognition and judgment appear normal. LN: no cervical axillary inguinal adenopathy  Lab Results  Component Value Date  WBC 7.5 06/06/2022   HGB 13.9 06/06/2022   HCT 40.8 06/06/2022   PLT 346.0 06/06/2022   GLUCOSE 103 (H) 06/06/2022   CHOL 290 (H) 06/06/2022   TRIG 196.0 (H) 06/06/2022   HDL 44.70 06/06/2022   LDLDIRECT 173.7 02/20/2011   LDLCALC 206 (H) 06/06/2022   ALT 21 06/06/2022   AST 24 06/06/2022   NA 139 06/06/2022   K 4.0 06/06/2022   CL 104 06/06/2022   CREATININE 0.80 06/06/2022   BUN 12 06/06/2022   CO2 25 06/06/2022   TSH 1.13 06/06/2022   HGBA1C 5.8 06/06/2022    BP Readings from Last 3 Encounters:  06/06/22 114/70  02/21/22 110/80  08/09/21 110/70    Lab results reviewed with patient   ASSESSMENT AND PLAN:  Discussed the following assessment and plan:    ICD-10-CM   1. Visit for preventive health examination  Z00.00     2. Hypothyroidism, unspecified type  E03.9     3. Hyperlipidemia, unspecified hyperlipidemia type  E78.5     4. Medication management  Z79.899      No follow-ups on file.  Patient Care Team: Tyron Manetta, Neta Mends, MD as PCP - General Haygood, Maris Berger, MD (Inactive) (Obstetrics and Gynecology) Charna Elizabeth, MD as Consulting Physician (Gastroenterology) There are no Patient Instructions on file for this visit.  Neta Mends. Lisl Slingerland M.D.

## 2022-08-01 ENCOUNTER — Ambulatory Visit (INDEPENDENT_AMBULATORY_CARE_PROVIDER_SITE_OTHER): Payer: Medicaid Other | Admitting: Internal Medicine

## 2022-08-01 ENCOUNTER — Encounter: Payer: Self-pay | Admitting: Internal Medicine

## 2022-08-01 ENCOUNTER — Telehealth: Payer: Self-pay | Admitting: Internal Medicine

## 2022-08-01 VITALS — BP 106/66 | HR 61 | Temp 97.8°F | Ht 62.0 in | Wt 164.6 lb

## 2022-08-01 DIAGNOSIS — Z79899 Other long term (current) drug therapy: Secondary | ICD-10-CM | POA: Diagnosis not present

## 2022-08-01 DIAGNOSIS — E785 Hyperlipidemia, unspecified: Secondary | ICD-10-CM

## 2022-08-01 DIAGNOSIS — E669 Obesity, unspecified: Secondary | ICD-10-CM | POA: Diagnosis not present

## 2022-08-01 DIAGNOSIS — K760 Fatty (change of) liver, not elsewhere classified: Secondary | ICD-10-CM

## 2022-08-01 DIAGNOSIS — E039 Hypothyroidism, unspecified: Secondary | ICD-10-CM | POA: Diagnosis not present

## 2022-08-01 DIAGNOSIS — Z8249 Family history of ischemic heart disease and other diseases of the circulatory system: Secondary | ICD-10-CM

## 2022-08-01 DIAGNOSIS — Z0000 Encounter for general adult medical examination without abnormal findings: Secondary | ICD-10-CM

## 2022-08-01 MED ORDER — TIRZEPATIDE 2.5 MG/0.5ML ~~LOC~~ SOAJ: 2.5000 mg | SUBCUTANEOUS | 1 refills | Status: DC

## 2022-08-01 NOTE — Patient Instructions (Addendum)
Goodt o see  you today  Continue lifestyle intervention healthy eating and exercise .  Make sure get  your fu lab on 20 mg of atorvastatin. We may end up increasing dose and or adding medication to get the level down  Healthy weight loss may help also .  Will be contacted about the ct coronary calcium scoring . ( Plaque build up)   Will order mounjaro to begin  as discussed  but insurance may not cover ( there are  discount plans  out there)   Plan   2.5 mg subQ once weekly for 4 weeks, then increase to 5 mg subQ once weekly;  Contact in a month on medication  and will decide about going up on doing to 5 mg per week . ( Will need to send in a new rx)   Get shngrix  vaccine at pharmacy to be covered by insurance .

## 2022-08-01 NOTE — Telephone Encounter (Signed)
Pt is calling and tirzepatide [facility]) 2.5 MG/0.5ML Pen  is not cover per pt her insurance will cover ozempic please send new rx to  [id number] - [place], [place] - [address] AT [place] [address] Phone: [phone]  Fax: [phone]

## 2022-08-02 MED ORDER — SEMAGLUTIDE(0.25 OR 0.5MG/DOS) 2 MG/3ML ~~LOC~~ SOPN: 0.2500 mg | PEN_INJECTOR | SUBCUTANEOUS | 1 refills | Status: DC

## 2022-08-02 NOTE — Telephone Encounter (Signed)
Please send in ozempic starter dose take 0.25 [place] q week x 4 weeks  . disp 30 days and refill x 1

## 2022-08-02 NOTE — Telephone Encounter (Signed)
Spoke to pt and updated her on the info. Verbalized understanding.

## 2022-08-02 NOTE — Telephone Encounter (Signed)
[facility] [place]

## 2022-08-02 NOTE — Telephone Encounter (Signed)
Pt called to FU on this Rx.  Pt would like Rx sent to: [id number] - [place], [place] - [address] AT [place] [address] Phone: [phone]  Fax: [phone]     Pt informed MD is OOO on Fridays.

## 2022-08-21 ENCOUNTER — Encounter: Payer: Self-pay | Admitting: Internal Medicine

## 2022-08-22 NOTE — Telephone Encounter (Signed)
Attempted to reach pt. Left a voicemail to call back and also will sent message to mychart.

## 2022-08-22 NOTE — Telephone Encounter (Signed)
So increase to 0.5 mg weekly  and send in rx if needed for this  with refill x1  You should ask pharmacist about  traveling  with med... and also look on airlines site for regulations on medication transport   Let us know how doing after a month on the 0.5 mg

## 2022-08-23 ENCOUNTER — Telehealth: Payer: Self-pay

## 2022-08-23 ENCOUNTER — Other Ambulatory Visit (HOSPITAL_COMMUNITY): Payer: Self-pay

## 2022-08-23 NOTE — Telephone Encounter (Signed)
Received notice from [facility] that pt requires PA for Ozempic, pt is not diabetic and therefore will not be approved. No PA submitted at this time.

## 2022-08-23 NOTE — Telephone Encounter (Signed)
Unfortunately, I do not believe that this would produce any alternate results. Upon further review, I am also seeing that pt has medicaid. Medicaid and Medicare both state that they will not cover weight loss medication [facility]) unless pt has certain cardiovascular risks. Insurances CANNOT cover Ozempic for non diabetic patient's due to FDA regulations.

## 2022-08-23 NOTE — Telephone Encounter (Signed)
Spoke to pt. See telephone encounter.

## 2022-08-23 NOTE — Telephone Encounter (Signed)
Patient made aware that she cannot get semiglutide and is looking for other options. Pt adamant that insurance would cover it. Insurance is on the line and told patient that they would not cover since there isn't a diagnosis of diabetes. Insurance is asking that someone call their PA dept at [phone] to see if there is a way it can be covered.

## 2022-08-26 NOTE — Telephone Encounter (Signed)
Tell her  I dont think it will be covered  but you can send in starter dose of wegovy 0.25 Pearson weekly x 4 weeks

## 2022-08-27 ENCOUNTER — Other Ambulatory Visit: Payer: Self-pay | Admitting: Internal Medicine

## 2022-08-27 MED ORDER — WEGOVY 0.25 MG/0.5ML ~~LOC~~ SOAJ: 0.2500 mg | SUBCUTANEOUS | 0 refills | Status: DC

## 2022-08-27 NOTE — Telephone Encounter (Signed)
Rx sent.   Attempted to reach pt. Left a voicemail to call us when back from her trip.

## 2022-08-30 ENCOUNTER — Other Ambulatory Visit (HOSPITAL_COMMUNITY): Payer: Self-pay

## 2022-09-02 ENCOUNTER — Telehealth: Payer: Self-pay

## 2022-09-02 ENCOUNTER — Other Ambulatory Visit (HOSPITAL_COMMUNITY): Payer: Self-pay

## 2022-09-02 NOTE — Telephone Encounter (Signed)
PA has been DENIED:   [facility] does not cover the following service(s) in the [facility] Plan:  Wegovy Inj 0.25mg  The requested drug is being used for weight loss. Drugs used for this reason are not covered under your plan. Please speak with your doctor about your choices.

## 2022-09-02 NOTE — Telephone Encounter (Signed)
*  Primary  PA request received for [facility] 0.25MG /0.5ML auto-injectors  PA submitted to [employer] and is pending additional questions/determination  Key: [id number]

## 2022-09-04 NOTE — Telephone Encounter (Signed)
Weight loss meds not covered

## 2022-09-06 ENCOUNTER — Other Ambulatory Visit (HOSPITAL_BASED_OUTPATIENT_CLINIC_OR_DEPARTMENT_OTHER): Payer: Medicaid Other

## 2022-09-09 ENCOUNTER — Ambulatory Visit (HOSPITAL_BASED_OUTPATIENT_CLINIC_OR_DEPARTMENT_OTHER)
Admission: RE | Admit: 2022-09-09 | Discharge: 2022-09-09 | Disposition: A | Discharge status: Home / Self Care | Payer: Medicaid Other | Source: Ambulatory Visit | Attending: Internal Medicine | Admitting: Internal Medicine

## 2022-09-09 DIAGNOSIS — E785 Hyperlipidemia, unspecified: Secondary | ICD-10-CM | POA: Insufficient documentation

## 2022-09-09 DIAGNOSIS — K760 Fatty (change of) liver, not elsewhere classified: Secondary | ICD-10-CM | POA: Insufficient documentation

## 2022-09-09 DIAGNOSIS — Z8249 Family history of ischemic heart disease and other diseases of the circulatory system: Secondary | ICD-10-CM | POA: Insufficient documentation

## 2022-09-11 NOTE — Progress Notes (Signed)
Calcium score is zero this is favorable and reassuring .!

## 2022-09-11 NOTE — Progress Notes (Signed)
Normal  ct overread

## 2022-09-12 ENCOUNTER — Ambulatory Visit (INDEPENDENT_AMBULATORY_CARE_PROVIDER_SITE_OTHER): Payer: Medicaid Other | Admitting: Family Medicine

## 2022-09-12 ENCOUNTER — Telehealth: Payer: Self-pay

## 2022-09-12 ENCOUNTER — Encounter: Payer: Self-pay | Admitting: Family Medicine

## 2022-09-12 VITALS — BP 118/70 | HR 76 | Temp 98.7°F | Wt 160.8 lb

## 2022-09-12 DIAGNOSIS — R8290 Unspecified abnormal findings in urine: Secondary | ICD-10-CM | POA: Diagnosis not present

## 2022-09-12 DIAGNOSIS — R300 Dysuria: Secondary | ICD-10-CM

## 2022-09-12 DIAGNOSIS — R3989 Other symptoms and signs involving the genitourinary system: Secondary | ICD-10-CM

## 2022-09-12 LAB — POC URINALSYSI DIPSTICK (AUTOMATED)
Bilirubin, UA: NEGATIVE
Glucose, UA: NEGATIVE
Ketones, UA: NEGATIVE
Nitrite, UA: NEGATIVE
Protein, UA: POSITIVE — AB
Spec Grav, UA: >=1.03 — AB (ref 1.010–1.025)
Urobilinogen, UA: NEGATIVE E.U./dL — AB
pH, UA: 6 (ref 5.0–8.0)

## 2022-09-12 MED ORDER — NITROFURANTOIN MONOHYD MACRO 100 MG PO CAPS
100.0000 mg | ORAL_CAPSULE | Freq: Two times a day (BID) | ORAL | 0 refills | Status: AC
Start: 2022-09-12 — End: 2022-09-19

## 2022-09-12 NOTE — Telephone Encounter (Signed)
Spoke to pt earlier in office after her visit with Dr. Salomon Fick. Pt brought her concerns on Ozempic. Stating that her insurance is waiting for provider to approved the medication.   Explain to pt that her insurance doesn't cover the medication along with [facility].   Also provider doesn't approve it but they send in prescription then prior auth is send in. Then her insurance decided if they would approve it.   Pt states that we need to figure it out because her insurance is saying they are waiting for approval but we state it doesn't approved.   Told pt I can contact her insurance to clarify what's going on about them waiting approval from  provider.   She brought up  that this is her third week without the prescription and request to get done by today.  Told pt, I cannot promise to get it done by today but will try to contact them today.   Spoke to Jerome, Prior Financial controller with [employer].   She states pt plan does not cover medication with indication for weight loss. That applies  to Ozempic and [id number].   Pt can contact them and speak with member services if she needs to.    Attempted reach pt twice. Left voicemail message.

## 2022-09-12 NOTE — Progress Notes (Signed)
Established Patient Office Visit   Subjective  Patient ID: Karen Fleming, female    DOB: Apr 07, 1970  Age: 52 y.o. MRN: [id number]  Chief Complaint  Patient presents with   Dysuria    Started the weekend, tried otc meds but did not work. Just burning when urinates and cloudy.    Patient is a 52 year old female followed by Dr. Fabian Sharp and seen for acute concern.  Patient endorses drinking less water while traveling.  Patient returned on Thursday of last week and started to notice symptoms of dysuria, cloudy urine, urinary frequency.  Patient tried OTC medication on Saturday but symptoms have continued x 1 week.  Tried to increase intake of water.  Denies fever, chills, nausea, vomiting, back pain, suprapubic pressure, constipation, hematuria.  Patient request to speak to Dr. Rosezella Florida nurse to inquire about Ozempic refill.  States was able to get a supply without issues will but was unable to get admitted early as she was to be traveling.  Patient later told pharmacy that med needed approval from PCP.  Patient also has a Ecologist where pharmacy requested [phone] as an alternative however it was declined.  Dysuria       Review of Systems  Genitourinary:  Positive for dysuria.   Negative unless stated above    Objective:     BP 118/70 (BP Location: Right Arm, Patient Position: Sitting, Cuff Size: Normal)   Pulse 76   Temp 98.7 F (37.1 C) (Oral)   Wt 160 lb 12.8 oz (72.9 kg)   LMP 04/14/2020 (Exact Date)   SpO2 94%   BMI 29.41 kg/m    Physical Exam Constitutional:      General: She is not in acute distress.    Appearance: Normal appearance.  HENT:     Head: Normocephalic and atraumatic.     Nose: Nose normal.     Mouth/Throat:     Mouth: Mucous membranes are moist.  Cardiovascular:     Rate and Rhythm: Normal rate and regular rhythm.     Heart sounds: No murmur heard.    No gallop.  Pulmonary:     Effort: Pulmonary effort is normal. No respiratory  distress.     Breath sounds: No wheezing, rhonchi or rales.  Abdominal:     General: Bowel sounds are normal.     Palpations: Abdomen is soft.     Tenderness: There is no abdominal tenderness. There is no right CVA tenderness, left CVA tenderness, guarding or rebound.  Skin:    General: Skin is warm and dry.  Neurological:     Mental Status: She is alert and oriented to person, place, and time.      Results for orders placed or performed in visit on 09/12/22  POCT Urinalysis Dipstick (Automated)  Result Value Ref Range   Color, UA yellow    Clarity, UA cloudy    Glucose, UA Negative Negative   Bilirubin, UA neg    Ketones, UA neg    Spec Grav, UA >=1.030 (A) 1.010 - 1.025   Blood, UA 3+    pH, UA 6.0 5.0 - 8.0   Protein, UA Positive (A) Negative   Urobilinogen, UA negative (A) 0.2 or 1.0 E.U./dL   Nitrite, UA neg    Leukocytes, UA Moderate (2+) (A) Negative      Assessment & Plan:  Suspected UTI -     Nitrofurantoin Monohyd Macro; Take 1 capsule (100 mg total) by mouth 2 (two) times daily for 7 days.  Dispense: 14 capsule; Refill: 0  Dysuria -     POCT Urinalysis Dipstick (Automated) -     Urine Culture  Cloudy urine -     POCT Urinalysis Dipstick (Automated) -     Urine Culture  Start abx for UTI.  Obtain Cx.  Increase fluid intake.  Given precautions.  Return if symptoms worsen or fail to improve.   Deeann Saint, MD

## 2022-09-13 LAB — URINE CULTURE
MICRO NUMBER:: 15107265
SPECIMEN QUALITY:: ADEQUATE

## 2022-09-17 ENCOUNTER — Encounter: Payer: 59 | Admitting: Internal Medicine

## 2022-09-18 NOTE — Telephone Encounter (Signed)
Pt was on the line with [facility] rep and she is now aware medication is not covered due to pre-dm

## 2022-09-18 NOTE — Telephone Encounter (Signed)
Spoke to pt.  Pt reports she spoke to insurance on Monday and they told her it would be covered for pre-diabetes. Inform a Prior Auth representation said otherwise. The medication does not cover under their plan with indication for weight loss. Pt states the insurance contact our office and speak with someone to inform us the medication is covered under pre-diabetes. Told pt, there is no notes regarding to that. Pt is frustrated and states someone is not doing their job and we need to fix this.   Inform pt that I understand her frustration and I'm trying to help her but it hard when we received different answer from her insurance. Ask pt to contact her insurance and have them to call us and speak to [facility]. Gave pt extension and office number. Verbalized understanding.

## 2022-10-08 ENCOUNTER — Encounter: Payer: Self-pay | Admitting: Internal Medicine

## 2022-10-08 ENCOUNTER — Telehealth: Payer: Self-pay | Admitting: Internal Medicine

## 2022-10-08 NOTE — Telephone Encounter (Signed)
 Error. Please disregard

## 2022-10-09 ENCOUNTER — Telehealth (INDEPENDENT_AMBULATORY_CARE_PROVIDER_SITE_OTHER): Payer: Medicaid Other | Admitting: Internal Medicine

## 2022-10-09 ENCOUNTER — Encounter: Payer: Self-pay | Admitting: Internal Medicine

## 2022-10-09 ENCOUNTER — Telehealth: Payer: Self-pay

## 2022-10-09 VITALS — Ht 62.0 in | Wt 170.0 lb

## 2022-10-09 DIAGNOSIS — E785 Hyperlipidemia, unspecified: Secondary | ICD-10-CM

## 2022-10-09 DIAGNOSIS — E8881 Metabolic syndrome: Secondary | ICD-10-CM

## 2022-10-09 DIAGNOSIS — K760 Fatty (change of) liver, not elsewhere classified: Secondary | ICD-10-CM

## 2022-10-09 DIAGNOSIS — Z79899 Other long term (current) drug therapy: Secondary | ICD-10-CM | POA: Diagnosis not present

## 2022-10-09 DIAGNOSIS — E669 Obesity, unspecified: Secondary | ICD-10-CM

## 2022-10-09 MED ORDER — SEMAGLUTIDE(0.25 OR 0.5MG/DOS) 2 MG/3ML ~~LOC~~ SOPN
0.5000 mg | PEN_INJECTOR | SUBCUTANEOUS | 2 refills | Status: DC
Start: 2022-10-09 — End: 2023-08-19

## 2022-10-09 MED ORDER — SEMAGLUTIDE (1 MG/DOSE) 4 MG/3ML ~~LOC~~ SOPN
1.0000 mg | PEN_INJECTOR | SUBCUTANEOUS | 0 refills | Status: DC
Start: 2022-10-09 — End: 2023-08-19

## 2022-10-09 NOTE — Progress Notes (Signed)
Virtual Visit via Video Note  I connected with Karen Fleming on 10/09/22 at  9:15 AM EDT by a video enabled telemedicine application and verified that I am speaking with the correct person using two identifiers. Location patient: home Location provider:work or home office Persons participating in the virtual visit: patient, provider  Patient aware  of the limitations of evaluation and management by telemedicine and  availability of in person appointments. and agreed to proceed.   HPI: Karen Fleming presents for video visit Refill of ozempic was denied and  although med was approved wouldn't refill without a different rx .  Also considering getting a  pen 1 mg dose from [place]  pharmacy with coupon and use 0.5 of the 1 mg  if needed .  Asks if rx can be written in case .  Had no se of medLast dose first week of June  no sx noted  ROS: See pertinent positives and negatives per HPI.  Past Medical History:  Diagnosis Date   H. pylori infection 2019   Per dr Loreta Ave   HELLP (hemolytic anemia/elev liver enzymes/low platelets in pregnancy)    c-section 2007   Hemolytic anemia, elevated liver enzymes, low platelets in pregnancy    Hives of unknown origin 03/24/2017   on going since  summer 18 uncertain cause no major med changes  to explain . no systemixc sx allergy consult referral    Hyperlipidemia    Hypothyroidism    Pancreatitis    2000 and 2005   Panic attacks    on meds pre pregnancy/hx    Past Surgical History:  Procedure Laterality Date   APPENDECTOMY  2001   CESAREAN SECTION  2007   CHOLECYSTECTOMY N/A 02/21/2020   Procedure: LAPAROSCOPIC CHOLECYSTECTOMY;  Surgeon: Abigail Miyamoto, MD;  Location: [facility];  Service: General;  Laterality: N/A;    Family History  Problem Relation Age of Onset   Heart attack Father        in 38s    Hyperlipidemia Father    Hypertension Father        gm and gf   CVA Father        arrynthmia ? af   age 58    Throat cancer Father    Arrhythmia Father    Diabetes Other        gm and gf   Arthritis Other        gp ? OA   Hyperlipidemia Mother    CVA Mother    Colon cancer Neg Hx    Stomach cancer Neg Hx     Social History   Tobacco Use   Smoking status: Never   Smokeless tobacco: Never  Vaping Use   Vaping status: Never Used  Substance Use Topics   Alcohol use: No   Drug use: No      Current Outpatient Medications:    atorvastatin (LIPITOR) 20 MG tablet, Take 1 tablet (20 mg total) by mouth daily., Disp: 90 tablet, Rfl: 1   FLUoxetine (PROZAC) 10 MG capsule, TAKE 1 CAPSULE (10 MG TOTAL) BY MOUTH DAILY. DECREASE DOSAGE FROM 20 MG, Disp: 90 capsule, Rfl: 4   levothyroxine (SYNTHROID) 50 MCG tablet, TAKE 1 TABLET(50 MCG) BY MOUTH DAILY, Disp: 90 tablet, Rfl: 3   Semaglutide, 1 MG/DOSE, 4 MG/3ML SOPN, Inject 1 mg into the skin once a week., Disp: 6 mL, Rfl: 0   Semaglutide,0.25 or 0.5MG /DOS, 2 MG/3ML SOPN, Inject 0.5 mg into the skin  once a week., Disp: 3 mL, Rfl: 2  EXAM: BP Readings from Last 3 Encounters:  09/12/22 118/70  08/01/22 106/66  06/06/22 114/70    VITALS per patient if applicable:  GENERAL: alert, oriented, appears well and in no acute distress   PSYCH/NEURO: pleasant and cooperative, no obvious depression or anxiety, speech and thought processing grossly intact Lab Results  Component Value Date   WBC 7.5 06/06/2022   HGB 13.9 06/06/2022   HCT 40.8 06/06/2022   PLT 346.0 06/06/2022   GLUCOSE 103 (H) 06/06/2022   CHOL 290 (H) 06/06/2022   TRIG 196.0 (H) 06/06/2022   HDL 44.70 06/06/2022   LDLDIRECT 173.7 02/20/2011   LDLCALC 206 (H) 06/06/2022   ALT 21 06/06/2022   AST 24 06/06/2022   NA 139 06/06/2022   K 4.0 06/06/2022   CL 104 06/06/2022   CREATININE 0.80 06/06/2022   BUN 12 06/06/2022   CO2 25 06/06/2022   TSH 1.13 06/06/2022   HGBA1C 5.8 06/06/2022    ASSESSMENT AND PLAN:  Discussed the following assessment and plan:     ICD-10-CM   1. Medication management  Z79.899     2. Metabolic syndrome  E88.810    probabaluw tih fatty liver   high lipids  pre diabetes    3. Hyperlipidemia, unspecified hyperlipidemia type  E78.5     4. Fatty liver  K76.0     5. Obesity with serious comorbidity, unspecified classification, unspecified obesity type  E66.9     Disc  med will send in again indicated  because of metabolic  factors Cv risk   will resend rx for 0.5 mg per week  and then   printed rx incase  declined and would use 0.5 mg per week   Either way 2 mos   fu on med or such to assess ,   will need to fu lipid and A1c  cmplevels   Counseled.   Expectant management and discussion of plan and treatment with opportunity to ask questions and all were answered. The patient agreed with the plan and demonstrated an understanding of the instructions.   Advised to call back or seek an in-person evaluation if worsening  or having  further concerns  in interim. No follow-ups on file.    Berniece Andreas, MD

## 2022-10-09 NOTE — Telephone Encounter (Signed)
Attempted to reach pt regarding to printed Rx for Ozempic.  Left  a voicemail to call us back.

## 2022-10-09 NOTE — Telephone Encounter (Signed)
 See virtual visit from today. 

## 2022-10-10 NOTE — Telephone Encounter (Signed)
Pt return the call. Told FD, Annabelle Harman to inform pt that written prescription for Ozempic is ready for pt. Annabelle Harman update that pt told her she already pick it up. No further action is needed.

## 2022-10-16 ENCOUNTER — Other Ambulatory Visit (HOSPITAL_COMMUNITY): Payer: Self-pay

## 2022-10-16 ENCOUNTER — Telehealth: Payer: Self-pay | Admitting: Pharmacy Technician

## 2022-10-16 NOTE — Telephone Encounter (Signed)
Pharmacy Patient Advocate Encounter   Received notification from CoverMyMeds that prior authorization for Ozempic (0.25 or 0.5 MG/DOSE) 2MG /3ML pen-injectors is required/requested.   Insurance verification completed.   The patient is insured through [facility] MEDICAID .   Per test claim: PA submitted to [facility] MEDICAID via CoverMyMeds Key/confirmation #/EOC [id number] Status is pending

## 2022-10-25 NOTE — Telephone Encounter (Signed)
Pharmacy Patient Advocate Encounter  Received notification from [facility] MEDICAID that Prior Authorization for Ozempic (0.25 or 0.5 MG/DOSE) 2MG /3ML pen-injectors  has been DENIED. Please advise how you'd like to proceed. Full denial letter will be uploaded to the media tab. See denial reason below. Per your health plan's criteria, this drug is covered if you meet the following: You have a disease with high blood sugars (Type 2 diabetes). The information provided does not show that you meet the criteria listed above.

## 2022-10-28 ENCOUNTER — Other Ambulatory Visit (HOSPITAL_COMMUNITY): Payer: Self-pay

## 2023-04-15 ENCOUNTER — Encounter: Payer: Medicaid Other | Admitting: Family Medicine

## 2023-04-18 ENCOUNTER — Telehealth: Payer: Self-pay | Admitting: Internal Medicine

## 2023-04-18 DIAGNOSIS — Z0279 Encounter for issue of other medical certificate: Secondary | ICD-10-CM

## 2023-04-18 NOTE — Telephone Encounter (Signed)
Patient dropped off document  Health Examination Certification  , to be filled out by provider. Patient requested to send it back via Call Patient to pick up within 7-days. Document is located in providers tray at front office.Please advise at [facility] [phone]

## 2023-04-23 ENCOUNTER — Telehealth: Payer: Self-pay | Admitting: Internal Medicine

## 2023-04-23 ENCOUNTER — Telehealth: Payer: Self-pay

## 2023-04-23 DIAGNOSIS — Z111 Encounter for screening for respiratory tuberculosis: Secondary | ICD-10-CM

## 2023-04-23 NOTE — Telephone Encounter (Signed)
Copied from CRM [id number]. Topic: General - Other >> Apr 22, 2023  5:10 PM Karen Fleming wrote: Reason for CRM: Patient wants to know if her General Medical Form is ready for pick up. 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 Please call patient.

## 2023-04-23 NOTE — Telephone Encounter (Signed)
Attempted to contact pt about the form on the TB Skin part.   Left detail message that pt may need a TB skin, if work place requires it. Pt needs to schedule an appt. Advise pt to call us back on voicemail.

## 2023-04-23 NOTE — Telephone Encounter (Signed)
Attempted to reach pt. Left a detail message to call us back regards to the form.

## 2023-04-23 NOTE — Telephone Encounter (Signed)
Received reports from front desk, Tammy, stating that pt is in office and said she received a call that the form is ready for her.   Inform Tammy that this cma did not call pt about the form. Tammy relay message to pt. Tammy reports pt had told her she needs the form to be done by 4:00pm today. Advise Tammy to inform that the message will relay back to provider but can't promise it will get done today.    Routing to provider.

## 2023-04-24 ENCOUNTER — Other Ambulatory Visit: Payer: Medicaid Other

## 2023-04-24 NOTE — Telephone Encounter (Signed)
Pt came in and lab is closed.   Original copy was given to pt to bring with her.   Pt will come in another day to get lab done.

## 2023-04-24 NOTE — Telephone Encounter (Signed)
Attempted to call pt to follow up on her form. Would like get info on her immunization. Left a voicemail to call us back.

## 2023-04-24 NOTE — Telephone Encounter (Signed)
Spoke to pt about the form. Inform provider is still working on it. And may have it ready for today.   As for TB skin, she may need an appt for tb skin test or quantiferon Gold which she doesn't have to come back. Pt would like to do blood draw TB. Appt scheduled today @4 :20pm.   Pt states she will be 5 minutes late. Inform pt our lab is closed at 4:30pm, if she be there before 4:30pm.   Pt verbalized understanding and states she will be there.

## 2023-04-24 NOTE — Addendum Note (Signed)
Addended by: Vickii Chafe on: 04/24/2023 11:56 AM   Modules accepted: Orders

## 2023-04-25 NOTE — Telephone Encounter (Signed)
I singed form  but need  tb assesment  Ask about Hep B vaccine and MMR as we do not have   info on those vaccines  Also did she have varicella  or the vaccine ?

## 2023-04-25 NOTE — Telephone Encounter (Signed)
Form was address yesterday to pt.   Original copy was given to patient.   Pt is to reschedule for her  TB gold quantiferon lab.   As for immunization, pt states she doesn't need it at this time.

## 2023-05-19 DIAGNOSIS — Z0279 Encounter for issue of other medical certificate: Secondary | ICD-10-CM

## 2023-05-20 ENCOUNTER — Other Ambulatory Visit: Payer: Self-pay | Admitting: Internal Medicine

## 2023-05-21 ENCOUNTER — Other Ambulatory Visit: Payer: Self-pay | Admitting: Internal Medicine

## 2023-08-06 ENCOUNTER — Other Ambulatory Visit: Payer: Self-pay | Admitting: Family

## 2023-08-14 ENCOUNTER — Telehealth: Payer: Self-pay | Admitting: Internal Medicine

## 2023-08-14 ENCOUNTER — Telehealth: Payer: Self-pay | Admitting: *Deleted

## 2023-08-14 NOTE — Telephone Encounter (Signed)
 Pt has been sch for 08-19-2022 330 pm

## 2023-08-14 NOTE — Telephone Encounter (Signed)
 Copied from CRM [id number]. Topic: Appointments - Appointment Scheduling >> Aug 14, 2023 12:46 PM Karen Fleming wrote: Patient returning Karpiuh's call, she needs her appointment to be before 08/22/23 early in the morning or after 4:15pm but there's nothing available within that time frame - patient requested call back on what she can do? She is a Runner, broadcasting/film/video and cannot miss work

## 2023-08-14 NOTE — Telephone Encounter (Signed)
 FMLA forms to be filled out- placed in dr's folder--please call pt upon completion for pick up.  *The forms in the back of the packet that are in Spanish is for proof of her dad's passing.

## 2023-08-16 ENCOUNTER — Other Ambulatory Visit: Payer: Self-pay | Admitting: Internal Medicine

## 2023-08-19 ENCOUNTER — Encounter: Payer: Self-pay | Admitting: Internal Medicine

## 2023-08-19 ENCOUNTER — Ambulatory Visit (INDEPENDENT_AMBULATORY_CARE_PROVIDER_SITE_OTHER): Payer: Self-pay | Admitting: Internal Medicine

## 2023-08-19 VITALS — BP 96/76 | HR 67 | Temp 97.7°F | Ht 62.0 in | Wt 166.8 lb

## 2023-08-19 DIAGNOSIS — E8881 Metabolic syndrome: Secondary | ICD-10-CM

## 2023-08-19 DIAGNOSIS — Z8249 Family history of ischemic heart disease and other diseases of the circulatory system: Secondary | ICD-10-CM

## 2023-08-19 DIAGNOSIS — E785 Hyperlipidemia, unspecified: Secondary | ICD-10-CM

## 2023-08-19 DIAGNOSIS — Z79899 Other long term (current) drug therapy: Secondary | ICD-10-CM

## 2023-08-19 DIAGNOSIS — Z634 Disappearance and death of family member: Secondary | ICD-10-CM | POA: Diagnosis not present

## 2023-08-19 DIAGNOSIS — Z111 Encounter for screening for respiratory tuberculosis: Secondary | ICD-10-CM

## 2023-08-19 DIAGNOSIS — L255 Unspecified contact dermatitis due to plants, except food: Secondary | ICD-10-CM

## 2023-08-19 DIAGNOSIS — K760 Fatty (change of) liver, not elsewhere classified: Secondary | ICD-10-CM

## 2023-08-19 DIAGNOSIS — E039 Hypothyroidism, unspecified: Secondary | ICD-10-CM | POA: Diagnosis not present

## 2023-08-19 MED ORDER — SEMAGLUTIDE (1 MG/DOSE) 4 MG/3ML ~~LOC~~ SOPN: 1.0000 mg | PEN_INJECTOR | SUBCUTANEOUS | 3 refills | Status: AC

## 2023-08-19 MED ORDER — FLUOCINONIDE 0.05 % EX CREA: 1.0000 | TOPICAL_CREAM | Freq: Two times a day (BID) | CUTANEOUS | 2 refills | Status: AC

## 2023-08-19 NOTE — Telephone Encounter (Signed)
 Noted

## 2023-08-19 NOTE — Progress Notes (Signed)
 Chief Complaint  Patient presents with   FMLA Form   Rash    Pt c/o rash, itchy, blistering all over her body after working her on her garden. Not sure if its from poison ivy, from mosquito bites. Happens 2 days ago. Tried allerga otc. Aloe vera gel helps with itching. Tried exederm- gave more redness. Started out with arm then move to neck, now sx on lower legs.     HPI: Stellarose Cerny [id number] 53 y.o. come in for sign fmla form cause had to leave country when father had sudden death in sleep felt to be heart related and  went to [place]   for help and [employer] ceremony   Is back to work but needs fmla cause didn't have enough sick leave.  Works it  Pension scheme manager  at [employer]   3-4 days of itchy rash on distal  extremities   did work in garden  before began  used   sun burn gel and  ok  used otc hcs pret and felt a hot reaction so  stopped .   Due for lab update and meds   Past Medical History:  Diagnosis Date   H. pylori infection 2019   Per dr Tova Fresh   HELLP (hemolytic anemia/elev liver enzymes/low platelets in pregnancy)    c-section 2007   Hemolytic anemia, elevated liver enzymes, low platelets in pregnancy    Hives of unknown origin 03/24/2017   on going since  summer 17 uncertain cause no major med changes  to explain . no systemixc sx allergy consult referral    Hyperlipidemia    Hypothyroidism    Pancreatitis    2000 and 2005   Panic attacks    on meds pre pregnancy/hx    Family History  Problem Relation Age of Onset   Heart attack Father        in 39s    Hyperlipidemia Father    Hypertension Father        gm and gf   CVA Father        arrynthmia ? af  age 62    Throat cancer Father    Arrhythmia Father    Diabetes Other        gm and gf   Arthritis Other        gp ? OA   Hyperlipidemia Mother    CVA Mother    Colon cancer Neg Hx    Stomach cancer Neg Hx     Social History   Socioeconomic History   Marital status: Divorced    Spouse  name: Not on file   Number of children: Not on file   Years of education: Not on file   Highest education level: Not on file  Occupational History   Not on file  Tobacco Use   Smoking status: Never   Smokeless tobacco: Never  Vaping Use   Vaping status: Never Used  Substance and Sexual Activity   Alcohol use: No   Drug use: No   Sexual activity: Not on file  Other Topics Concern   Not on file  Social History Narrative   HH of 3 pet  Dog     58 yo son child at home [place]   No ets   No caffeine   Drink milk 2-3 x per day    reg exercise    [place]         Social Drivers of Health  Financial Resource Strain: Low Risk  (08/01/2022)   Overall Financial Resource Strain (CARDIA)    Difficulty of Paying Living Expenses: Not hard at all  Food Insecurity: No Food Insecurity (08/01/2022)   Hunger Vital Sign    Worried About Running Out of Food in the Last Year: Never true    Ran Out of Food in the Last Year: Never true  Transportation Needs: No Transportation Needs (08/01/2022)   PRAPARE - Administrator, Civil Service (Medical): No    Lack of Transportation (Non-Medical): No  Physical Activity: Insufficiently Active (08/01/2022)   Exercise Vital Sign    Days of Exercise per Week: 3 days    Minutes of Exercise per Session: 30 min  Stress: No Stress Concern Present (08/01/2022)   [employer] of Occupational Health - Occupational Stress Questionnaire    Feeling of Stress : Not at all  Social Connections: Socially Isolated (08/01/2022)   Social Connection and Isolation Panel [NHANES]    Frequency of Communication with Friends and Family: More than three times a week    Frequency of Social Gatherings with Friends and Family: Never    Attends Religious Services: Never    Database administrator or Organizations: No    Attends Engineer, structural: Never    Marital Status: Divorced    Outpatient Medications Prior to Visit  Medication Sig Dispense  Refill   atorvastatin  (LIPITOR) 20 MG tablet TAKE 1 TABLET BY MOUTH EVERY DAY 90 tablet 1   FLUoxetine  (PROZAC ) 10 MG capsule TAKE 1 CAPSULE (10 MG TOTAL) BY MOUTH DAILY. DECREASE DOSAGE FROM 20 MG 90 capsule 4   levothyroxine  (SYNTHROID ) 50 MCG tablet TAKE 1 TABLET (50 MCG) BY MOUTH DAILY 90 tablet 0   Semaglutide , 1 MG/DOSE, 4 MG/3ML SOPN Inject 1 mg into the skin once a week. 6 mL 0   Semaglutide ,0.25 or 0.5MG /DOS, 2 MG/3ML SOPN Inject 0.5 mg into the skin once a week. (Patient not taking: Reported on 08/19/2023) 3 mL 2   No facility-administered medications prior to visit.     EXAM:  BP 96/76 (BP Location: Left Arm, Patient Position: Sitting, Cuff Size: Normal)   Pulse 67   Temp 97.7 F (36.5 C) (Oral)   Ht 5\' 2"  (1.575 m)   Wt 166 lb 12.8 oz (75.7 kg)   LMP 04/14/2020 (Exact Date)   SpO2 98%   BMI 30.51 kg/m   Body mass index is 30.51 kg/m.  GENERAL: vitals reviewed and listed above, alert, oriented, appears well hydrated and in no acute distress HEENT: atraumatic, conjunctiva  clear, no obvious abnormalities on inspection of external nose and earsNECK: no obvious masses on inspection palpation  Skin  distal extremities with linear rash  red  no streaking   non on face or trunk CV: HRRR, no clubbing cyanosis or  peripheral edema nl cap refill  MS: moves all extremities without noticeable focal  abnormality PSYCH: pleasant and cooperative, no obvious depression or anxiety Lab Results  Component Value Date   WBC 7.5 06/06/2022   HGB 13.9 06/06/2022   HCT 40.8 06/06/2022   PLT 346.0 06/06/2022   GLUCOSE 103 (H) 06/06/2022   CHOL 290 (H) 06/06/2022   TRIG 196.0 (H) 06/06/2022   HDL 44.70 06/06/2022   LDLDIRECT 173.7 02/20/2011   LDLCALC 206 (H) 06/06/2022   ALT 21 06/06/2022   AST 24 06/06/2022   NA 139 06/06/2022   K 4.0 06/06/2022   CL 104 06/06/2022   CREATININE  0.80 06/06/2022   BUN 12 06/06/2022   CO2 25 06/06/2022   TSH 1.13 06/06/2022   HGBA1C 5.8  06/06/2022   BP Readings from Last 3 Encounters:  08/19/23 96/76  09/12/22 118/70  08/01/22 106/66    ASSESSMENT AND PLAN:  Discussed the following assessment and plan:  Rhus dermatitis  Bereavement  Hypothyroidism, unspecified type - Plan: TSH, Lipid panel, Hemoglobin A1c, Comprehensive metabolic panel with GFR  Medication management - Plan: TSH, Lipid panel, Hemoglobin A1c, Comprehensive metabolic panel with GFR  Metabolic syndrome - Plan: TSH, Lipid panel, Hemoglobin A1c, Comprehensive metabolic panel with GFR  Hyperlipidemia, unspecified hyperlipidemia type - Plan: TSH, Lipid panel, Hemoglobin A1c, Comprehensive metabolic panel with GFR  Fatty liver - Plan: TSH, Lipid panel, Hemoglobin A1c, Comprehensive metabolic panel with GFR  Family history of vascular disease - Plan: TSH, Lipid panel, Hemoglobin A1c, Comprehensive metabolic panel with GFR  Screening for tuberculosis - Plan: QuantiFERON-TB Gold Plus Form  signed for time out April to  mid may  copy  for record . Topical  potent steroid trial for  CD PI  if spreading too much  consider systemic prednisone  . Lab non fasting today ( crackers) and plan cpe in summer    -Patient advised to return or notify health care team  if  new concerns arise.  Patient Instructions  Rash looks like contact dermatitis  from plant such as poison ivy   Use topical  potent steroid cream as planned  Lab today  Fmla form  Fu  CPE this summer .       Quandre Polinski K. Gregroy Dombkowski M.D.

## 2023-08-19 NOTE — Patient Instructions (Addendum)
 Rash looks like contact dermatitis  from plant such as poison ivy   Use topical  potent steroid cream as planned  Lab today  Fmla form  Fu  CPE this summer .

## 2023-08-20 ENCOUNTER — Other Ambulatory Visit (HOSPITAL_COMMUNITY): Payer: Self-pay

## 2023-08-20 ENCOUNTER — Telehealth: Payer: Self-pay

## 2023-08-20 LAB — LIPID PANEL
Cholesterol: 149 mg/dL (ref 0–200)
HDL: 51.7 mg/dL (ref >39.00)
LDL Cholesterol: 71 mg/dL (ref 0–99)
NonHDL: 96.92
Total CHOL/HDL Ratio: 3
Triglycerides: 132 mg/dL (ref 0.0–149.0)
VLDL: 26.4 mg/dL (ref 0.0–40.0)

## 2023-08-20 LAB — COMPREHENSIVE METABOLIC PANEL WITH GFR
ALT: 18 U/L (ref 0–35)
AST: 20 U/L (ref 0–37)
Albumin: 4.5 g/dL (ref 3.5–5.2)
Alkaline Phosphatase: 83 U/L (ref 39–117)
BUN: 13 mg/dL (ref 6–23)
CO2: 27 meq/L (ref 19–32)
Calcium: 9.1 mg/dL (ref 8.4–10.5)
Chloride: 106 meq/L (ref 96–112)
Creatinine, Ser: 0.68 mg/dL (ref 0.40–1.20)
GFR: 99.11 mL/min (ref >60.00)
Glucose, Bld: 69 mg/dL — ABNORMAL LOW (ref 70–99)
Potassium: 4.1 meq/L (ref 3.5–5.1)
Sodium: 140 meq/L (ref 135–145)
Total Bilirubin: 0.8 mg/dL (ref 0.2–1.2)
Total Protein: 7.5 g/dL (ref 6.0–8.3)

## 2023-08-20 LAB — TSH: TSH: 1.48 u[IU]/mL (ref 0.35–5.50)

## 2023-08-20 LAB — HEMOGLOBIN A1C: Hgb A1c MFr Bld: 5.9 % (ref 4.6–6.5)

## 2023-08-20 NOTE — Telephone Encounter (Signed)

## 2023-08-21 ENCOUNTER — Ambulatory Visit: Payer: Self-pay | Admitting: Internal Medicine

## 2023-08-21 LAB — QUANTIFERON-TB GOLD PLUS
Mitogen-NIL: 8.5 [IU]/mL
NIL: 0.04 [IU]/mL
QuantiFERON-TB Gold Plus: NEGATIVE
TB1-NIL: <0 [IU]/mL
TB2-NIL: 0.01 [IU]/mL

## 2023-08-21 NOTE — Progress Notes (Signed)
 Results are in range or favorable   continue meds and see you in the summer

## 2023-08-22 ENCOUNTER — Telehealth: Payer: Self-pay

## 2023-08-22 NOTE — Telephone Encounter (Signed)
 Pharmacy Patient Advocate Encounter   Received notification from Onbase that prior authorization for Mounjaro  2.5mg /0.8ml is required/requested.   Insurance verification completed.   The patient is insured through [facility] .   Ozempic /Mounjaro  is approved exclusively as an adjunct to diet and exercise to improve glycemic control in adults with type 2 diabetes mellitus. A review of patient's medical chart reveals no documented diagnosis of type 2 diabetes or an A1C indicative of diabetes. Therefore, they do not currently meet the criteria for prior authorization of this medication. If clinically appropriate, alternative options such as Saxenda, Zepbound , or Wegovy  may be considered for this patient.

## 2023-08-26 NOTE — Telephone Encounter (Signed)
 Attempted to reach pt. Left a voicemail to call us back.

## 2023-08-26 NOTE — Telephone Encounter (Signed)
 Spoke to pt and update pt of prior authorization on Mounjaro .   Advise pt to contact her insurance to see if they cover Saxenda, Zepbound  and Wegovy  for weightloss. Pt verbalized understanding.

## 2023-09-01 ENCOUNTER — Other Ambulatory Visit: Payer: Self-pay | Admitting: Internal Medicine

## 2023-09-17 ENCOUNTER — Other Ambulatory Visit: Payer: Self-pay | Admitting: Internal Medicine

## 2023-09-17 ENCOUNTER — Other Ambulatory Visit: Payer: Self-pay

## 2023-09-17 MED ORDER — FLUOXETINE HCL 10 MG PO CAPS: 10.0000 mg | ORAL_CAPSULE | Freq: Every day | ORAL | 4 refills | Status: AC

## 2023-09-17 NOTE — Telephone Encounter (Signed)
 Copied from CRM [id number]. Topic: Clinical - Medication Refill >> Sep 17, 2023 11:22 AM Gennette ORN wrote: Medication: FLUoxetine  (PROZAC ) 10 MG capsule  Has the patient contacted their pharmacy? Yes (Agent: If no, request that the patient contact the pharmacy for the refill. If patient does not wish to contact the pharmacy document the reason why and proceed with request.) (Agent: If yes, when and what did the pharmacy advise?)  This is the patient's preferred pharmacy:   [id number] [place], [place] - [address] [place] [address] Phone: [phone] Fax: [phone]  Is this the correct pharmacy for this prescription? Yes If no, delete pharmacy and type the correct one.   Has the prescription been filled recently? Yes  Is the patient out of the medication? Yes  Has the patient been seen for an appointment in the last year OR does the patient have an upcoming appointment? Yes  Can we respond through MyChart? Yes  Agent: Please be advised that Rx refills may take up to 3 business days. We ask that you follow-up with your pharmacy.

## 2023-10-04 ENCOUNTER — Emergency Department (HOSPITAL_BASED_OUTPATIENT_CLINIC_OR_DEPARTMENT_OTHER)
Admission: EM | Admit: 2023-10-04 | Discharge: 2023-10-04 | Disposition: A | Discharge status: Home / Self Care | Attending: Emergency Medicine | Admitting: Emergency Medicine

## 2023-10-04 ENCOUNTER — Encounter (HOSPITAL_BASED_OUTPATIENT_CLINIC_OR_DEPARTMENT_OTHER): Payer: Self-pay

## 2023-10-04 DIAGNOSIS — R21 Rash and other nonspecific skin eruption: Secondary | ICD-10-CM | POA: Insufficient documentation

## 2023-10-04 MED ORDER — DIPHENHYDRAMINE HCL 25 MG PO CAPS
50.0000 mg | ORAL_CAPSULE | Freq: Once | ORAL | Status: AC
  Administered 2023-10-04: 50 mg via ORAL
  Filled 2023-10-04: qty 2

## 2023-10-04 MED ORDER — KETOROLAC TROMETHAMINE 15 MG/ML IJ SOLN
15.0000 mg | Freq: Once | INTRAMUSCULAR | Status: AC
  Administered 2023-10-04: 15 mg via INTRAMUSCULAR
  Filled 2023-10-04: qty 1

## 2023-10-04 MED ORDER — PROMETHAZINE HCL 25 MG PO TABS
25.0000 mg | ORAL_TABLET | Freq: Once | ORAL | Status: AC
  Administered 2023-10-04: 25 mg via ORAL
  Filled 2023-10-04: qty 1

## 2023-10-04 MED ORDER — FAMOTIDINE 20 MG PO TABS
40.0000 mg | ORAL_TABLET | Freq: Once | ORAL | Status: AC
  Administered 2023-10-04: 40 mg via ORAL
  Filled 2023-10-04: qty 2

## 2023-10-04 MED ORDER — PREDNISONE 50 MG PO TABS
60.0000 mg | ORAL_TABLET | Freq: Once | ORAL | Status: AC
  Administered 2023-10-04: 60 mg via ORAL
  Filled 2023-10-04: qty 1

## 2023-10-04 MED ORDER — ONDANSETRON HCL 4 MG/2ML IJ SOLN
4.0000 mg | Freq: Once | INTRAMUSCULAR | Status: AC
  Administered 2023-10-04: 4 mg via INTRAVENOUS
  Filled 2023-10-04: qty 2

## 2023-10-04 MED ORDER — PREDNISONE 20 MG PO TABS: ORAL_TABLET | ORAL | 0 refills | Status: AC

## 2023-10-04 NOTE — Discharge Instructions (Addendum)
 Follow up with your family doc in the office.  Please call them on Monday to let them know about your visit and see when they want to see you.  Return for nausea vomiting difficulty breathing if you pass out or feel like you cannot pass out.  Take an antihistamine regularly for the next week.

## 2023-10-04 NOTE — ED Notes (Signed)
 Pt attempted to leave. Stated that the nausea became worse and she was feeling dizzy. Pt was escorted back to room and provider made aware and went to see pt

## 2023-10-04 NOTE — ED Provider Notes (Signed)
 [place] EMERGENCY DEPARTMENT AT [facility] Provider Note   CSN: [id number] Arrival date & time: 10/04/23  9864     Patient presents with: Allergic Reaction   Karen Fleming is a 53 y.o. female.   53 yo F with diffuse body erythema and a sensation like her face is swollen and her ears are clogged and her nose is clogged.  This started a couple hours ago.  She thinks it was triggered by eating meat.  She has no history of meat allergy.  Denied any wheezing has had nausea but denies vomiting or diarrhea.  Denies abdominal pain.  She tried some Allegra at home but without improvement.   Allergic Reaction      Prior to Admission medications   Medication Sig Start Date End Date Taking? Authorizing Provider  predniSONE  (DELTASONE ) 20 MG tablet 2 tabs po daily x 4 days 10/04/23  Yes Emil, Dorrine Montone, DO  atorvastatin  (LIPITOR) 20 MG tablet TAKE 1 TABLET BY MOUTH EVERY DAY 09/02/23   Panosh, Wanda K, MD  fluocinonide  cream (LIDEX ) 0.05 % Apply 1 Application topically 2 (two) times daily. To poison ivy rash 08/19/23   Panosh, Wanda K, MD  FLUoxetine  (PROZAC ) 10 MG capsule Take 1 capsule (10 mg total) by mouth daily. Decrease dosage from 20 mg 09/17/23   Panosh, Wanda K, MD  levothyroxine  (SYNTHROID ) 50 MCG tablet TAKE 1 TABLET BY MOUTH EVERY DAY 08/19/23   Panosh, Wanda K, MD  Semaglutide , 1 MG/DOSE, 4 MG/3ML SOPN Inject 1 mg into the skin once a week. 08/19/23   Panosh, Wanda K, MD    Allergies: Patient has no known allergies.    Review of Systems  Updated Vital Signs BP 117/66   Pulse 63   Temp 97.9 F (36.6 C) (Oral)   Resp 20   Ht 5' 2 (1.575 m)   Wt 77.1 kg   LMP 04/14/2020 (Exact Date)   SpO2 92%   BMI 31.09 kg/m   Physical Exam Vitals and nursing note reviewed.  Constitutional:      General: She is not in acute distress.    Appearance: She is well-developed. She is not diaphoretic.  HENT:     Head: Normocephalic and atraumatic.  Eyes:     Pupils:  Pupils are equal, round, and reactive to light.  Cardiovascular:     Rate and Rhythm: Normal rate and regular rhythm.     Heart sounds: No murmur heard.    No friction rub. No gallop.  Pulmonary:     Effort: Pulmonary effort is normal.     Breath sounds: No wheezing or rales.  Abdominal:     General: There is no distension.     Palpations: Abdomen is soft.     Tenderness: There is no abdominal tenderness.  Musculoskeletal:        General: No tenderness.     Cervical back: Normal range of motion and neck supple.  Skin:    General: Skin is warm and dry.     Comments: Diffuse erythema.  Neurological:     Mental Status: She is alert and oriented to person, place, and time.  Psychiatric:        Behavior: Behavior normal.     (all labs ordered are listed, but only abnormal results are displayed) Labs Reviewed - No data to display  EKG: None  Radiology: No results found.   Procedures   Medications Ordered in the ED  predniSONE  (DELTASONE ) tablet 60 mg (60 mg  Oral Given 10/04/23 0218)  diphenhydrAMINE  (BENADRYL ) capsule 50 mg (50 mg Oral Given 10/04/23 0218)  famotidine  (PEPCID ) tablet 40 mg (40 mg Oral Given 10/04/23 0218)                                    Medical Decision Making Risk Prescription drug management.   53 yo F with concern for possible allergic reaction.  Patient does have diffuse erythema.  No obvious secondary signs of anaphylaxis.  Will treat with oral medications.  Observe in the ED.  Reassess.  Patient felt like her rash had improved but she developed some tenseness to her neck and felt like she was shaking all over.  She does seem a bit tremulous though seems distractible.  No obvious abnormalities of vital signs.  I think this is unlikely to be serotonin syndrome or neuroleptic malignant syndrome.  I discussed further observation with her in the emergency department.  She is currently declining and would like to try and go home.  Encouraged her to  follow-up with her family doctor in the office.  3:21 AM:  I have discussed the diagnosis/risks/treatment options with the patient and family.  Evaluation and diagnostic testing in the emergency department does not suggest an emergent condition requiring admission or immediate intervention beyond what has been performed at this time.  They will follow up with PCP. We also discussed returning to the ED immediately if new or worsening sx occur. We discussed the sx which are most concerning (e.g., sudden worsening pain, fever, inability to tolerate by mouth, sob, n/v/d, syncope) that necessitate immediate return. Medications administered to the patient during their visit and any new prescriptions provided to the patient are listed below.  Medications given during this visit Medications  predniSONE  (DELTASONE ) tablet 60 mg (60 mg Oral Given 10/04/23 0218)  diphenhydrAMINE  (BENADRYL ) capsule 50 mg (50 mg Oral Given 10/04/23 0218)  famotidine  (PEPCID ) tablet 40 mg (40 mg Oral Given 10/04/23 0218)     The patient appears reasonably screen and/or stabilized for discharge and I doubt any other medical condition or other [facility] requiring further screening, evaluation, or treatment in the ED at this time prior to discharge.       Final diagnoses:  Rash    ED Discharge Orders          Ordered    predniSONE  (DELTASONE ) 20 MG tablet        10/04/23 0318               Emil Share, DO 10/04/23 0321

## 2023-10-04 NOTE — ED Notes (Signed)
 Pt complaining of shaking and neck pain, provider made aware.

## 2023-10-04 NOTE — ED Triage Notes (Addendum)
 Pt POV d/t possible allergic reaction.  Pt is itching all over and erythema as well.  She states she feels everything is swelling in head - tongue, mouth nose.  Pt states she ate beef that she does not normally eat at 2030.  She began having symptoms 3 hours ago.   Pt took 3 Allegra PTA

## 2023-11-06 ENCOUNTER — Encounter: Admitting: Internal Medicine

## 2023-11-06 NOTE — Progress Notes (Deleted)
 No chief complaint on file.   HPI: Patient  Karen Fleming  53 y.o. comes in today for Preventive Health Care visit   Health Maintenance  Topic Date Due   HIV Screening  Never done   Hepatitis B Vaccines (1 of 3 - 19+ 3-dose series) Never done   Pneumococcal Vaccine: 50+ Years (1 of 1 - PCV) Never done   MAMMOGRAM  01/06/2022   Cervical Cancer Screening (HPV/Pap Cotest)  12/06/2022   COVID-19 Vaccine (7 - Pfizer risk 2024-25 season) 08/15/2023   INFLUENZA VACCINE  10/24/2023   Colonoscopy  08/18/2026   DTaP/Tdap/Td (3 - Td or Tdap) 05/03/2031   Hepatitis C Screening  Completed   Zoster Vaccines- Shingrix  Completed   HPV VACCINES  Aged Out   Meningococcal B Vaccine  Aged Out   Health Maintenance Review LIFESTYLE:  Exercise:   Tobacco/ETS: Alcohol:  Sugar beverages: Sleep: Drug use: no HH of  Work:    ROS:  GEN/ HEENT: No fever, significant weight changes sweats headaches vision problems hearing changes, CV/ PULM; No chest pain shortness of breath cough, syncope,edema  change in exercise tolerance. GI /GU: No adominal pain, vomiting, change in bowel habits. No blood in the stool. No significant GU symptoms. SKIN/HEME: ,no acute skin rashes suspicious lesions or bleeding. No lymphadenopathy, nodules, masses.  NEURO/ PSYCH:  No neurologic signs such as weakness numbness. No depression anxiety. IMM/ Allergy: No unusual infections.  Allergy .   REST of 12 system review negative except as per HPI   Past Medical History:  Diagnosis Date   H. pylori infection 2019   Per dr Kristie   HELLP (hemolytic anemia/elev liver enzymes/low platelets in pregnancy)    c-section 2007   Hemolytic anemia, elevated liver enzymes, low platelets in pregnancy    Hives of unknown origin 03/24/2017   on going since  summer 17 uncertain cause no major med changes  to explain . no systemixc sx allergy consult referral    Hyperlipidemia    Hypothyroidism    Pancreatitis    2000 and  2005   Panic attacks    on meds pre pregnancy/hx    Past Surgical History:  Procedure Laterality Date   APPENDECTOMY  2001   CESAREAN SECTION  2007   CHOLECYSTECTOMY N/A 02/21/2020   Procedure: LAPAROSCOPIC CHOLECYSTECTOMY;  Surgeon: Vernetta Berg, MD;  Location: [facility];  Service: General;  Laterality: N/A;    Family History  Problem Relation Age of Onset   Heart attack Father        in 57s    Hyperlipidemia Father    Hypertension Father        gm and gf   CVA Father        arrynthmia ? af  age 18    Throat cancer Father    Arrhythmia Father    Diabetes Other        gm and gf   Arthritis Other        gp ? OA   Hyperlipidemia Mother    CVA Mother    Colon cancer Neg Hx    Stomach cancer Neg Hx     Social History   Socioeconomic History   Marital status: Divorced    Spouse name: Not on file   Number of children: Not on file   Years of education: Not on file   Highest education level: Not on file  Occupational History   Not on file  Tobacco  Use   Smoking status: Never   Smokeless tobacco: Never  Vaping Use   Vaping status: Never Used  Substance and Sexual Activity   Alcohol use: No   Drug use: No   Sexual activity: Not on file  Other Topics Concern   Not on file  Social History Narrative   HH of 3 pet  Dog     58 yo son child at home [place]   No ets   No caffeine   Drink milk 2-3 x per day    reg exercise    [place]         Social Drivers of Health   Financial Resource Strain: Low Risk  (08/01/2022)   Overall Financial Resource Strain (CARDIA)    Difficulty of Paying Living Expenses: Not hard at all  Food Insecurity: No Food Insecurity (08/01/2022)   Hunger Vital Sign    Worried About Running Out of Food in the Last Year: Never true    Ran Out of Food in the Last Year: Never true  Transportation Needs: No Transportation Needs (08/01/2022)   PRAPARE - Administrator, Civil Service (Medical): No    Lack  of Transportation (Non-Medical): No  Physical Activity: Insufficiently Active (08/01/2022)   Exercise Vital Sign    Days of Exercise per Week: 3 days    Minutes of Exercise per Session: 30 min  Stress: No Stress Concern Present (08/01/2022)   [employer] of Occupational Health - Occupational Stress Questionnaire    Feeling of Stress : Not at all  Social Connections: Socially Isolated (08/01/2022)   Social Connection and Isolation Panel    Frequency of Communication with Friends and Family: More than three times a week    Frequency of Social Gatherings with Friends and Family: Never    Attends Religious Services: Never    Database administrator or Organizations: No    Attends Engineer, structural: Never    Marital Status: Divorced    Outpatient Medications Prior to Visit  Medication Sig Dispense Refill   atorvastatin  (LIPITOR) 20 MG tablet TAKE 1 TABLET BY MOUTH EVERY DAY 90 tablet 1   fluocinonide  cream (LIDEX ) 0.05 % Apply 1 Application topically 2 (two) times daily. To poison ivy rash 30 g 2   FLUoxetine  (PROZAC ) 10 MG capsule Take 1 capsule (10 mg total) by mouth daily. Decrease dosage from 20 mg 90 capsule 4   levothyroxine  (SYNTHROID ) 50 MCG tablet TAKE 1 TABLET BY MOUTH EVERY DAY 90 tablet 0   predniSONE  (DELTASONE ) 20 MG tablet 2 tabs po daily x 4 days 8 tablet 0   Semaglutide , 1 MG/DOSE, 4 MG/3ML SOPN Inject 1 mg into the skin once a week. 6 mL 3   No facility-administered medications prior to visit.     EXAM:  LMP 04/14/2020 (Exact Date)   There is no height or weight on file to calculate BMI. Wt Readings from Last 3 Encounters:  10/04/23 170 lb (77.1 kg)  08/19/23 166 lb 12.8 oz (75.7 kg)  10/09/22 170 lb (77.1 kg)    Physical Exam: Vital signs reviewed HZW:Uypd is a well-developed well-nourished alert cooperative    who appearsr stated age in no acute distress.  HEENT: normocephalic atraumatic , Eyes: PERRL EOM's full, conjunctiva clear, Nares: paten,t  no deformity discharge or tenderness., Ears: no deformity EAC's clear TMs with normal landmarks. Mouth: clear OP, no lesions, edema.  Moist mucous membranes. Dentition in adequate repair. NECK: supple without  masses, thyromegaly or bruits. CHEST/PULM:  Clear to auscultation and percussion breath sounds equal no wheeze , rales or rhonchi. No chest wall deformities or tenderness. Breast: normal by inspection . No dimpling, discharge, masses, tenderness or discharge . CV: PMI is nondisplaced, S1 S2 no gallops, murmurs, rubs. Peripheral pulses are full without delay.No JVD .  ABDOMEN: Bowel sounds normal nontender  No guard or rebound, no hepato splenomegal no CVA tenderness.  No hernia. Extremtities:  No clubbing cyanosis or edema, no acute joint swelling or redness no focal atrophy NEURO:  Oriented x3, cranial nerves 3-12 appear to be intact, no obvious focal weakness,gait within normal limits no abnormal reflexes or asymmetrical SKIN: No acute rashes normal turgor, color, no bruising or petechiae. PSYCH: Oriented, good eye contact, no obvious depression anxiety, cognition and judgment appear normal. LN: no cervical axillary adenopathy  Lab Results  Component Value Date   WBC 7.5 06/06/2022   HGB 13.9 06/06/2022   HCT 40.8 06/06/2022   PLT 346.0 06/06/2022   GLUCOSE 69 (L) 08/19/2023   CHOL 149 08/19/2023   TRIG 132.0 08/19/2023   HDL 51.70 08/19/2023   LDLDIRECT 173.7 02/20/2011   LDLCALC 71 08/19/2023   ALT 18 08/19/2023   AST 20 08/19/2023   NA 140 08/19/2023   K 4.1 08/19/2023   CL 106 08/19/2023   CREATININE 0.68 08/19/2023   BUN 13 08/19/2023   CO2 27 08/19/2023   TSH 1.48 08/19/2023   HGBA1C 5.9 08/19/2023    BP Readings from Last 3 Encounters:  10/04/23 107/68  08/19/23 96/76  09/12/22 118/70    Lab results/plan  reviewed with patient   ASSESSMENT AND PLAN:  Discussed the following assessment and plan:    ICD-10-CM   1. Visit for preventive health examination   Z00.00     2. Hypothyroidism, unspecified type  E03.9     3. Hyperlipidemia, unspecified hyperlipidemia type  E78.5      No follow-ups on file.  Patient Care Team: Elaisha Zahniser, Apolinar POUR, MD as PCP - General Haygood, Shanda SQUIBB, MD (Inactive) (Obstetrics and Gynecology) Kristie Lamprey, MD as Consulting Physician (Gastroenterology) There are no Patient Instructions on file for this visit.  Olyn Landstrom K. Avant Printy M.D.

## 2023-11-19 ENCOUNTER — Other Ambulatory Visit: Payer: Self-pay | Admitting: Internal Medicine

## 2024-02-24 ENCOUNTER — Other Ambulatory Visit: Payer: Self-pay | Admitting: Internal Medicine
